# Patient Record
Sex: Female | Born: 1950
Health system: Southern US, Community
[De-identification: ages and names within clinical notes are randomized; demographics above are authoritative.]

## PROBLEM LIST (undated history)

## (undated) DIAGNOSIS — M810 Age-related osteoporosis without current pathological fracture: Secondary | ICD-10-CM

## (undated) DIAGNOSIS — M7989 Other specified soft tissue disorders: Secondary | ICD-10-CM

## (undated) DIAGNOSIS — R112 Nausea with vomiting, unspecified: Secondary | ICD-10-CM

## (undated) DIAGNOSIS — I739 Peripheral vascular disease, unspecified: Secondary | ICD-10-CM

## (undated) DIAGNOSIS — R519 Headache, unspecified: Secondary | ICD-10-CM

## (undated) DIAGNOSIS — M1990 Unspecified osteoarthritis, unspecified site: Secondary | ICD-10-CM

## (undated) DIAGNOSIS — Z8719 Personal history of other diseases of the digestive system: Secondary | ICD-10-CM

## (undated) DIAGNOSIS — M419 Scoliosis, unspecified: Secondary | ICD-10-CM

## (undated) DIAGNOSIS — Z87898 Personal history of other specified conditions: Secondary | ICD-10-CM

## (undated) DIAGNOSIS — Z9889 Other specified postprocedural states: Secondary | ICD-10-CM

## (undated) DIAGNOSIS — R51 Headache: Secondary | ICD-10-CM

## (undated) DIAGNOSIS — Z8489 Family history of other specified conditions: Secondary | ICD-10-CM

## (undated) DIAGNOSIS — R42 Dizziness and giddiness: Secondary | ICD-10-CM

## (undated) DIAGNOSIS — K219 Gastro-esophageal reflux disease without esophagitis: Secondary | ICD-10-CM

## (undated) HISTORY — PX: DEEP AXILLARY SENTINEL NODE BIOPSY / EXCISION: SUR130

## (undated) HISTORY — DX: Gastro-esophageal reflux disease without esophagitis: K21.9

## (undated) HISTORY — PX: JOINT REPLACEMENT: SHX530

## (undated) HISTORY — PX: BACK SURGERY: SHX140

## (undated) HISTORY — DX: Other specified soft tissue disorders: M79.89

## (undated) HISTORY — PX: OTHER SURGICAL HISTORY: SHX169

## (undated) HISTORY — PX: MENISCUS REPAIR: SHX5179

## (undated) HISTORY — DX: Unspecified osteoarthritis, unspecified site: M19.90

## (undated) HISTORY — PX: HEMORROIDECTOMY: SUR656

---

## 1990-10-06 HISTORY — PX: CHOLECYSTECTOMY: SHX55

## 1995-10-07 HISTORY — PX: BREAST REDUCTION SURGERY: SHX8

## 1996-10-06 HISTORY — PX: REDUCTION MAMMAPLASTY: SUR839

## 2006-10-06 HISTORY — PX: LASER ABLATION: SHX1947

## 2007-01-21 ENCOUNTER — Emergency Department (HOSPITAL_COMMUNITY): Admission: EM | Admit: 2007-01-21 | Discharge: 2007-01-21 | Payer: Self-pay | Admitting: Emergency Medicine

## 2007-01-21 ENCOUNTER — Encounter: Payer: Self-pay | Admitting: Vascular Surgery

## 2007-01-21 ENCOUNTER — Ambulatory Visit: Payer: Self-pay | Admitting: Vascular Surgery

## 2007-03-10 ENCOUNTER — Emergency Department (HOSPITAL_COMMUNITY): Admission: EM | Admit: 2007-03-10 | Discharge: 2007-03-10 | Payer: Self-pay | Admitting: Emergency Medicine

## 2007-03-10 ENCOUNTER — Encounter (INDEPENDENT_AMBULATORY_CARE_PROVIDER_SITE_OTHER): Payer: Self-pay | Admitting: Emergency Medicine

## 2007-03-10 ENCOUNTER — Ambulatory Visit: Payer: Self-pay | Admitting: Vascular Surgery

## 2007-04-05 ENCOUNTER — Ambulatory Visit: Payer: Self-pay | Admitting: Vascular Surgery

## 2007-06-03 ENCOUNTER — Other Ambulatory Visit: Admission: RE | Admit: 2007-06-03 | Discharge: 2007-06-03 | Payer: Self-pay | Admitting: Family Medicine

## 2007-06-16 ENCOUNTER — Encounter: Admission: RE | Admit: 2007-06-16 | Discharge: 2007-06-16 | Payer: Self-pay | Admitting: *Deleted

## 2007-07-23 ENCOUNTER — Ambulatory Visit: Payer: Self-pay | Admitting: Vascular Surgery

## 2008-02-18 ENCOUNTER — Ambulatory Visit: Payer: Self-pay | Admitting: Vascular Surgery

## 2008-03-28 ENCOUNTER — Encounter: Admission: RE | Admit: 2008-03-28 | Discharge: 2008-03-28 | Payer: Self-pay | Admitting: Family Medicine

## 2008-06-30 ENCOUNTER — Ambulatory Visit: Payer: Self-pay | Admitting: Vascular Surgery

## 2008-08-01 ENCOUNTER — Encounter: Admission: RE | Admit: 2008-08-01 | Discharge: 2008-08-01 | Payer: Self-pay | Admitting: Family Medicine

## 2009-08-02 ENCOUNTER — Encounter: Admission: RE | Admit: 2009-08-02 | Discharge: 2009-08-02 | Payer: Self-pay | Admitting: Family Medicine

## 2009-10-18 ENCOUNTER — Ambulatory Visit (HOSPITAL_COMMUNITY): Admission: RE | Admit: 2009-10-18 | Discharge: 2009-10-18 | Payer: Self-pay | Admitting: Gastroenterology

## 2010-07-17 ENCOUNTER — Encounter: Admission: RE | Admit: 2010-07-17 | Discharge: 2010-07-17 | Payer: Self-pay | Admitting: Family Medicine

## 2010-08-06 ENCOUNTER — Encounter: Admission: RE | Admit: 2010-08-06 | Discharge: 2010-08-06 | Payer: Self-pay | Admitting: Family Medicine

## 2010-08-09 ENCOUNTER — Encounter: Admission: RE | Admit: 2010-08-09 | Discharge: 2010-08-09 | Payer: Self-pay | Admitting: Rheumatology

## 2011-02-18 NOTE — Assessment & Plan Note (Signed)
OFFICE VISIT   Meghan Avila, Meghan Avila  DOB:  10-Jan-1951                                       02/18/2008  MVHQI#:[id number]   The patient presents today for continuing discussion regarding swelling  and discomfort in both lower extremities.  I have seen her in the past.  She has had prior treatment of her left great saphenous vein in [place]  on 12/25/2006.  I had seen her for a duplex followup of this and she did  have reasonable treatment.  She did have a small area of recannulization  at the level of her left knee.  She complains now of bilateral leg  swelling, she also reports cramping sensation in her left lateral thigh  and right popliteal area.  She does not have any evidence of venous  varicosities and does have moderate pitting edema bilaterally.  She has  not been wearing her compression garments and has not been taking  diuretic therapy.  Her history is otherwise unchanged.   PHYSICAL EXAM:  She does have 2+ dorsalis pedis pulses bilaterally.  She  underwent duplex imaging by me and this revealed no evidence of any  varicosities in the area of her concern in her lateral left thigh.  She  had normal appearing popliteal vessel on the right, normal appearing  popliteal vein on the right and normal small saphenous vein on the  right.  I explained the significance of this to Meghan Avila.  I feel that  she is going to require diuretic treatment for her swelling.  She is  somewhat resistant to this.  I also explained the importance of her  compression garments.  She is disappointed in not having better relief  following her treatment from her vein treatment in [place].  I explained  it is somewhat difficult to determine what the outcome will be with  these procedures at times but that she does appear to have a reasonable  outcome from the ablation.  She will continue her followup with her  medical doctor.  She is seeing Dr. Gweneth Dimitri since Dr. Vedia Coffer has  left the area.  She will see [place] again on an as-needed basis.   Larina Earthly, M.D.  Electronically Signed   TFE/MEDQ  D:  02/18/2008  T:  02/21/2008  Job:  [id number]   cc:   Carma Leaven, DO  Pam Drown, M.D.

## 2011-02-18 NOTE — Procedures (Signed)
LOWER EXTREMITY VENOUS REFLUX EXAM   INDICATION:  Status post laser ablation of left GSV and SSV, still with  pain and swelling.   EXAM:  Using color-flow imaging and pulse Doppler spectral analysis, the  left common femoral, superficial femoral, popliteal, posterior tibial,  greater and lesser saphenous veins are evaluated.  There is no evidence  suggesting deep venous insufficiency in the left lower extremity.   The left saphenofemoral junction is competent.  The left GSV is not  competent in a short segment near the knee.   The left proximal short saphenous vein demonstrates competency.   GSV Diameter (used if found to be incompetent only)                                            Right    Left  Proximal Greater Saphenous Vein           cm       cm  Proximal-to-mid-thigh                     cm       cm  Mid thigh                                 cm       cm  Mid-distal thigh                          cm       cm  Distal thigh                              cm       cm  Knee                                      cm       cm   IMPRESSION:  1. The left greater saphenous vein reflux is identified near the knee,      however, saphenofemoral junction appears within normal limits.  2. Normal flow was noted at the saphenofemoral junction with the      proximal greater saphenous vein showing evidence of ablation and      only small branches visualized until near the knee where a short      segment with reflux was noted before it became more tortuous and      branching again.  3. The left greater saphenous vein is not aneurysmal.  4. The left greater saphenous vein is not tortuous.  5. The deep venous system is competent.  6. The left lesser saphenous vein is competent and appears to connect      into a vein of Giac[place]i.  7. No DVT noted in the left lower extremity.  8. Minimal chronic thrombus noted in a left greater saphenous vein and      varicose veins status post  ablation.   ___________________________________________  Larina Earthly, M.D.   AS/MEDQ  D:  07/23/2007  T:  07/25/2007  Job:  [id number]

## 2011-02-18 NOTE — Assessment & Plan Note (Signed)
OFFICE VISIT   Slager, Alyssia L  DOB:  Oct 12, 1950                                       07/23/2007  [id number]#:[id number]   The patient presents today for continued evaluation of her left leg  swelling.  I had seen her initially in 03/2007.  She had undergone a  left great and small saphenous vein ablation by Dr. Zackery Barefoot in  [place].  She continues to have swelling in her left leg and reports  this is not as significant as it was preprocedurally.  She does not have  any specific severe pain associated with this, but is irritated by the  continued swelling.  She is very compliant with knee-high graduated  compression stockings.   PHYSICAL EXAMINATION:  This is unchanged.  She does have edema more so  on her left than right leg.  She has 2+ dorsalis pedis pulses  bilaterally.   I have recommended that we proceed with a form duplex to rule out any  deep venous pathology and to recheck her surface veins.  This did reveal  great saphenous was ablated in the thigh with the slight veins still  patent at the saphenofemoral junction.  Fortunately, her deep venous  system was patent with no evidence of occlusion or valvular  incompetence.  I have reassured the patient with this.  I suspect that  she will continue to have swelling.  I am unclear as to the etiology of  this.  She will continue her compression garments, and will see [place] on an  as needed basis.   Larina Earthly, M.D.  Electronically Signed   TFE/MEDQ  D:  07/23/2007  T:  07/27/2007  Job:  [id number]

## 2011-02-18 NOTE — Procedures (Signed)
DUPLEX DEEP VENOUS EXAM - LOWER EXTREMITY   INDICATION:  Bilateral lower extremity edema.   HISTORY:  Edema:  Chronic bilateral lower extremity edema.  Trauma/Surgery:  Laser ablation of left greater saphenous vein in  [place].  Pain:  Bilateral lower extremity aching.  PE:  No.  Previous DVT:  No.  Anticoagulants:  No.  Other:  The patient was prescribed diuretics which she does not take.  She does not wear compression stockings.   DUPLEX EXAM:                CFV   SFV   PopV  PTV    GSV                R  L  R  L  R  L  R   L  R  L  Thrombosis    o  o  o  o  o  o  o   o  o  o  Spontaneous   +  +  +  +  +  +  +   +  +  +  Phasic        +  +  +  +  +  +  +   +  +  +  Augmentation  +  +  +  +  +  +  +   +  +  +  Compressible  +  +  +  +  +  +  +   +  +  +  Competent     +  +  +  +  +  +  +   +  +  +   Legend:  + - yes  o - no  p - partial  D - decreased    IMPRESSION:  1. No evidence of deep vein thrombus, superficial vein thrombus or      Baker's cyst bilaterally.  2. No evidence of significant venous reflux bilaterally.  3. Remaining portion of the left saphenofemoral junction is patent      with minimal reflux.      _____________________________  Larina Earthly, M.D.   MC/MEDQ  D:  06/30/2008  T:  07/01/2008  Job:  [id number]

## 2011-02-18 NOTE — Consult Note (Signed)
NEW PATIENT CONSULTATION   Offer, Meghan Avila  DOB:  01/28/1951                                       04/05/2007  [id number]#:[id number]   [name] presents today for evaluation of venous pathology.  She is  a 60 year old white female who is status post left great and small  saphenous vein laser ablation in March 2008, by Dr. Zackery Barefoot in  [place].  She has since moved to [place].  She has had continued  episodes of swelling in both legs and has had two venous duplexes to  evaluate this, to determine if she had deep venous thrombosis.  I have  copies of both of these, and both were negative.  She does have  continued difficulty with bilateral lower extremity swelling.  She  reports this as more pronounced, since her procedure on the left leg.  She denies any history of ulceration or hemorrhage.  Indication for her  laser ablation was for leg swelling.   PHYSICAL EXAMINATION:  GENERAL:  She does have pitting edema  bilaterally, slightly more so on the left leg than the right leg.  She  underwent hand-held duplex by me, showing successful sclerosing and  ablation of her left great and small saphenous vein on the left.  On the  right, she does have a large great saphenous vein with reflux present.  I had a long discussion with Ms. Kullman.  I had not seen any evidence of  any complications following her ablation.  I explained that, since she  is continuing to have bilateral leg swelling that the swelling may have  not been related to greater or lesser saphenous vein hypertension.  I  have recommended continued elevation, diuretic treatment, as she is on  Lasix 20 mg daily and compression when she can.  I do not feel that she  is in the risk for significant medical complications associated with  this.  Also of note, she does have numbness around the left calf, and I  explained that this is  frequently the case, following ablation of the small saphenous vein.   I  will see her again in 3 months for a continued discussion.   Larina Earthly, M.D.  Electronically Signed   TFE/MEDQ  D:  04/05/2007  T:  04/06/2007  Job:  [id number]   cc:   Carma Leaven, DO

## 2011-04-23 ENCOUNTER — Other Ambulatory Visit: Payer: Self-pay | Admitting: Family Medicine

## 2011-04-23 DIAGNOSIS — N644 Mastodynia: Secondary | ICD-10-CM

## 2011-05-01 ENCOUNTER — Ambulatory Visit
Admission: RE | Admit: 2011-05-01 | Discharge: 2011-05-01 | Disposition: A | Discharge status: Home / Self Care | Payer: 59 | Source: Ambulatory Visit | Attending: Family Medicine | Admitting: Family Medicine

## 2011-05-01 DIAGNOSIS — N644 Mastodynia: Secondary | ICD-10-CM

## 2011-06-16 ENCOUNTER — Ambulatory Visit (HOSPITAL_COMMUNITY)
Admission: RE | Admit: 2011-06-16 | Discharge: 2011-06-16 | Disposition: A | Discharge status: Home / Self Care | Payer: 59 | Source: Ambulatory Visit | Attending: Orthopedic Surgery | Admitting: Orthopedic Surgery

## 2011-06-16 DIAGNOSIS — M79609 Pain in unspecified limb: Secondary | ICD-10-CM

## 2011-06-16 DIAGNOSIS — M7989 Other specified soft tissue disorders: Secondary | ICD-10-CM

## 2011-07-21 ENCOUNTER — Ambulatory Visit (INDEPENDENT_AMBULATORY_CARE_PROVIDER_SITE_OTHER): Payer: Self-pay | Admitting: General Surgery

## 2011-07-31 ENCOUNTER — Ambulatory Visit (INDEPENDENT_AMBULATORY_CARE_PROVIDER_SITE_OTHER): Payer: Self-pay | Admitting: General Surgery

## 2011-08-08 ENCOUNTER — Ambulatory Visit (INDEPENDENT_AMBULATORY_CARE_PROVIDER_SITE_OTHER): Payer: Self-pay | Admitting: General Surgery

## 2011-08-11 ENCOUNTER — Ambulatory Visit (INDEPENDENT_AMBULATORY_CARE_PROVIDER_SITE_OTHER): Payer: Self-pay | Admitting: General Surgery

## 2011-08-11 ENCOUNTER — Ambulatory Visit (INDEPENDENT_AMBULATORY_CARE_PROVIDER_SITE_OTHER): Payer: 59 | Admitting: General Surgery

## 2011-08-11 ENCOUNTER — Encounter (INDEPENDENT_AMBULATORY_CARE_PROVIDER_SITE_OTHER): Payer: Self-pay | Admitting: General Surgery

## 2011-08-11 VITALS — BP 128/86 | HR 64 | Temp 97.8°F | Resp 16 | Ht 61.0 in | Wt 152.1 lb

## 2011-08-11 DIAGNOSIS — I890 Lymphedema, not elsewhere classified: Secondary | ICD-10-CM | POA: Insufficient documentation

## 2011-08-11 NOTE — Patient Instructions (Signed)
Breast Problems and Self-Exam Completing monthly breast exams may pick up problems early and save lives. There can be numerous causes of swelling, tenderness or lumps in the breasts. Some of these causes are:   Fibrocystic breast syndrome (noncancerous lumps). This is the most common cause of lumps in the breast.   Fibroadenoma breast tumors of unknown cause. These are noncancerous (benign) lumps.   Benign fatty tumors (lipomas).   Cancer of the breast.  By doing monthly breast exams, you get to know how your breasts feel and how they can change from month to month. This allows you to notice changes early. It can also offer you some reassurance that your breast health is good.  BREAST SELF-EXAM There are a few points to follow when doing a thorough breast exam. The best time to examine your breasts is 5 to 7 days after your menstrual period is over. During menstruation, the breasts are lumpier, and it may be more difficult to pick up changes. If you do not menstruate, have reached menopause, or had a hysterectomy (uterus removal), examine your breasts the first day of every month. After 3 to 4 months, you will become more familiar with the variations of your breasts and more comfortable with the exam.  Perform your breast exam monthly. Keep a written record with breast changes or normal findings for each breast. This makes it easier to be sure of changes, so you do not need to depend only on memory for size, tenderness, or location. Try to do the exam at the same time each month, and write down where you are in your menstrual cycle, if you are still menstruating.   Look at your breasts. Stand in front of a mirror with your hands clasped behind your head. Tighten your chest muscles and look for asymmetry. This means a difference in shape or contour from one breast to the other, such as puckers, dips or bumps. Also, look for skin changes.   Lean forward with your hands on your hips. Again, look for  symmetry and skin changes.   While showering, soap the breasts. Then, carefully feel the breasts with your fingertips, while holding the other arm (on the side of the breast you are examining) over your head. Do this with each breast, carefully feeling for lumps or changes. Typically, a circular motion with moderate fingertip pressure should be used.   Repeat this exam while lying on your back. Put your arm behind your head and a pillow under your shoulders. Again, use your fingertips to examine both breasts, feeling for lumps and thickening. Begin at the top of your breast, and go clockwise around the whole breast.   At the end of your exam, gently squeeze each nipple to see if there is any drainage of fluids. Look for nipple changes, dimpling, or redness.   Lastly, examine the upper chest and collarbone (clavicle) areas, and in your armpits.  It is not necessary to be alarmed if you find a breast lump. Most of them are not cancerous. However, it is necessary to see your caregiver if a lump is found, in order to have it looked at. Document Released: 09/22/2005 Document Revised: 06/04/2011 Document Reviewed: 01/09/2009 [employer] Patient Information 2012 [employer]. 

## 2011-08-11 NOTE — Progress Notes (Signed)
Subjective:     Patient ID: LIANA CAMERER, female   DOB: 05/03/1951, 60 y.o.   MRN: [id number]  HPI This is a 60 year old female who presents with a history of what sounds like 2 lymph node biopsies of her right axilla 2005 2007 that were done in [place]. By report these were benign. She had a history of a breast reduction in 1997 also. Unsure of exactly what the indications for these were but she does not have a history of breast cancer. She is at her yearly mammograms which are up-to-date and apparently are also normal. She also started developing some swelling in her hand in June. This is associated with some swelling upper arm a little bit also. She also has some pain and tenderness in her right axilla around towards her right breast. She feels no masses and otherwise has no complaints. Her sensation is normal and his arm. She is also being evaluated right now for some right shoulder problems. She comes in today to discuss her right arm swelling. She does apparently have a history of an ultrasound that shows no deep venous thrombosis of her right upper extremity.  Review of Systems  Constitutional: Negative for fever, chills and unexpected weight change.  HENT: Negative for hearing loss, congestion, sore throat, trouble swallowing and voice change.   Eyes: Negative for visual disturbance.  Respiratory: Negative for cough and wheezing.   Cardiovascular: Positive for leg swelling. Negative for chest pain and palpitations.  Gastrointestinal: Negative for nausea, vomiting, abdominal pain, diarrhea, constipation, blood in stool, abdominal distention and anal bleeding.  Genitourinary: Negative for hematuria, vaginal bleeding and difficulty urinating.  Musculoskeletal: Positive for arthralgias.  Skin: Negative for rash and wound.  Neurological: Negative for seizures, syncope and headaches.  Hematological: Negative for adenopathy. Does not bruise/bleed easily.  Psychiatric/Behavioral: Negative for  confusion.       Objective:   Physical Exam  Constitutional: She appears well-developed and well-nourished.  Neck: Neck supple.  Pulmonary/Chest: Right breast exhibits no inverted nipple, no mass, no nipple discharge, no skin change and no tenderness. Left breast exhibits no inverted nipple, no mass, no nipple discharge, no skin change and no tenderness. Breasts are symmetrical.    Musculoskeletal:       Some mild right hand edema otherwise appears similar to LUE, no motor or sensory deficits, pulses palpable  Lymphadenopathy:    She has no cervical adenopathy.    She has no axillary adenopathy.       Assessment:     RUE lymphedema    Plan:        I think she does have lymphedema of her right arm. I'm going to refer her to see the lymphedema therapist. We discussed the diagnosis of lymphedema as well as at the surgical repair for this. Her treatment is going to be with a therapist. We discussed this was a lifelong problem. I do think it is okay for her to get her shoulder evaluated however Dr. Rennis Chris sees fit. I don't need to perform any other further imaging. I told her I will see her back as needed. I encourage her to do her on breast self exams as well as following up on her yearly mammograms.

## 2011-08-13 ENCOUNTER — Ambulatory Visit: Payer: 59 | Attending: General Surgery | Admitting: Physical Therapy

## 2011-08-13 DIAGNOSIS — IMO0001 Reserved for inherently not codable concepts without codable children: Secondary | ICD-10-CM | POA: Insufficient documentation

## 2011-08-13 DIAGNOSIS — I890 Lymphedema, not elsewhere classified: Secondary | ICD-10-CM | POA: Insufficient documentation

## 2011-09-03 ENCOUNTER — Ambulatory Visit: Payer: 59 | Admitting: Physical Therapy

## 2011-09-10 ENCOUNTER — Ambulatory Visit: Payer: 59 | Attending: General Surgery | Admitting: Physical Therapy

## 2011-09-10 DIAGNOSIS — I890 Lymphedema, not elsewhere classified: Secondary | ICD-10-CM | POA: Insufficient documentation

## 2011-09-10 DIAGNOSIS — IMO0001 Reserved for inherently not codable concepts without codable children: Secondary | ICD-10-CM | POA: Insufficient documentation

## 2012-03-26 ENCOUNTER — Other Ambulatory Visit: Payer: Self-pay | Admitting: Family Medicine

## 2012-03-26 DIAGNOSIS — Z1231 Encounter for screening mammogram for malignant neoplasm of breast: Secondary | ICD-10-CM

## 2012-04-06 ENCOUNTER — Ambulatory Visit
Admission: RE | Admit: 2012-04-06 | Discharge: 2012-04-06 | Disposition: A | Discharge status: Home / Self Care | Payer: 59 | Source: Ambulatory Visit | Attending: Family Medicine | Admitting: Family Medicine

## 2012-04-06 DIAGNOSIS — Z1231 Encounter for screening mammogram for malignant neoplasm of breast: Secondary | ICD-10-CM

## 2013-05-06 ENCOUNTER — Other Ambulatory Visit: Payer: Self-pay | Admitting: Family Medicine

## 2013-05-06 DIAGNOSIS — N644 Mastodynia: Secondary | ICD-10-CM

## 2013-05-27 ENCOUNTER — Ambulatory Visit
Admission: RE | Admit: 2013-05-27 | Discharge: 2013-05-27 | Disposition: A | Discharge status: Home / Self Care | Payer: 59 | Source: Ambulatory Visit | Attending: Family Medicine | Admitting: Family Medicine

## 2013-05-27 DIAGNOSIS — N644 Mastodynia: Secondary | ICD-10-CM

## 2014-04-27 ENCOUNTER — Other Ambulatory Visit: Payer: Self-pay

## 2014-04-27 DIAGNOSIS — Z1231 Encounter for screening mammogram for malignant neoplasm of breast: Secondary | ICD-10-CM

## 2014-06-01 ENCOUNTER — Ambulatory Visit
Admission: RE | Admit: 2014-06-01 | Discharge: 2014-06-01 | Disposition: A | Discharge status: Home / Self Care | Payer: 59 | Source: Ambulatory Visit

## 2014-06-01 DIAGNOSIS — Z1231 Encounter for screening mammogram for malignant neoplasm of breast: Secondary | ICD-10-CM

## 2014-06-29 ENCOUNTER — Other Ambulatory Visit: Payer: Self-pay | Admitting: *Deleted

## 2014-06-29 DIAGNOSIS — I83893 Varicose veins of bilateral lower extremities with other complications: Secondary | ICD-10-CM

## 2014-06-29 DIAGNOSIS — R609 Edema, unspecified: Secondary | ICD-10-CM

## 2014-07-20 ENCOUNTER — Encounter: Payer: Self-pay | Admitting: Vascular Surgery

## 2014-07-21 ENCOUNTER — Ambulatory Visit (INDEPENDENT_AMBULATORY_CARE_PROVIDER_SITE_OTHER): Payer: 59 | Admitting: Vascular Surgery

## 2014-07-21 ENCOUNTER — Ambulatory Visit (HOSPITAL_COMMUNITY)
Admission: RE | Admit: 2014-07-21 | Discharge: 2014-07-21 | Disposition: A | Discharge status: Home / Self Care | Payer: 59 | Source: Ambulatory Visit | Attending: Vascular Surgery | Admitting: Vascular Surgery

## 2014-07-21 ENCOUNTER — Encounter: Payer: Self-pay | Admitting: Vascular Surgery

## 2014-07-21 VITALS — BP 121/80 | HR 84 | Ht 61.0 in | Wt 154.6 lb

## 2014-07-21 DIAGNOSIS — I872 Venous insufficiency (chronic) (peripheral): Secondary | ICD-10-CM | POA: Insufficient documentation

## 2014-07-21 DIAGNOSIS — R609 Edema, unspecified: Secondary | ICD-10-CM | POA: Diagnosis not present

## 2014-07-21 DIAGNOSIS — I8393 Asymptomatic varicose veins of bilateral lower extremities: Secondary | ICD-10-CM | POA: Diagnosis not present

## 2014-07-21 NOTE — Progress Notes (Signed)
Referred by:  Kathyrn Lass, MD [place], [place] [address]  Reason for referral: Swollen B leg  History of Present Illness  Meghan Avila is a 63 y.o. (08-11-1951) female who presents with chief complaint: swollen leg.  Patient notes, onset of swelling years ago, associated with no obvious trigger.  She previously had ablation of her L GSV and SSV at a practice in [place].  She has seen Dr. Donnetta Hutching for B leg swelling.  Recently her swelling has worsened and she now has pitting despite use of knee high compression stockings.  The patient has had no history of DVT, no history of pregnancy, known history of varicose vein, no history of venous stasis ulcers, known history of  Arm lymphedema and no history of skin changes in lower legs.  There is no family history of venous disorders.  The patient has used knee high compression stockings in the past.  Past Medical History  Diagnosis Date  . Arthritis   . GERD (gastroesophageal reflux disease)   . Leg swelling   . Hand swelling     related to lymph nodes     Past Surgical History  Procedure Laterality Date  . Hemorroidectomy  2002, 2005  . Scoliosis  1970, 1972  . Cholecystectomy  1992  . Breast reduction surgery  1997  . Laser ablation  2008    left leg   . Deep axillary sentinel node biopsy / excision      x2    History   Social History  . Marital Status: Married    Spouse Name: N/A    Number of Children: N/A  . Years of Education: N/A   Occupational History  . Not on file.   Social History Main Topics  . Smoking status: Former Research scientist (life sciences)  . Smokeless tobacco: Never Used  . Alcohol Use: No  . Drug Use: No  . Sexual Activity: Not on file   Other Topics Concern  . Not on file   Social History Narrative  . No narrative on file    Family History  Problem Relation Age of Onset  . Varicose Veins Mother   . Cancer Sister   . Heart disease Sister     before age 6  . Hypertension Sister   . Varicose  Veins Sister   . Heart attack Sister   . Hyperlipidemia Brother    Current Outpatient Prescriptions  Medication Sig Dispense Refill  . cholecalciferol (VITAMIN D) 1000 UNITS tablet Take 1,000 Units by mouth daily.        . Ibuprofen (ADVIL PO) Take by mouth 2 (two) times daily.         No current facility-administered medications for this visit.    Allergies  Allergen Reactions  . Prednisone     Nausea and severe vomiting     REVIEW OF SYSTEMS:  (Positives checked otherwise negative)  CARDIOVASCULAR:  []  chest pain, []  chest pressure, []  palpitations, []  shortness of breath when laying flat, []  shortness of breath with exertion,  []  pain in feet when walking, [x]  pain in feet when laying flat, []  history of blood clot in veins (DVT), []  history of phlebitis, [x]  swelling in legs, [x]  varicose veins  PULMONARY:  []  productive cough, []  asthma, []  wheezing  NEUROLOGIC:  []  weakness in arms or legs, []  numbness in arms or legs, []  difficulty speaking or slurred speech, []  temporary loss of vision in one eye, []  dizziness  HEMATOLOGIC:  []   bleeding problems, []  problems with blood clotting too easily  MUSCULOSKEL:  []  joint pain, []  joint swelling  GASTROINTEST:  []  vomiting blood, []  blood in stool     GENITOURINARY:  []  burning with urination, []  blood in urine  PSYCHIATRIC:  []  history of major depression  INTEGUMENTARY:  []  rashes, []  ulcers  CONSTITUTIONAL:  []  fever, []  chills   Physical Examination Filed Vitals:   07/21/14 1245  BP: 121/80  Pulse: 84  Height: 5\' 1"  (1.549 m)  Weight: 154 lb 9.6 oz (70.126 kg)  SpO2: 97%   Body mass index is 29.23 kg/(m^2).  General: A&O x 3, WDWN  Head: [place]/AT  Ear/Nose/Throat: Hearing grossly intact, nares w/o erythema or drainage, oropharynx w/o Erythema/Exudate  Eyes: PERRLA, EOMI  Neck: Supple, no nuchal rigidity, no palpable LAD  Pulmonary: Sym exp, good air movt, CTAB, no rales, rhonchi, & wheezing  Cardiac:  RRR, Nl S1, S2, no Murmurs, rubs or gallops  Vascular: Vessel Right Left  Radial Palpable Palpable  Brachial Palpable Palpable  Carotid Palpable, without bruit Palpable, without bruit  Aorta Not palpable N/A  Femoral Palpable Palpable  Popliteal Not palpable Not palpable  PT Palpable Palpable  DP Palpable Palpable   Gastrointestinal: soft, NTND, -G/R, - HSM, - masses, - CVAT B  Musculoskeletal: M/S 5/5 throughout , Extremities without ischemic changes , BLE 2+ edema, +varicose veins and spider veins B, no LDS  Neurologic: CN 2-12 intact grossly intact, Pain and light touch intact in extremities , Motor exam as listed above  Psychiatric: Judgment intact, Mood & affect appropriate for pt's clinical situation  Dermatologic: See M/S exam for extremity exam, no rashes otherwise noted  Lymph : No Cervical, Axillary, or Inguinal lymphadenopathy   Non-Invasive Vascular Imaging  BLE Venous Insufficiency Duplex (Date: 07/21/2014):   RLE: no DVT and SVT, no GSV reflux, + deep venous reflux: CFV  LLE: no DVT and SVT, no GSV: stripped, no SSV reflux, + deep venous reflux: CFV  Outside Studies/Documentation 6 pages of outside documents were reviewed including: outpatient PCP chart.  Medical Decision Making  Meghan Avila is a 63 y.o. female who presents with: BLE chronic venous insufficiency (C2), history of L GSV intervention (likely stripping).   I suspect the patient is somewhat confused on her prior interventions as he L GSV appears stripped and there appears to be a competent L SSV.  Her venous reflux exam is somewhat underwhelming, suggesting there is a likely second etiology for her bilateral leg swelling.  In reviewing Dr. Luther Parody previous notes, she also had leg swelling previously without any evidence of deep venous reflux, which suggests again a medical etiology for her leg swelling.  Based on the patient's history and examination, I recommend: compressive therapy.  I  discussed with the patient the use of her 20-30 mm thigh high compression stockings and need for such.  Thank you for allowing [place] to participate in this patient's care.  Adele Barthel, MD [facility] Office: [phone] Pager: [phone]  07/21/2014, 1:28 PM

## 2014-08-01 ENCOUNTER — Ambulatory Visit (INDEPENDENT_AMBULATORY_CARE_PROVIDER_SITE_OTHER): Payer: 59 | Admitting: Cardiovascular Disease

## 2014-08-01 ENCOUNTER — Encounter: Payer: Self-pay | Admitting: Cardiovascular Disease

## 2014-08-01 VITALS — BP 130/64 | HR 74 | Ht 62.0 in | Wt 152.0 lb

## 2014-08-01 DIAGNOSIS — I872 Venous insufficiency (chronic) (peripheral): Secondary | ICD-10-CM

## 2014-08-01 DIAGNOSIS — R0602 Shortness of breath: Secondary | ICD-10-CM

## 2014-08-01 DIAGNOSIS — R609 Edema, unspecified: Secondary | ICD-10-CM

## 2014-08-01 NOTE — Progress Notes (Signed)
Primary care physician: Dr. Kathyrn Lass  HPI  This is a pleasant 63 year old female who was referred for evaluation of leg edema and mild shortness of breath. She has no previous cardiac history. She reports history of CVA, osteoarthritis and scoliosis. She has family history of prolonged QT syndrome. Her sister suffered from cardiac arrest due to that and had an ICD placement. There is also family history of SVT and pulmonary hypertension. She is not a smoker. Her main issue seems to be bilateral leg edema with previous procedure for venous insufficiency in [place]. She reports worsening symptoms since then. The edema is worse on the left side than the right side. She was seen by Dr. Bridgett Larsson . Venous evaluation showed no significant reflux or insufficiency to explain the degree of edema. Thus, it was recommended to obtain cardiac workup for further evaluation. She reports chronic exertional dyspnea but no chest pain, orthopnea or PND. She had routine labs performed recently which showed normal BNP. The rest of her labs were also normal including CBC and basic metabolic profile. Albumin level was normal. She has been wearing thigh-high support stockings with improvement.  Allergies  Allergen Reactions  . Prednisone     Nausea and severe vomiting      Current Outpatient Prescriptions on File Prior to Visit  Medication Sig Dispense Refill  . Ibuprofen (ADVIL PO) Take by mouth 2 (two) times daily.         No current facility-administered medications on file prior to visit.     Past Medical History  Diagnosis Date  . Arthritis   . GERD (gastroesophageal reflux disease)   . Leg swelling   . Hand swelling     related to lymph nodes      Past Surgical History  Procedure Laterality Date  . Hemorroidectomy  2002, 2005  . Scoliosis  1970, 1972  . Cholecystectomy  1992  . Breast reduction surgery  1997  . Laser ablation  2008    left leg   . Deep axillary sentinel node biopsy / excision       x2     Family History  Problem Relation Age of Onset  . Varicose Veins Mother   . Cancer Sister   . Heart disease Sister     before age 68  . Hypertension Sister   . Varicose Veins Sister   . Heart attack Sister   . Hyperlipidemia Brother      History   Social History  . Marital Status: Married    Spouse Name: N/A    Number of Children: N/A  . Years of Education: N/A   Occupational History  . Not on file.   Social History Main Topics  . Smoking status: Former Research scientist (life sciences)  . Smokeless tobacco: Never Used  . Alcohol Use: No  . Drug Use: No  . Sexual Activity: Not on file   Other Topics Concern  . Not on file   Social History Narrative  . No narrative on file     ROS A 10 point review of system was performed. It is negative other than that mentioned in the history of present illness.   PHYSICAL EXAM   BP 130/64  Pulse 74  Ht 5\' 2"  (1.575 m)  Wt 152 lb (68.947 kg)  BMI 27.79 kg/m2 Constitutional: She is oriented to person, place, and time. She appears well-developed and well-nourished. No distress.  HENT: No nasal discharge.  Head: Normocephalic and atraumatic.  Eyes: Pupils are equal and  round. No discharge.  Neck: Normal range of motion. Neck supple. No JVD present. No thyromegaly present.  Cardiovascular: Normal rate, regular rhythm, normal heart sounds. Exam reveals no gallop and no friction rub. No murmur heard.  Pulmonary/Chest: Effort normal and breath sounds normal. No stridor. No respiratory distress. She has no wheezes. She has no rales. She exhibits no tenderness.  Abdominal: Soft. Bowel sounds are normal. She exhibits no distension. There is no tenderness. There is no rebound and no guarding.  Musculoskeletal: Normal range of motion. She exhibits no edema and no tenderness.  Neurological: She is alert and oriented to person, place, and time. Coordination normal.  Skin: Skin is warm and dry. No rash noted. She is not diaphoretic. No erythema. No  pallor.  Psychiatric: She has a normal mood and affect. Her behavior is normal. Judgment and thought content normal.     EKG: Normal sinus rhythm with low voltage.   ASSESSMENT AND PLAN

## 2014-08-01 NOTE — Patient Instructions (Signed)
Your physician has requested that you have an echocardiogram. Echocardiography is a painless test that uses sound waves to create images of your heart. It provides your doctor with information about the size and shape of your heart and how well your heart's chambers and valves are working. This procedure takes approximately one hour. There are no restrictions for this procedure.  Your physician recommends that you schedule a follow-up appointment as needed with Dr Fletcher Anon.

## 2014-08-03 NOTE — Assessment & Plan Note (Signed)
An echocardiogram was requested. She does have family history of prolonged QT syndrome. However, current EKG shows normal QT interval. She has no symptoms suggestive of arrhythmia.

## 2014-08-03 NOTE — Assessment & Plan Note (Signed)
I suspect that her leg edema is not due to cardiac condition. Physical exam does not show evidence of fluid overload. Also BNP was normal. However, given associated dyspnea, I requested an echocardiogram to evaluate LV systolic and diastolic function as well as pulmonary pressure given that she has family history of pulmonary hypertension. Continue support stockings.

## 2014-08-11 ENCOUNTER — Ambulatory Visit (HOSPITAL_COMMUNITY): Payer: 59 | Attending: Cardiovascular Disease | Admitting: Cardiology

## 2014-08-11 DIAGNOSIS — R609 Edema, unspecified: Secondary | ICD-10-CM

## 2014-08-11 DIAGNOSIS — Z87891 Personal history of nicotine dependence: Secondary | ICD-10-CM | POA: Diagnosis not present

## 2014-08-11 DIAGNOSIS — R0602 Shortness of breath: Secondary | ICD-10-CM | POA: Diagnosis present

## 2014-08-11 NOTE — Progress Notes (Signed)
Echo performed. 

## 2014-08-14 ENCOUNTER — Ambulatory Visit: Payer: 59 | Admitting: Cardiovascular Disease

## 2014-08-16 ENCOUNTER — Telehealth: Payer: Self-pay | Admitting: Cardiovascular Disease

## 2014-08-16 NOTE — Telephone Encounter (Signed)
Pt is aware of echo results. Copy of results mailed  to pt per her request, also results were routed  to Dr. Kathyrn Lass PCP.

## 2014-08-16 NOTE — Telephone Encounter (Signed)
New message ° ° ° ° ° °Want echo results °

## 2014-12-06 ENCOUNTER — Ambulatory Visit (HOSPITAL_COMMUNITY)
Admission: RE | Admit: 2014-12-06 | Discharge: 2014-12-06 | Disposition: A | Discharge status: Home / Self Care | Payer: 59 | Source: Ambulatory Visit | Attending: Cardiology | Admitting: Cardiology

## 2014-12-06 ENCOUNTER — Other Ambulatory Visit (HOSPITAL_COMMUNITY): Payer: Self-pay | Admitting: Specialist

## 2014-12-06 DIAGNOSIS — Z4789 Encounter for other orthopedic aftercare: Secondary | ICD-10-CM | POA: Insufficient documentation

## 2014-12-06 DIAGNOSIS — M7989 Other specified soft tissue disorders: Secondary | ICD-10-CM

## 2014-12-06 NOTE — Progress Notes (Signed)
Left Lower Ext. Venous Duplex Completed. Negative for DVT or SVT. Kamali Nephew, BS, RDMS, RVT  

## 2015-01-10 ENCOUNTER — Other Ambulatory Visit: Payer: Self-pay | Admitting: Gastroenterology

## 2015-02-05 ENCOUNTER — Emergency Department (HOSPITAL_COMMUNITY)
Admission: EM | Admit: 2015-02-05 | Discharge: 2015-02-06 | Disposition: A | Discharge status: Home / Self Care | Payer: 59 | Attending: Emergency Medicine | Admitting: Emergency Medicine

## 2015-02-05 ENCOUNTER — Encounter (HOSPITAL_COMMUNITY): Payer: Self-pay | Admitting: *Deleted

## 2015-02-05 DIAGNOSIS — R509 Fever, unspecified: Secondary | ICD-10-CM | POA: Diagnosis not present

## 2015-02-05 DIAGNOSIS — K219 Gastro-esophageal reflux disease without esophagitis: Secondary | ICD-10-CM | POA: Diagnosis not present

## 2015-02-05 DIAGNOSIS — R5381 Other malaise: Secondary | ICD-10-CM | POA: Diagnosis not present

## 2015-02-05 DIAGNOSIS — L03112 Cellulitis of left axilla: Secondary | ICD-10-CM | POA: Diagnosis not present

## 2015-02-05 DIAGNOSIS — L02412 Cutaneous abscess of left axilla: Secondary | ICD-10-CM | POA: Diagnosis not present

## 2015-02-05 DIAGNOSIS — Z79899 Other long term (current) drug therapy: Secondary | ICD-10-CM | POA: Insufficient documentation

## 2015-02-05 DIAGNOSIS — M1990 Unspecified osteoarthritis, unspecified site: Secondary | ICD-10-CM | POA: Diagnosis not present

## 2015-02-05 DIAGNOSIS — Z87891 Personal history of nicotine dependence: Secondary | ICD-10-CM | POA: Insufficient documentation

## 2015-02-05 LAB — CBC WITH DIFFERENTIAL/PLATELET
Basophils Absolute: 0 10*3/uL (ref 0.0–0.1)
Basophils Relative: 0 % (ref 0–1)
Eosinophils Absolute: 0 10*3/uL (ref 0.0–0.7)
Eosinophils Relative: 0 % (ref 0–5)
HCT: 34 % — ABNORMAL LOW (ref 36.0–46.0)
Hemoglobin: 11 g/dL — ABNORMAL LOW (ref 12.0–15.0)
Lymphocytes Relative: 12 % (ref 12–46)
Lymphs Abs: 1.5 10*3/uL (ref 0.7–4.0)
MCH: 26.2 pg (ref 26.0–34.0)
MCHC: 32.4 g/dL (ref 30.0–36.0)
MCV: 81 fL (ref 78.0–100.0)
Monocytes Absolute: 1 10*3/uL (ref 0.1–1.0)
Monocytes Relative: 9 % (ref 3–12)
Neutro Abs: 9.6 10*3/uL — ABNORMAL HIGH (ref 1.7–7.7)
Neutrophils Relative %: 79 % — ABNORMAL HIGH (ref 43–77)
Platelets: 242 10*3/uL (ref 150–400)
RBC: 4.2 MIL/uL (ref 3.87–5.11)
RDW: 13.2 % (ref 11.5–15.5)
WBC: 12.2 10*3/uL — ABNORMAL HIGH (ref 4.0–10.5)

## 2015-02-05 LAB — COMPREHENSIVE METABOLIC PANEL
ALT: 23 U/L (ref 14–54)
AST: 23 U/L (ref 15–41)
Albumin: 3.6 g/dL (ref 3.5–5.0)
Alkaline Phosphatase: 95 U/L (ref 38–126)
Anion gap: 9 (ref 5–15)
BUN: 9 mg/dL (ref 6–20)
CO2: 25 mmol/L (ref 22–32)
Calcium: 8.8 mg/dL — ABNORMAL LOW (ref 8.9–10.3)
Chloride: 104 mmol/L (ref 101–111)
Creatinine, Ser: 0.62 mg/dL (ref 0.44–1.00)
GFR calc Af Amer: >60 mL/min (ref >60)
GFR calc non Af Amer: >60 mL/min (ref >60)
Glucose, Bld: 115 mg/dL — ABNORMAL HIGH (ref 70–99)
Potassium: 3.7 mmol/L (ref 3.5–5.1)
Sodium: 138 mmol/L (ref 135–145)
Total Bilirubin: 0.6 mg/dL (ref 0.3–1.2)
Total Protein: 6.3 g/dL — ABNORMAL LOW (ref 6.5–8.1)

## 2015-02-05 LAB — I-STAT CG4 LACTIC ACID, ED
Lactic Acid, Venous: 0.69 mmol/L (ref 0.5–2.0)
Lactic Acid, Venous: 0.47 mmol/L — ABNORMAL LOW (ref 0.5–2.0)

## 2015-02-05 MED ORDER — CLINDAMYCIN PHOSPHATE 600 MG/50ML IV SOLN
600.0000 mg | Freq: Once | INTRAVENOUS | Status: AC
  Administered 2015-02-05: 600 mg via INTRAVENOUS
  Filled 2015-02-05: qty 50

## 2015-02-05 MED ORDER — LIDOCAINE-EPINEPHRINE (PF) 2 %-1:200000 IJ SOLN
10.0000 mL | Freq: Once | INTRAMUSCULAR | Status: AC
  Administered 2015-02-05: 10 mL via INTRADERMAL
  Filled 2015-02-05: qty 20

## 2015-02-05 NOTE — ED Provider Notes (Signed)
CSN: [id number]     Arrival date & time 02/05/15  1648 History   First MD Initiated Contact with Patient 02/05/15 2219     Chief Complaint  Patient presents with  . Abscess     (Consider location/radiation/quality/duration/timing/severity/associated sxs/prior Treatment) HPI Meghan Avila is a 64 y.o. female with no major medical problems, presents to emergency department complaining of abscess in the left axilla. Patient states she noticed a small pimple in that area several days ago. Since then has been larger and more painful. She went to urgent care today where she was seen and was sent here for possible surgical incision and drainage. Patient admits to low-grade fever. Generalized malaise. There is some redness around the area. She states she tried to squeeze the abscess several days ago and just got "brown stuff out." She has no other complaints. No history of the same.  Past Medical History  Diagnosis Date  . Arthritis   . GERD (gastroesophageal reflux disease)   . Leg swelling   . Hand swelling     related to lymph nodes    Past Surgical History  Procedure Laterality Date  . Hemorroidectomy  2002, 2005  . Scoliosis  1970, 1972  . Cholecystectomy  1992  . Breast reduction surgery  1997  . Laser ablation  2008    left leg   . Deep axillary sentinel node biopsy / excision      x2   Family History  Problem Relation Age of Onset  . Varicose Veins Mother   . Cancer Sister   . Heart disease Sister     before age 54  . Hypertension Sister   . Varicose Veins Sister   . Heart attack Sister   . Hyperlipidemia Brother    History  Substance Use Topics  . Smoking status: Former Research scientist (life sciences)  . Smokeless tobacco: Never Used  . Alcohol Use: No   OB History    No data available     Review of Systems  Constitutional: Positive for fever and chills.  Respiratory: Negative for cough, chest tightness and shortness of breath.   Cardiovascular: Negative for chest pain, palpitations  and leg swelling.  Musculoskeletal: Negative for myalgias, arthralgias, neck pain and neck stiffness.  Skin: Positive for wound. Negative for rash.  Neurological: Negative for dizziness, weakness and headaches.  All other systems reviewed and are negative.     Allergies  Zantac; Hydrocodone; Morphine and related; Prednisone; and Tramadol  Home Medications   Prior to Admission medications   Medication Sig Start Date End Date Taking? Authorizing Provider  acetaminophen (TYLENOL) 500 MG tablet Take 1,000 mg by mouth every 6 (six) hours as needed for mild pain, moderate pain or fever.   Yes Historical Provider, MD  calcium carbonate (TUMS EX) 750 MG chewable tablet Chew 2 tablets by mouth daily as needed for heartburn.   Yes Historical Provider, MD  furosemide (LASIX) 20 MG tablet Take 20 mg by mouth daily. 01/25/15  Yes Historical Provider, MD  ibuprofen (ADVIL,MOTRIN) 200 MG tablet Take 200 mg by mouth every 6 (six) hours as needed for mild pain or moderate pain.   Yes Historical Provider, MD  Misc Natural Products (GLUCOSAMINE CHONDROITIN ADV) TABS Take 1 tablet by mouth daily.   Yes Historical Provider, MD  Vitamin D, Ergocalciferol, (DRISDOL) 50000 UNITS CAPS capsule Take 50,000 Units by mouth every 7 (seven) days.  07/27/14  Yes Historical Provider, MD   BP 122/64 mmHg  Pulse 104  Temp(Src)  99.1 F (37.3 C) (Oral)  Resp 20  SpO2 96% Physical Exam  Constitutional: She appears well-developed and well-nourished. No distress.  HENT:  Head: Normocephalic.  Eyes: Conjunctivae are normal.  Neck: Neck supple.  Cardiovascular: Normal rate, regular rhythm and normal heart sounds.   Pulmonary/Chest: Effort normal and breath sounds normal. No respiratory distress. She has no wheezes. She has no rales.  Musculoskeletal: She exhibits no edema.  Neurological: She is alert.  Skin: Skin is warm and dry.  Small pustule to the left axilla, skin induration surrounding the pustule measuring  approximately 4 cm in diameter. Tender to palpation. Fluctuant. Mild surrounding cellulitis over the proximal breast.  Psychiatric: She has a normal mood and affect. Her behavior is normal.  Nursing note and vitals reviewed.   ED Course  Procedures (including critical care time) Labs Review Labs Reviewed  COMPREHENSIVE METABOLIC PANEL - Abnormal; Notable for the following:    Glucose, Bld 115 (*)    Calcium 8.8 (*)    Total Protein 6.3 (*)    All other components within normal limits  CBC WITH DIFFERENTIAL/PLATELET - Abnormal; Notable for the following:    WBC 12.2 (*)    Hemoglobin 11.0 (*)    HCT 34.0 (*)    Neutrophils Relative % 79 (*)    Neutro Abs 9.6 (*)    All other components within normal limits  I-STAT CG4 LACTIC ACID, ED - Abnormal; Notable for the following:    Lactic Acid, Venous 0.47 (*)    All other components within normal limits  I-STAT CG4 LACTIC ACID, ED    Imaging Review No results found.   EKG Interpretation None     INCISION AND DRAINAGE Performed by: Jeannett Senior A Consent: Verbal consent obtained. Risks and benefits: risks, benefits and alternatives were discussed Type: abscess  Body area: left axilla  Anesthesia: local infiltration  Incision was made with a scalpel.  Local anesthetic: lidocaine 2% w epinephrine  Anesthetic total: 3 ml  Complexity: complex Blunt dissection to break up loculations  Drainage: purulent  Drainage amount: moderate  Packing material: 1/4 in iodoform gauze  Patient tolerance: Patient tolerated the procedure well with no immediate complications.    MDM   Final diagnoses:  Abscess of left axilla  Cellulitis of left axilla    Patient with an abscess to the left axilla, surrounding cellulitis, low-grade fever. Lab work obtained, mildly elevated white count at 12.2. Lactic acid is normal. Wound incised and drained in emergency department with moderate drainage. Packed. Patient is instructed to  return in 2 days for recheck. Clindamycin 600 mg given in emergency department.  Filed Vitals:   02/05/15 1753 02/05/15 1930 02/05/15 2243  BP: 123/79 120/73 122/64  Pulse: 103 100 104  Temp: 100 F (37.8 C) 100.5 F (38.1 C) 99.1 F (37.3 C)  TempSrc: Oral Oral Oral  Resp: 20 20 20   SpO2: 95% 99% 96%       Jeannett Senior, PA-C 02/06/15 0018  Alfonzo Beers, MD 02/06/15 [phone]

## 2015-02-05 NOTE — ED Notes (Signed)
Pt is here with fever and left axilla abscess and physician sent here feeling it may need surgery and that the abscess is deep.

## 2015-02-05 NOTE — ED Notes (Signed)
MD at bedside. 

## 2015-02-05 NOTE — ED Notes (Signed)
Suture cart and lido at bedside. PT ambulated to restroom without distress.

## 2015-02-06 ENCOUNTER — Encounter (HOSPITAL_BASED_OUTPATIENT_CLINIC_OR_DEPARTMENT_OTHER): Payer: Self-pay | Admitting: Emergency Medicine

## 2015-02-06 ENCOUNTER — Telehealth (HOSPITAL_BASED_OUTPATIENT_CLINIC_OR_DEPARTMENT_OTHER): Payer: Self-pay | Admitting: Emergency Medicine

## 2015-02-06 MED ORDER — CLINDAMYCIN HCL 150 MG PO CAPS: 300.0000 mg | ORAL_CAPSULE | Freq: Three times a day (TID) | ORAL | Status: DC

## 2015-02-06 NOTE — Discharge Instructions (Signed)
Take clindamycin as prescribed until all gone. Keep a close eye on redness surrounding the abscess. If appears to be improving,  remove the packing in 2 days. Continue warm compresses. If the redness spreads outside the marked area rapidly return to emergency department. If it is not improving in 2 days, return to emergency department for reevaluation and packing removal.   Abscess Care After An abscess (also called a boil or furuncle) is an infected area that contains a collection of pus. Signs and symptoms of an abscess include pain, tenderness, redness, or hardness, or you may feel a moveable soft area under your skin. An abscess can occur anywhere in the body. The infection may spread to surrounding tissues causing cellulitis. A cut (incision) by the surgeon was made over your abscess and the pus was drained out. Gauze may have been packed into the space to provide a drain that will allow the cavity to heal from the inside outwards. The boil may be painful for 5 to 7 days. Most people with a boil do not have high fevers. Your abscess, if seen early, may not have localized, and may not have been lanced. If not, another appointment may be required for this if it does not get better on its own or with medications. HOME CARE INSTRUCTIONS   Only take over-the-counter or prescription medicines for pain, discomfort, or fever as directed by your caregiver.  When you bathe, soak and then remove gauze or iodoform packs at least daily or as directed by your caregiver. You may then wash the wound gently with mild soapy water. Repack with gauze or do as your caregiver directs. SEEK IMMEDIATE MEDICAL CARE IF:   You develop increased pain, swelling, redness, drainage, or bleeding in the wound site.  You develop signs of generalized infection including muscle aches, chills, fever, or a general ill feeling.  An oral temperature above 102 F (38.9 C) develops, not controlled by medication. See your caregiver  for a recheck if you develop any of the symptoms described above. If medications (antibiotics) were prescribed, take them as directed. Document Released: 04/10/2005 Document Revised: 12/15/2011 Document Reviewed: 12/06/2007 [facility] Patient Information 2015 [place], [place]. This information is not intended to replace advice given to you by your health care provider. Make sure you discuss any questions you have with your health care provider.

## 2015-03-13 ENCOUNTER — Encounter: Payer: Self-pay | Admitting: Internal Medicine

## 2015-03-13 ENCOUNTER — Ambulatory Visit (INDEPENDENT_AMBULATORY_CARE_PROVIDER_SITE_OTHER)
Admission: RE | Admit: 2015-03-13 | Discharge: 2015-03-13 | Disposition: A | Discharge status: Home / Self Care | Payer: 59 | Source: Ambulatory Visit | Attending: Internal Medicine | Admitting: Internal Medicine

## 2015-03-13 ENCOUNTER — Other Ambulatory Visit (INDEPENDENT_AMBULATORY_CARE_PROVIDER_SITE_OTHER): Payer: 59

## 2015-03-13 ENCOUNTER — Ambulatory Visit (INDEPENDENT_AMBULATORY_CARE_PROVIDER_SITE_OTHER): Payer: 59 | Admitting: Internal Medicine

## 2015-03-13 VITALS — BP 120/60 | HR 63 | Ht 66.5 in | Wt 152.0 lb

## 2015-03-13 DIAGNOSIS — R0600 Dyspnea, unspecified: Secondary | ICD-10-CM

## 2015-03-13 DIAGNOSIS — R05 Cough: Secondary | ICD-10-CM

## 2015-03-13 DIAGNOSIS — R058 Other specified cough: Secondary | ICD-10-CM

## 2015-03-13 LAB — CBC WITH DIFFERENTIAL/PLATELET
Basophils Absolute: 0 10*3/uL (ref 0.0–0.1)
Basophils Relative: 0.6 % (ref 0.0–3.0)
Eosinophils Absolute: 0.2 10*3/uL (ref 0.0–0.7)
Eosinophils Relative: 3 % (ref 0.0–5.0)
HCT: 34.7 % — ABNORMAL LOW (ref 36.0–46.0)
Hemoglobin: 11.3 g/dL — ABNORMAL LOW (ref 12.0–15.0)
Lymphocytes Relative: 28.3 % (ref 12.0–46.0)
Lymphs Abs: 2 10*3/uL (ref 0.7–4.0)
MCHC: 32.6 g/dL (ref 30.0–36.0)
MCV: 78 fl (ref 78.0–100.0)
Monocytes Absolute: 0.6 10*3/uL (ref 0.1–1.0)
Monocytes Relative: 8.3 % (ref 3.0–12.0)
Neutro Abs: 4.3 10*3/uL (ref 1.4–7.7)
Neutrophils Relative %: 59.8 % (ref 43.0–77.0)
Platelets: 266 10*3/uL (ref 150.0–400.0)
RBC: 4.45 Mil/uL (ref 3.87–5.11)
RDW: 14.1 % (ref 11.5–15.5)
WBC: 7.2 10*3/uL (ref 4.0–10.5)

## 2015-03-13 LAB — BRAIN NATRIURETIC PEPTIDE: Pro B Natriuretic peptide (BNP): 17 pg/mL (ref 0.0–100.0)

## 2015-03-13 LAB — HEPATIC FUNCTION PANEL
ALT: 22 U/L (ref 0–35)
AST: 18 U/L (ref 0–37)
Albumin: 4.1 g/dL (ref 3.5–5.2)
Alkaline Phosphatase: 114 U/L (ref 39–117)
Bilirubin, Direct: 0 mg/dL (ref 0.0–0.3)
Total Bilirubin: 0.2 mg/dL (ref 0.2–1.2)
Total Protein: 6.6 g/dL (ref 6.0–8.3)

## 2015-03-13 LAB — BASIC METABOLIC PANEL
BUN: 18 mg/dL (ref 6–23)
CO2: 30 mEq/L (ref 19–32)
Calcium: 9 mg/dL (ref 8.4–10.5)
Chloride: 103 mEq/L (ref 96–112)
Creatinine, Ser: 0.68 mg/dL (ref 0.40–1.20)
GFR: 92.46 mL/min (ref >60.00)
Glucose, Bld: 115 mg/dL — ABNORMAL HIGH (ref 70–99)
Potassium: 3.7 mEq/L (ref 3.5–5.1)
Sodium: 138 mEq/L (ref 135–145)

## 2015-03-13 LAB — TSH: TSH: 1.17 u[IU]/mL (ref 0.35–4.50)

## 2015-03-13 NOTE — Patient Instructions (Signed)
Try prilosec 20mg   Take 30-60 min before first meal of the day and Pepcid (famotidine) 20 mg one bedtime until cough is completely gone for at least a week without the need for cough suppression     GERD (REFLUX)  is an extremely common cause of respiratory symptoms just like yours , many times with no obvious heartburn at all.    It can be treated with medication, but also with lifestyle changes including avoidance of late meals, elevation of the head of your bed (ideally with 6 inch  bed blocks) excessive alcohol, smoking cessation, and avoid fatty foods, chocolate, peppermint, colas, red wine, and acidic juices such as orange juice.  NO MINT OR MENTHOL PRODUCTS SO NO COUGH DROPS  USE SUGARLESS CANDY INSTEAD (Jolley ranchers or Stover's or Life Savers) or even ice chips will also do - the key is to swallow to prevent all throat clearing. NO OIL BASED VITAMINS - use powdered substitutes.    Please remember to go to the lab and x-ray department downstairs for your tests - we will call you with the results when they are available.    Please schedule a follow up office visit in 2 weeks, sooner if needed

## 2015-03-13 NOTE — Progress Notes (Signed)
Subjective:    Patient ID: Meghan Avila, female    DOB: 09/22/1951,     MRN: [id number]  HPI  28 ywof quit smoking 1980 at first pregnancy with new onset sob mid April of 2016 then started coughing and referred for pulmonary eval 03/13/2015 by Dr Kathyrn Lass   03/13/2015 1st [facility] office visit/ Nicoli Nardozzi   Chief Complaint  Patient presents with  . Pulmonary Consult    Referred by Dr. Kathyrn Lass. Pt c/o SOB for the past 6 wks "kind of all of the time".  She also states doing alot of coughing- non prod.   pt sp L knee arthroscopy feb 2 2016[place] [facility] by Dr Theda Sers and then opened it up March 15 office the mid April onset of sob when bending over mainly and some doe and cough but really can be sob at rest as well s pattern x not noct unless coughing.  Cough is 24/7 and does wake her up / has midline cp immediately p eating better with tums Chronic nasal sniffles x years / does have mold exp at home/ neg venous dopplers 12/06/14   No obvious day to day or daytime variabilty or assoc  cp or chest tightness, subjective wheeze overt sinus   symptoms. No unusual exp hx or h/o childhood pna/ asthma or knowledge of premature birth.  Sleeping ok without nocturnal  or early am exacerbation  of respiratory  c/o's or need for noct saba. Also denies any obvious fluctuation of symptoms with weather or environmental changes or other aggravating or alleviating factors except as outlined above   Current Medications, Allergies, Complete Past Medical History, Past Surgical History, Family History, and Social History were reviewed in [employer] record.           Review of Systems  Constitutional: Negative for fever, chills and unexpected weight change.  HENT: Negative for congestion, dental problem, ear pain, nosebleeds, postnasal drip, rhinorrhea, sinus pressure, sneezing, sore throat, trouble swallowing and voice change.   Eyes: Negative for visual disturbance.  Respiratory:  Positive for cough and shortness of breath. Negative for choking.   Cardiovascular: Positive for leg swelling. Negative for chest pain.  Gastrointestinal: Positive for abdominal pain. Negative for vomiting and diarrhea.  Genitourinary: Negative for difficulty urinating.       Acid heartburn  Musculoskeletal: Negative for arthralgias.  Skin: Negative for rash.  Neurological: Positive for headaches. Negative for tremors and syncope.  Hematological: Does not bruise/bleed easily.       Objective:   Physical Exam  Wt Readings from Last 3 Encounters:  03/13/15 157 lb (71.215 kg)  08/01/14 152 lb (68.947 kg)  07/21/14 154 lb 9.6 oz (70.126 kg)    Vital signs reviewed   amb wf nad with mild pseudowheeze  HEENT: nl dentition, turbinates, and orophanx. Nl external ear canals without cough reflex   NECK :  without JVD/Nodes/TM/ nl carotid upstrokes bilaterally   LUNGS: no acc muscle use, clear to A and P bilaterally without cough on insp or exp maneuvers   CV:  RRR  no s3 or murmur or increase in P2, no edema   ABD:  soft and nontender with nl excursion in the supine position. No bruits or organomegaly, bowel sounds nl  MS:  warm without deformities, calf tenderness, cyanosis or clubbing  SKIN: warm and dry without lesions    NEURO:  alert, approp, no deficits    CXR PA and Lateral:   03/13/2015 :  I personally reviewed images and agree with radiology impression as follows:     There is no focal parenchymal opacity. There is no pleural effusion or pneumothorax. The heart and mediastinal contours are unremarkable.  There is a large hiatal hernia.(this is extremely large)  There surgical clips projecting over the right lower chest wall likely from prior axillary dissection.  The osseous structures are unremarkable but there is sign kyphosis    Labs ordered/ reviewed:   Lab 03/13/15 1612  NA 138  K 3.7  CL 103  CO2 30  BUN 18  CREATININE 0.68  GLUCOSE 115*     Alb 4.1   Lab 03/13/15 1612  HGB 11.3*  HCT 34.7*  WBC 7.2  PLT 266.0     Lab Results  Component Value Date   TSH 1.17 03/13/2015     Lab Results  Component Value Date   PROBNP 17.0 03/13/2015    D dimer pending from 03/13/15     Assessment & Plan:

## 2015-03-14 ENCOUNTER — Encounter: Payer: Self-pay | Admitting: Internal Medicine

## 2015-03-14 ENCOUNTER — Telehealth: Payer: Self-pay | Admitting: Internal Medicine

## 2015-03-14 DIAGNOSIS — R05 Cough: Secondary | ICD-10-CM | POA: Insufficient documentation

## 2015-03-14 DIAGNOSIS — R058 Other specified cough: Secondary | ICD-10-CM | POA: Insufficient documentation

## 2015-03-14 LAB — D-DIMER, QUANTITATIVE: D-Dimer, Quant: 0.64 ug/mL-FEU — ABNORMAL HIGH (ref 0.00–0.48)

## 2015-03-14 NOTE — Assessment & Plan Note (Signed)
The most common causes of chronic cough in immunocompetent adults include the following: upper airway cough syndrome (UACS), previously referred to as postnasal drip syndrome (PNDS), which is caused by variety of rhinosinus conditions; (2) asthma; (3) GERD; (4) chronic bronchitis from cigarette smoking or other inhaled environmental irritants; (5) nonasthmatic eosinophilic bronchitis; and (6) bronchiectasis.   These conditions, singly or in combination, have accounted for up to 94% of the causes of chronic cough in prospective studies.   Other conditions have constituted no >6% of the causes in prospective studies These have included bronchogenic carcinoma, chronic interstitial pneumonia, sarcoidosis, left ventricular failure, ACEI-induced cough, and aspiration from a condition associated with pharyngeal dysfunction.    Chronic cough is often simultaneously caused by more than one condition. A single cause has been found from 38 to 82% of the time, multiple causes from 18 to 62%. Multiply caused cough has been the result of three diseases up to 42% of the time.       Based on hx and exam, this is most likely:  Classic Upper airway cough syndrome, so named because it's frequently impossible to sort out how much is  CR/sinusitis with freq throat clearing (which can be related to primary GERD)   vs  causing  secondary (" extra esophageal")  GERD from wide swings in gastric pressure that occur with throat clearing, often  promoting self use of mint and menthol lozenges that reduce the lower esophageal sphincter tone and exacerbate the problem further in a cyclical fashion.   These are the same pts (now being labeled as having "irritable larynx syndrome" by some cough centers) who not infrequently have a history of having failed to tolerate ace inhibitors,  dry powder inhalers or biphosphonates or report having atypical reflux symptoms that don't respond to standard doses of PPI , and are easily confused as  having aecopd or asthma flares by even experienced allergists/ pulmonologists.   The first step is to maximize acid suppression and  then regroup in two weeks perhaps with allergy profile   See instructions for specific recommendations which were reviewed directly with the patient who was given a copy with highlighter outlining the key components.

## 2015-03-14 NOTE — Assessment & Plan Note (Addendum)
-   onset mid April 2016  - 03/13/2015  Walked RA x 3 laps @ 185 ft each stopped due to  End of study, nl pace, no desats or tachycardia  Symptoms are markedly disproportionate to objective findings and not clear this is a lung problem but pt does appear to have difficult airway management issues. DDX of  difficult airways management all start with A and  include Adherence, Ace Inhibitors, Acid Reflux, Active Sinus Disease, Alpha 1 Antitripsin deficiency, Anxiety masquerading as Airways dz,  ABPA,  allergy(esp in young), Aspiration (esp in elderly), Adverse effects of DPI,  Active smokers, A bunch of PE's (one or two won't cause sob syndrome like this) plus two Bs  = Bronchiectasis and Beta blocker use..and one C= CHF  ? Acid (or non-acid) GERD > always difficult to exclude as up to 75% of pts in some series report no assoc GI/ Heartburn symptoms and she has a huge HH > rec max (24h)  acid suppression and diet restrictions/ reviewed and instructions given in writing.   ? Allergy/ note mold exposure chronically but doubt sign here.   No evidence chf/ pe unlikely since had ven dopplers neg though not clear this was  since onset of sob and can be excluded with d dimer in this setting.  She has a huge hernia taking up space in chest (which would explain worse symptoms bending over)  and T kyphosis but these existed for a long time before her sob and would not explain the new sob.  See uacs listed separately

## 2015-03-14 NOTE — Telephone Encounter (Signed)
Notes Recorded by Rosana Berger, CMA on 03/14/2015 at 8:57 AM LMTCB Notes Recorded by Rosana Berger, [place] on 03/14/2015 at 8:56 AM LMTCB Notes Recorded by Tanda Rockers, MD on 03/14/2015 at 7:46 AM Call patient : Study is unremarkable, no change in recs (upper limits nl higher at age 64 than standards) Notes Recorded by Tanda Rockers, MD on 03/13/2015 at 5:15 PM Call patient : Studies are unremarkable, no change in recs --------------------------- Notes Recorded by Rosana Berger, [place] on 03/14/2015 at 8:57 AM LMTCB ------  Notes Recorded by Tanda Rockers, MD on 03/13/2015 at 5:15 PM Call pt: Reviewed cxr and no acute change so no change in recommendations made at ov ------------------------- Pt is aware of labs and CXR results.

## 2015-03-14 NOTE — Progress Notes (Signed)
Quick Note:  LMTCB ______ 

## 2015-03-23 ENCOUNTER — Other Ambulatory Visit: Payer: Self-pay | Admitting: Gastroenterology

## 2015-03-29 ENCOUNTER — Encounter: Payer: Self-pay | Admitting: Internal Medicine

## 2015-03-29 ENCOUNTER — Ambulatory Visit (INDEPENDENT_AMBULATORY_CARE_PROVIDER_SITE_OTHER): Payer: 59 | Admitting: Internal Medicine

## 2015-03-29 VITALS — BP 120/70 | HR 75 | Ht 59.5 in | Wt 145.0 lb

## 2015-03-29 DIAGNOSIS — R05 Cough: Secondary | ICD-10-CM

## 2015-03-29 DIAGNOSIS — R0600 Dyspnea, unspecified: Secondary | ICD-10-CM

## 2015-03-29 DIAGNOSIS — R058 Other specified cough: Secondary | ICD-10-CM

## 2015-03-29 MED ORDER — PANTOPRAZOLE SODIUM 40 MG PO TBEC: 40.0000 mg | DELAYED_RELEASE_TABLET | Freq: Every day | ORAL | Status: DC

## 2015-03-29 NOTE — Patient Instructions (Addendum)
Pantoprazole (protonix) 40 mg   Take  30-60 min before first meal of the day and Pepcid (famotidine)  20 mg one @  Bedtime   GERD (REFLUX)  is an extremely common cause of respiratory symptoms just like yours , many times with no obvious heartburn at all.    It can be treated with medication, but also with lifestyle changes including elevation of the head of your bed (ideally with 6 inch  bed blocks),  Smoking cessation, avoidance of late meals, excessive alcohol, and avoid fatty foods, chocolate, peppermint, colas, red wine, and acidic juices such as orange juice.  NO MINT OR MENTHOL PRODUCTS SO NO COUGH DROPS  USE SUGARLESS CANDY INSTEAD (Jolley ranchers or Stover's or Life Savers) or even ice chips will also do - the key is to swallow to prevent all throat clearing. NO OIL BASED VITAMINS - use powdered substitutes.  You do not appear to have a heart or lung problem but a very large Hiatal hernia and I will let Dr Dorthula Rue know and see if can see your sooner

## 2015-03-29 NOTE — Progress Notes (Addendum)
Subjective:    Patient ID: Meghan Avila, female    DOB: [birth year]      MRN: [id number]    Brief patient profile:  53 ywof quit smoking 1980 at first pregnancy with new onset sob mid April of 2016 then started coughing and referred for pulmonary eval 03/13/2015 by Dr Kathyrn Lass   History of Present Illness  03/13/2015 1st [facility] office visit/ Meghan Avila   Chief Complaint  Patient presents with  . Pulmonary Consult    Referred by Dr. Kathyrn Lass. Pt c/o SOB for the past 6 wks "kind of all of the time".  She also states doing alot of coughing- non prod.   pt sp L knee arthroscopy feb 2 2016[place] [facility] by Dr Theda Sers and then opened it up March 15 office the mid April onset of sob when bending over mainly and some doe and cough but really can be sob at rest as well s pattern x not noct unless coughing sleeps at 30 degrees  Cough is 24/7 and does wake her up / has midline cp immediately p eating better with tums Chronic nasal sniffles x years / does have mold exp at home/ neg venous dopplers 12/06/14  rec Try prilosec 20mg   Take 30-60 min before first meal of the day and Pepcid (famotidine) 20 mg one bedtime until cough is completely gone for at least a week without the need for cough suppression GERD  Diet   03/29/2015 f/u ov/Meghan Avila re: atypical cp/ sob not reproduced with exertion  Chief Complaint  Patient presents with  . Follow-up    Cough and SOB are both unchanged. No new co's today. Still having midline CP after eating and has to vomit after every meal.   symtoms are now entirely related to meals    - had appt with DR Mellody Memos but "too sick to keep it" and rescheduled for Jul 09, 2015   No obvious other patterns in day to day or daytime variabilty or assoc excess/ purulent sputum or  chest tightness, subjective wheeze overt sinus or hb symptoms. No unusual exp hx or h/o childhood pna/ asthma or knowledge of premature birth.  Sleeping ok without nocturnal  or early am exacerbation  of  respiratory  c/o's or need for noct saba. Also denies any obvious fluctuation of symptoms with weather or environmental changes or other aggravating or alleviating factors except as outlined above   Current Medications, Allergies, Complete Past Medical History, Past Surgical History, Family History, and Social History were reviewed in [employer] record.  ROS  The following are not active complaints unless bolded sore throat, dysphagia, dental problems, itching, sneezing,  nasal congestion or excess/ purulent secretions, ear ache,   fever, chills, sweats, unintended wt loss, pleuritic or exertional cp, hemoptysis,  orthopnea pnd or leg swelling, presyncope, palpitations, abdominal pain, anorexia, nausea, vomiting, diarrhea  or change in bowel or urinary habits, change in stools or urine, dysuria,hematuria,  rash, arthralgias, visual complaints, headache, numbness weakness or ataxia or problems with walking or coordination,  change in mood/affect or memory.                  .       Objective:   Physical Exam   03/29/2015        155  Wt Readings from Last 3 Encounters:  03/13/15 157 lb (71.215 kg)  08/01/14 152 lb (68.947 kg)  07/21/14 154 lb 9.6 oz (70.126 kg)  Vital signs reviewed   amb wf nad with mild pseudowheeze  HEENT: nl dentition, turbinates, and orophanx. Nl external ear canals without cough reflex   NECK :  without JVD/Nodes/TM/ nl carotid upstrokes bilaterally   LUNGS: no acc muscle use, clear to A and P bilaterally without cough on insp or exp maneuvers   CV:  RRR  no s3 or murmur or increase in P2, no edema   ABD:  soft and nontender with nl excursion in the supine position. No bruits or organomegaly, bowel sounds nl  MS:  warm without deformities, calf tenderness, cyanosis or clubbing  SKIN: warm and dry without lesions    NEURO:  alert, approp, no deficits    CXR PA and Lateral:   03/13/2015 :     I personally reviewed images and  agree with radiology impression as follows:     There is no focal parenchymal opacity. There is no pleural effusion or pneumothorax. The heart and mediastinal contours are unremarkable.  There is a large hiatal hernia.(this is extremely large)  There surgical clips projecting over the right lower chest wall likely from prior axillary dissection.  The osseous structures are unremarkable but there is sign kyphosis    Labs ordered/ reviewed:   Lab 03/13/15 1612  NA 138  K 3.7  CL 103  CO2 30  BUN 18  CREATININE 0.68  GLUCOSE 115*   Alb 4.1   Lab 03/13/15 1612  HGB 11.3*  HCT 34.7*  WBC 7.2  PLT 266.0     Lab Results  Component Value Date   TSH 1.17 03/13/2015     Lab Results  Component Value Date   PROBNP 17.0 03/13/2015    D dimer   03/13/15  0.64     Assessment & Plan:   Outpatient Encounter Prescriptions as of 03/29/2015  Medication Sig  . acetaminophen (TYLENOL) 500 MG tablet Take 1,000 mg by mouth every 6 (six) hours as needed for mild pain, moderate pain or fever.  Marland Kitchen aspirin-acetaminophen-caffeine (EXCEDRIN MIGRAINE) 250-250-65 MG per tablet Take 1 tablet by mouth every 6 (six) hours as needed for headache.  . calcium carbonate (TUMS EX) 750 MG chewable tablet Chew 2 tablets by mouth daily as needed for heartburn.  . famotidine (PEPCID) 20 MG tablet Take 20 mg by mouth at bedtime.  . furosemide (LASIX) 20 MG tablet Take 20 mg by mouth daily.  Marland Kitchen ibuprofen (ADVIL,MOTRIN) 200 MG tablet Take 200 mg by mouth every 6 (six) hours as needed for mild pain or moderate pain.  . Misc Natural Products (GLUCOSAMINE CHONDROITIN ADV) TABS Take 1 tablet by mouth daily.  . Multiple Vitamin (MULTIVITAMIN WITH MINERALS) TABS tablet Take 1 tablet by mouth daily.  Marland Kitchen omeprazole (PRILOSEC) 20 MG capsule Take 20 mg by mouth daily before breakfast.  . Vitamin D, Ergocalciferol, (DRISDOL) 50000 UNITS CAPS capsule Take 50,000 Units by mouth every 7 (seven) days. Saturday  .  pantoprazole (PROTONIX) 40 MG tablet Take 1 tablet (40 mg total) by mouth daily.   No facility-administered encounter medications on file as of 03/29/2015.

## 2015-03-30 ENCOUNTER — Telehealth: Payer: Self-pay | Admitting: Internal Medicine

## 2015-03-30 NOTE — Telephone Encounter (Signed)
Patient says that she received a call regarding xray results.   She just wanted verification that the report said that she has a Large Hiatal Hernia. She wanted to be able to take that information to her GI doctor. Patient requested copies of labs and xray. Confirmed mailing address with patient, put copy in mail for patient. Nothing further needed.

## 2015-04-01 ENCOUNTER — Encounter: Payer: Self-pay | Admitting: Internal Medicine

## 2015-04-01 NOTE — Assessment & Plan Note (Addendum)
-   onset mid April 2016  - 03/13/2015  Walked RA x 3 laps @ 185 ft each stopped due to  End of study, nl pace, no desats or tachycardia - [id number]  Walked RA x 3 laps @ 185 ft each stopped due to  End of study , very brisk pace, no desat or sob   Her d dimer is borderline elevated; however,  While a borderline elevated or even nl d dimer  may miss small peripheral pe, the clot burden with sob is moderately high and the d dimer even in the upper range of nl (as is here corrected for age) has a very high neg pred value in this setting    I had an extended final summary discussion with the patient reviewing all relevant studies completed to date and  lasting 15 to 20 minutes of a 25 minute visit on the following issues:    1) this is clearly not a lung problem but related to huge HH which occupies a very large space in her chest with clear reflux issues > referred to GI for early eval   2) in meantime max rx for gerd > Each maintenance medication was reviewed in detail including most importantly the difference between maintenance and as needed and under what circumstances the prns are to be used.  Please see instructions for details which were reviewed in writing and the patient given a copy.

## 2015-04-01 NOTE — Assessment & Plan Note (Signed)
Until/ unless huge HH and gerd are addressed there is nothing else to offer in the pulmonary clinic but would be happy to see again prn

## 2015-04-02 ENCOUNTER — Encounter (HOSPITAL_COMMUNITY): Admission: RE | Payer: Self-pay | Source: Ambulatory Visit

## 2015-04-02 ENCOUNTER — Ambulatory Visit (HOSPITAL_COMMUNITY): Admission: RE | Admit: 2015-04-02 | Payer: 59 | Source: Ambulatory Visit | Admitting: Gastroenterology

## 2015-04-02 ENCOUNTER — Telehealth: Payer: Self-pay | Admitting: *Deleted

## 2015-04-02 ENCOUNTER — Telehealth: Payer: Self-pay | Admitting: Internal Medicine

## 2015-04-02 DIAGNOSIS — R058 Other specified cough: Secondary | ICD-10-CM

## 2015-04-02 DIAGNOSIS — R05 Cough: Secondary | ICD-10-CM

## 2015-04-02 SURGERY — COLONOSCOPY WITH PROPOFOL
Anesthesia: Monitor Anesthesia Care

## 2015-04-02 NOTE — Telephone Encounter (Signed)
-----   Message from Tanda Rockers, MD sent at 04/01/2015  8:36 AM EDT ----- She needs expedited w/u for a huge symptomatic HH > call Dr Durenda Age office to arrange asap and if they put you off get me on the phone with Earle Gell

## 2015-04-02 NOTE — Telephone Encounter (Signed)
Spoke with Anderson Malta  Appt to see Dr Wynetta Emery on 04/10/15 at 2:30 pm This is the soonest they could see her  Spoke with the pt and notified and she states unable to keep that appt  She has to work  Per MW- we can offer to refer her [facility]  Pt okay with this, and STAT referral was made

## 2015-04-02 NOTE — Telephone Encounter (Signed)
Called and left detailed message on Jennifer's voicemail advising her to cancel everything per Magda Paganini. Nothing further needed.

## 2015-04-02 NOTE — Telephone Encounter (Signed)
Anderson Malta from Dr. Durenda Age office called to speak to [facility].  She spoke with Magda Paganini earlier to cancel appointment with Dr. Wynetta Emery, she wants to know if they need to reschedule the appointment with the patient?  She said that they have procedures scheduled with this patient as well and wants to know if she needs to cancel everything.    She can be reached at [place] please advise.  Thanks.

## 2015-04-02 NOTE — Telephone Encounter (Signed)
Called Dr Durenda Age office again and had to leave msg for [place].

## 2015-04-02 NOTE — Telephone Encounter (Signed)
Called Dr Durenda Age office [phone] Had to leave a msg on "quick care nurse" WCB this pm if we have not heard back

## 2015-04-02 NOTE — Telephone Encounter (Signed)
We are referring to [facility] so she can get sooner appt  Thanks

## 2015-04-02 NOTE — Telephone Encounter (Signed)
Meghan Avila with Dr. Durenda Age office called.  Wants to know if needs to reschedule.  She may be reached at [phone].

## 2015-04-03 ENCOUNTER — Telehealth: Payer: Self-pay | Admitting: Internal Medicine

## 2015-04-03 NOTE — Telephone Encounter (Signed)
Patient called [facility] this morning and they told her that they could not schedule appointment for her until she can obtain her former GI records.  Patient would like to speak with [facility] to get information as to what she needs to do to get this appointment.  The referral was put in as STAT and patient needs to get in with them as soon as possible.  Rodena Piety - can you call this patient and discuss this with her?  Thanks.

## 2015-04-04 ENCOUNTER — Other Ambulatory Visit: Payer: Self-pay | Admitting: Gastroenterology

## 2015-04-04 NOTE — Telephone Encounter (Signed)
Denyse Amass from GI called and spoke with me this morning about scheduling this patient. They received Dr. Durenda Age GI records. They are scheduling out in Sept. They recommended that since patient has already been seen by Dr. Wynetta Emery to get an appt with him.  I called Ms. Goto and explained what GI had told me about her getting an appt with Dr. Wynetta Emery. She stated she had an appt 04/02/2015 with him and had to cancel due to work. Then the surgery/Endo was schedule for 07/09/2015 and it was cancelled. She stated she would call Dr. Durenda Age office to try and get another appt.

## 2015-04-06 ENCOUNTER — Other Ambulatory Visit: Payer: Self-pay | Admitting: Gastroenterology

## 2015-04-12 ENCOUNTER — Encounter (HOSPITAL_COMMUNITY): Payer: Self-pay | Admitting: *Deleted

## 2015-04-19 ENCOUNTER — Encounter (HOSPITAL_COMMUNITY)
Admission: RE | Disposition: A | Discharge status: Home / Self Care | Payer: Self-pay | Source: Ambulatory Visit | Attending: Gastroenterology

## 2015-04-19 ENCOUNTER — Ambulatory Visit (HOSPITAL_COMMUNITY)
Admission: RE | Admit: 2015-04-19 | Discharge: 2015-04-19 | Disposition: A | Discharge status: Home / Self Care | Payer: 59 | Source: Ambulatory Visit | Attending: Gastroenterology | Admitting: Gastroenterology

## 2015-04-19 ENCOUNTER — Ambulatory Visit (HOSPITAL_COMMUNITY): Payer: 59 | Admitting: Anesthesiology

## 2015-04-19 ENCOUNTER — Encounter (HOSPITAL_COMMUNITY): Payer: Self-pay | Admitting: *Deleted

## 2015-04-19 DIAGNOSIS — R1013 Epigastric pain: Secondary | ICD-10-CM | POA: Diagnosis present

## 2015-04-19 DIAGNOSIS — Z9049 Acquired absence of other specified parts of digestive tract: Secondary | ICD-10-CM | POA: Diagnosis not present

## 2015-04-19 DIAGNOSIS — K449 Diaphragmatic hernia without obstruction or gangrene: Secondary | ICD-10-CM | POA: Insufficient documentation

## 2015-04-19 DIAGNOSIS — Z87891 Personal history of nicotine dependence: Secondary | ICD-10-CM | POA: Diagnosis not present

## 2015-04-19 DIAGNOSIS — K219 Gastro-esophageal reflux disease without esophagitis: Secondary | ICD-10-CM | POA: Insufficient documentation

## 2015-04-19 DIAGNOSIS — R112 Nausea with vomiting, unspecified: Secondary | ICD-10-CM | POA: Diagnosis present

## 2015-04-19 DIAGNOSIS — R1319 Other dysphagia: Secondary | ICD-10-CM | POA: Diagnosis present

## 2015-04-19 DIAGNOSIS — R1310 Dysphagia, unspecified: Secondary | ICD-10-CM

## 2015-04-19 DIAGNOSIS — Z86010 Personal history of colonic polyps: Secondary | ICD-10-CM | POA: Diagnosis not present

## 2015-04-19 DIAGNOSIS — K5730 Diverticulosis of large intestine without perforation or abscess without bleeding: Secondary | ICD-10-CM | POA: Diagnosis not present

## 2015-04-19 DIAGNOSIS — M1990 Unspecified osteoarthritis, unspecified site: Secondary | ICD-10-CM | POA: Insufficient documentation

## 2015-04-19 HISTORY — DX: Nausea with vomiting, unspecified: R11.2

## 2015-04-19 HISTORY — PX: COLONOSCOPY WITH PROPOFOL: SHX5780

## 2015-04-19 HISTORY — DX: Personal history of other specified conditions: Z87.898

## 2015-04-19 HISTORY — DX: Personal history of other diseases of the digestive system: Z87.19

## 2015-04-19 HISTORY — PX: ESOPHAGOGASTRODUODENOSCOPY (EGD) WITH PROPOFOL: SHX5813

## 2015-04-19 HISTORY — DX: Headache: R51

## 2015-04-19 HISTORY — DX: Headache, unspecified: R51.9

## 2015-04-19 HISTORY — DX: Other specified postprocedural states: Z98.890

## 2015-04-19 SURGERY — ESOPHAGOGASTRODUODENOSCOPY (EGD) WITH PROPOFOL
Anesthesia: Monitor Anesthesia Care

## 2015-04-19 MED ORDER — PROPOFOL 10 MG/ML IV BOLUS
INTRAVENOUS | Status: AC
  Filled 2015-04-19: qty 20

## 2015-04-19 MED ORDER — LIDOCAINE HCL (CARDIAC) 20 MG/ML IV SOLN
INTRAVENOUS | Status: AC
  Filled 2015-04-19: qty 5

## 2015-04-19 MED ORDER — SODIUM CHLORIDE 0.9 % IV SOLN
INTRAVENOUS | Status: DC
Start: 2015-04-19 — End: 2015-04-19

## 2015-04-19 MED ORDER — PROPOFOL INFUSION 10 MG/ML OPTIME
INTRAVENOUS | Status: DC | PRN
  Administered 2015-04-19: 100 ug/kg/min via INTRAVENOUS

## 2015-04-19 MED ORDER — PROPOFOL 500 MG/50ML IV EMUL
INTRAVENOUS | Status: DC | PRN
  Administered 2015-04-19: 20 mg via INTRAVENOUS
  Administered 2015-04-19: 50 mg via INTRAVENOUS

## 2015-04-19 MED ORDER — BUTAMBEN-TETRACAINE-BENZOCAINE 2-2-14 % EX AERO
INHALATION_SPRAY | CUTANEOUS | Status: DC | PRN
  Administered 2015-04-19: 1 via TOPICAL

## 2015-04-19 MED ORDER — ONDANSETRON HCL 4 MG/2ML IJ SOLN
INTRAMUSCULAR | Status: DC | PRN
  Administered 2015-04-19: 4 mg via INTRAVENOUS

## 2015-04-19 MED ORDER — LACTATED RINGERS IV SOLN
INTRAVENOUS | Status: DC
  Administered 2015-04-19: 07:00:00 via INTRAVENOUS
  Administered 2015-04-19: 1000 mL via INTRAVENOUS

## 2015-04-19 SURGICAL SUPPLY — 24 items

## 2015-04-19 NOTE — Anesthesia Postprocedure Evaluation (Signed)
  Anesthesia Post-op Note  Patient: Meghan Avila  Procedure(s) Performed: Procedure(s) (LRB): ESOPHAGOGASTRODUODENOSCOPY (EGD) WITH PROPOFOL (N/A) COLONOSCOPY WITH PROPOFOL (N/A)  Patient Location: PACU  Anesthesia Type: MAC  Level of Consciousness: awake and alert   Airway and Oxygen Therapy: Patient Spontanous Breathing  Post-op Pain: mild  Post-op Assessment: Post-op Vital signs reviewed, Patient's Cardiovascular Status Stable, Respiratory Function Stable, Patent Airway and No signs of Nausea or vomiting  Last Vitals:  Filed Vitals:   04/19/15 0900  BP: 105/63  Pulse: 74  Temp:   Resp: 19    Post-op Vital Signs: stable   Complications: No apparent anesthesia complications

## 2015-04-19 NOTE — Discharge Instructions (Signed)
Colonoscopy, Care After °These instructions give you information on caring for yourself after your procedure. Your doctor may also give you more specific instructions. Call your doctor if you have any problems or questions after your procedure. °HOME CARE °· Do not drive for 24 hours. °· Do not sign important papers or use machinery for 24 hours. °· You may shower. °· You may go back to your usual activities, but go slower for the first 24 hours. °· Take rest breaks often during the first 24 hours. °· Walk around or use warm packs on your belly (abdomen) if you have belly cramping or gas. °· Drink enough fluids to keep your pee (urine) clear or pale yellow. °· Resume your normal diet. Avoid heavy or fried foods. °· Avoid drinking alcohol for 24 hours or as told by your doctor. °· Only take medicines as told by your doctor. °If a tissue sample (biopsy) was taken during the procedure:  °· Do not take aspirin or blood thinners for 7 days, or as told by your doctor. °· Do not drink alcohol for 7 days, or as told by your doctor. °· Eat soft foods for the first 24 hours. °GET HELP IF: °You still have a small amount of blood in your poop (stool) 2-3 days after the procedure. °GET HELP RIGHT AWAY IF: °· You have more than a small amount of blood in your poop. °· You see clumps of tissue (blood clots) in your poop. °· Your belly is puffy (swollen). °· You feel sick to your stomach (nauseous) or throw up (vomit). °· You have a fever. °· You have belly pain that gets worse and medicine does not help. °MAKE SURE YOU: °· Understand these instructions. °· Will watch your condition. °· Will get help right away if you are not doing well or get worse. °Document Released: 10/25/2010 Document Revised: 09/27/2013 Document Reviewed: 05/30/2013 °ExitCare® Patient Information ©2015 [employer]. This information is not intended to replace advice given to you by your health care provider. Make sure you discuss any questions you have with  your health care provider. °Esophagogastroduodenoscopy °Care After °Refer to this sheet in the next few weeks. These instructions provide you with information on caring for yourself after your procedure. Your caregiver may also give you more specific instructions. Your treatment has been planned according to current medical practices, but problems sometimes occur. Call your caregiver if you have any problems or questions after your procedure.  °HOME CARE INSTRUCTIONS °· Do not eat or drink anything until the numbing medicine (local anesthetic) has worn off and your gag reflex has returned. You will know that the local anesthetic has worn off when you can swallow comfortably. °· Do not drive for 12 hours after the procedure or as directed by your caregiver. °· Only take medicines as directed by your caregiver. °SEEK MEDICAL CARE IF:  °· You cannot stop coughing. °· You are not urinating at all or less than usual. °SEEK IMMEDIATE MEDICAL CARE IF: °· You have difficulty swallowing. °· You cannot eat or drink. °· You have worsening throat or chest pain. °· You have dizziness, lightheadedness, or you faint. °· You have nausea or vomiting. °· You have chills. °· You have a fever. °· You have severe abdominal pain. °· You have black, tarry, or bloody stools. °Document Released: 09/08/2012 Document Reviewed: 09/08/2012 °ExitCare® Patient Information ©2015 [employer]. This information is not intended to replace advice given to you by your health care provider. Make sure you   discuss any questions you have with your health care provider. ° °

## 2015-04-19 NOTE — H&P (Signed)
  Problem: Intermittent epigastric pain leading to nausea and vomiting. Screening colonoscopy. Normal screening colonoscopy performed on 10/18/2009. History of colon polyps removed in [place].  History: The patient is a 64 year old female born 1951-01-20. She is scheduled to undergo a repeat surveillance colonoscopy today.  The patient has been experiencing intermittent epigastric pain leading to nausea and vomiting. She has a large hiatal hernia. A diagnostic esophagogastroduodenoscopy is scheduled.  Past medical history: Left knee surgery. Rotator cuff repair surgery. Hemorrhoidectomy. Vaginal hysterectomy. Cholecystectomy. Breast reduction surgery. History of colon polyps. Osteoarthritis. Cystocele. Rectocele.  Medication allergies: Zantac  Exam: The patient is alert and lying comfortably on the endoscopy stretcher. Abdomen is soft and nontender to palpation. Lungs are clear to auscultation. Cardiac exam reveals a regular rhythm.  Plan: Proceed with diagnostic esophagogastroduodenoscopy to evaluate epigastric pain leading to nausea and vomiting followed by surveillance colonoscopy due to a history of colon polyps removed in the past.

## 2015-04-19 NOTE — Op Note (Signed)
Problem: Intermittent postprandial epigastric pain leading to nausea and vomiting. Intermittent esophageal dysphagia. Large hiatal hernia by chest x-ray. History of colon polyps removed in 2004. Normal surveillance colonoscopy performed on 10/18/2009  Endoscopist: Earle Gell  Premedication: Propofol administered by anesthesia  Procedure: Diagnostic esophagogastroduodenoscopy The patient was placed in the left lateral decubitus position. The Pentax gastroscope was passed through the posterior hypopharynx into the proximal esophagus without difficulty. The hypopharynx, larynx, and vocal cords appeared normal.  Esophagoscopy: The proximal, mid, and lower segments of the esophageal mucosa appeared normal. The squamocolumnar junction was regular in appearance and noted at 30 cm from the incisor teeth. There was no endoscopic evidence for the presence of Barrett's esophagus or esophageal stricture formation.  Gastroscopy: There was a moderate sized hiatal hernia. Retroflexed view of the gastric cardia and fundus was normal. The gastric body, antrum, and pylorus appeared normal.  Duodenoscopy: The duodenal bulb and descending duodenum appeared normal.  Assessment: Moderate sized hiatal hernia. Otherwise normal esophagogastroduodenoscopy.  Recommendation: Schedule barium upper GI x-ray series followed by diagnostic high resolution esophageal manometry  Procedure: Surveillance colonoscopy Anal inspection and digital rectal exam were normal. The Pentax pediatric colonoscope was introduced into the rectum and advanced to the cecum. A normal-appearing appendiceal orifice and ileocecal valve were identified. Colonic preparation for the exam today was good. Withdrawal time was 8 minutes  Rectum. Normal. Retroflex view of the distal rectum was normal  Sigmoid colon and descending colon. Left colonic diverticulosis  Splenic flexure. Normal  Transverse colon. Normal  Hepatic flexure.  Normal  Ascending colon. Normal  Cecum and ileocecal valve. Normal  Assessment: Normal surveillance colonoscopy  Recommendation: Schedule repeat colonoscopy in 5 years

## 2015-04-19 NOTE — Transfer of Care (Signed)
Immediate Anesthesia Transfer of Care Note  Patient: Meghan Avila  Procedure(s) Performed: Procedure(s): ESOPHAGOGASTRODUODENOSCOPY (EGD) WITH PROPOFOL (N/A) COLONOSCOPY WITH PROPOFOL (N/A)  Patient Location: PACU  Anesthesia Type:MAC  Level of Consciousness: awake, alert  and oriented  Airway & Oxygen Therapy: Patient Spontanous Breathing and Patient connected to nasal cannula oxygen  Post-op Assessment: Report given to RN and Post -op Vital signs reviewed and stable  Post vital signs: Reviewed and stable  Last Vitals:  Filed Vitals:   04/19/15 0653  BP: 115/73  Temp: 36.6 C  Resp: 21    Complications: No apparent anesthesia complications

## 2015-04-19 NOTE — Anesthesia Preprocedure Evaluation (Addendum)
Anesthesia Evaluation  Patient identified by MRN, date of birth, ID band Patient awake    Reviewed: Allergy & Precautions, NPO status , Patient's Chart, lab work & pertinent test results  Airway Mallampati: II  TM Distance: >3 FB Neck ROM: Full    Dental no notable dental hx.    Pulmonary neg pulmonary ROS, former smoker,  breath sounds clear to auscultation  Pulmonary exam normal       Cardiovascular negative cardio ROS Normal cardiovascular examRhythm:Regular Rate:Normal     Neuro/Psych negative neurological ROS  negative psych ROS   GI/Hepatic Neg liver ROS, GERD-  ,  Endo/Other  negative endocrine ROS  Renal/GU negative Renal ROS  negative genitourinary   Musculoskeletal negative musculoskeletal ROS (+)   Abdominal   Peds negative pediatric ROS (+)  Hematology negative hematology ROS (+)   Anesthesia Other Findings   Reproductive/Obstetrics negative OB ROS                            Anesthesia Physical Anesthesia Plan  ASA: II  Anesthesia Plan: MAC   Post-op Pain Management:    Induction: Intravenous  Airway Management Planned: Simple Face Mask  Additional Equipment:   Intra-op Plan:   Post-operative Plan:   Informed Consent: I have reviewed the patients History and Physical, chart, labs and discussed the procedure including the risks, benefits and alternatives for the proposed anesthesia with the patient or authorized representative who has indicated his/her understanding and acceptance.   Dental advisory given  Plan Discussed with: CRNA and Surgeon  Anesthesia Plan Comments:         Anesthesia Quick Evaluation

## 2015-04-20 ENCOUNTER — Encounter (HOSPITAL_COMMUNITY): Payer: Self-pay | Admitting: Gastroenterology

## 2015-04-23 ENCOUNTER — Ambulatory Visit (HOSPITAL_COMMUNITY): Payer: 59

## 2015-04-25 ENCOUNTER — Ambulatory Visit (HOSPITAL_COMMUNITY): Payer: 59

## 2015-04-25 ENCOUNTER — Ambulatory Visit (HOSPITAL_COMMUNITY): Admission: RE | Admit: 2015-04-25 | Payer: 59 | Source: Ambulatory Visit

## 2015-04-25 ENCOUNTER — Other Ambulatory Visit (HOSPITAL_COMMUNITY): Payer: Self-pay | Admitting: Gastroenterology

## 2015-04-25 ENCOUNTER — Ambulatory Visit (HOSPITAL_COMMUNITY)
Admission: RE | Admit: 2015-04-25 | Discharge: 2015-04-25 | Disposition: A | Discharge status: Home / Self Care | Payer: 59 | Source: Ambulatory Visit | Attending: Gastroenterology | Admitting: Gastroenterology

## 2015-04-25 DIAGNOSIS — R1013 Epigastric pain: Secondary | ICD-10-CM | POA: Diagnosis present

## 2015-04-25 DIAGNOSIS — R52 Pain, unspecified: Secondary | ICD-10-CM

## 2015-04-25 DIAGNOSIS — K449 Diaphragmatic hernia without obstruction or gangrene: Secondary | ICD-10-CM | POA: Diagnosis not present

## 2015-05-04 ENCOUNTER — Encounter (HOSPITAL_COMMUNITY)
Admission: RE | Disposition: A | Discharge status: Home / Self Care | Payer: Self-pay | Source: Ambulatory Visit | Attending: Gastroenterology

## 2015-05-04 ENCOUNTER — Ambulatory Visit (HOSPITAL_COMMUNITY)
Admission: RE | Admit: 2015-05-04 | Discharge: 2015-05-04 | Disposition: A | Discharge status: Home / Self Care | Payer: 59 | Source: Ambulatory Visit | Attending: Gastroenterology | Admitting: Gastroenterology

## 2015-05-04 DIAGNOSIS — M1990 Unspecified osteoarthritis, unspecified site: Secondary | ICD-10-CM | POA: Diagnosis not present

## 2015-05-04 DIAGNOSIS — R1310 Dysphagia, unspecified: Secondary | ICD-10-CM | POA: Diagnosis not present

## 2015-05-04 DIAGNOSIS — K449 Diaphragmatic hernia without obstruction or gangrene: Secondary | ICD-10-CM | POA: Diagnosis not present

## 2015-05-04 DIAGNOSIS — R112 Nausea with vomiting, unspecified: Secondary | ICD-10-CM | POA: Diagnosis not present

## 2015-05-04 DIAGNOSIS — K219 Gastro-esophageal reflux disease without esophagitis: Secondary | ICD-10-CM | POA: Diagnosis not present

## 2015-05-04 DIAGNOSIS — R1013 Epigastric pain: Secondary | ICD-10-CM | POA: Diagnosis not present

## 2015-05-04 HISTORY — PX: ESOPHAGEAL MANOMETRY: SHX5429

## 2015-05-04 SURGERY — MANOMETRY, ESOPHAGUS

## 2015-05-04 MED ORDER — LIDOCAINE VISCOUS 2 % MT SOLN
OROMUCOSAL | Status: AC
  Filled 2015-05-04: qty 15

## 2015-05-04 SURGICAL SUPPLY — 2 items
FACESHIELD LNG OPTICON STERILE (SAFETY) IMPLANT
GLOVE BIO SURGEON STRL SZ8 (GLOVE) ×4 IMPLANT

## 2015-05-07 ENCOUNTER — Encounter (HOSPITAL_COMMUNITY): Payer: Self-pay | Admitting: Gastroenterology

## 2015-05-09 ENCOUNTER — Ambulatory Visit (HOSPITAL_COMMUNITY): Payer: 59

## 2015-05-14 ENCOUNTER — Ambulatory Visit: Payer: Self-pay | Admitting: General Surgery

## 2015-05-14 NOTE — H&P (Signed)
History of Present Illness Ralene Ok MD; 05/14/2015 9:33 AM) Patient words: hernia.  The patient is a 64 year old female who presents with a hiatal hernia. Patient is a 64 year old female who is referred by Dr. Wynetta Emery for evaluation of a moderate size hiatal hernia. Patient states she started having symptoms approximately 4 months ago. She states that she has epigastric pain followed by nausea and vomiting as well as dysphagia. She states that she has more dysphagia with solid food. She states liquids are easier to pass.  Patient is been worked up with an upper GI which reveals a moderate size hiatal hernia. Patient also has had endoscopy which reveals same. Patient had a manometry done however results are not available at this time. Patient has a previous history of cholecystectomy as well as spinal surgery secondary to scoliosis.   Other Problems Marjean Donna, CMA; 05/14/2015 8:40 AM) Arthritis Back Pain Bladder Problems Cholelithiasis Gastric Ulcer Gastroesophageal Reflux Disease General anesthesia - complications Hemorrhoids Lump In Breast Migraine Headache Other disease, cancer, significant illness Vascular Disease  Past Surgical History Marjean Donna, CMA; 05/14/2015 8:40 AM) Breast Biopsy Left. Gallbladder Surgery - Laparoscopic Hysterectomy (not due to cancer) - Partial Knee Surgery Left. Mammoplasty; Reduction Bilateral. Shoulder Surgery Right. Spinal Surgery Midback  Diagnostic Studies History Marjean Donna, CMA; 05/14/2015 8:40 AM) Colonoscopy within last year Mammogram within last year Pap Smear 1-5 years ago  Allergies Marjean Donna, CMA; 05/14/2015 8:42 AM) Zantac *ULCER DRUGS* Hydrocodone-Acetaminophen *ANALGESICS - OPIOID* Morphine Sulfate (Concentrate) *ANALGESICS - OPIOID* PredniSONE (Pak) *CORTICOSTEROIDS* TraMADol HCl *ANALGESICS - OPIOID*  Medication History (Sonya Bynum, CMA; 05/14/2015 8:43 AM) Pantoprazole Sodium (40MG   Tablet DR, Oral) Active. Vitamin D (Ergocalciferol) (50000UNIT Capsule, Oral) Active. Furosemide (20MG  Tablet, Oral) Active. Excedrin Migraine (250-250-65MG  Tablet, Oral) Active. Multivitamins (Oral) Active. Medications Reconciled  Social History ([place]; 05/14/2015 8:40 AM) Alcohol use Occasional alcohol use. Caffeine use Coffee, Tea. No drug use Tobacco use Former smoker.  Family History Marjean Donna, CMA; 05/14/2015 8:40 AM) Alcohol Abuse Brother. Arthritis Father, Sister. Heart Disease Sister. Heart disease in female family member before age 19 Migraine Headache Sister. Thyroid problems Sister.  Pregnancy / Birth History Marjean Donna, [place]; 05/14/2015 8:40 AM) Age at menarche 3 years. Age of menopause 28-55 Gravida 1 Maternal age 31-30 Para 1  Review of Systems ([name]; 05/14/2015 8:40 AM) General Not Present- Appetite Loss, Chills, Fatigue, Fever, Night Sweats, Weight Gain and Weight Loss. Skin Not Present- Change in Wart/Mole, Dryness, Hives, Jaundice, New Lesions, Non-Healing Wounds, Rash and Ulcer. HEENT Present- Seasonal Allergies and Wears glasses/contact lenses. Not Present- Earache, Hearing Loss, Hoarseness, Nose Bleed, Oral Ulcers, Ringing in the Ears, Sinus Pain, Sore Throat, Visual Disturbances and Yellow Eyes. Respiratory Present- Wheezing. Not Present- Bloody sputum, Chronic Cough, Difficulty Breathing and Snoring. Breast Not Present- Breast Mass, Breast Pain, Nipple Discharge and Skin Changes. Cardiovascular Present- Leg Cramps and Swelling of Extremities. Not Present- Chest Pain, Difficulty Breathing Lying Down, Palpitations, Rapid Heart Rate and Shortness of Breath. Gastrointestinal Present- Abdominal Pain. Not Present- Bloating, Bloody Stool, Change in Bowel Habits, Chronic diarrhea, Constipation, Difficulty Swallowing, Excessive gas, Gets full quickly at meals, Hemorrhoids, Indigestion, Nausea, Rectal Pain and Vomiting. Female  Genitourinary Present- Urgency. Not Present- Frequency, Nocturia, Painful Urination and Pelvic Pain. Musculoskeletal Present- Swelling of Extremities. Not Present- Back Pain, Joint Pain, Joint Stiffness, Muscle Pain and Muscle Weakness. Neurological Present- Headaches. Not Present- Decreased Memory, Fainting, Numbness, Seizures, Tingling, Tremor, Trouble walking and Weakness. Hematology Present- Easy Bruising. Not  Present- Excessive bleeding, Gland problems, HIV and Persistent Infections.   Vitals (Sonya Bynum CMA; 05/14/2015 8:41 AM) 05/14/2015 8:41 AM Weight: 150 lb Height: 57in Body Surface Area: 1.65 m Body Mass Index: 32.46 kg/m Temp.: 97.34F(Temporal)  Pulse: 77 (Regular)  BP: 128/80 (Sitting, Left Arm, Standard)    Physical Exam Ralene Ok MD; 05/14/2015 9:34 AM) General Mental Status-Alert. General Appearance-Consistent with stated age. Hydration-Well hydrated. Voice-Normal.  Head and Neck Head-normocephalic, atraumatic with no lesions or palpable masses. Trachea-midline. Thyroid Gland Characteristics - normal size and consistency.  Chest and Lung Exam Chest and lung exam reveals -quiet, even and easy respiratory effort with no use of accessory muscles and on auscultation, normal breath sounds, no adventitious sounds and normal vocal resonance. Inspection Chest Wall - Normal. Back - normal.  Cardiovascular Cardiovascular examination reveals -normal heart sounds, regular rate and rhythm with no murmurs and normal pedal pulses bilaterally.  Abdomen Inspection Inspection of the abdomen reveals - No Hernias. Skin - Scar - Note: Laparoscopic cholecystectomy scars. Palpation/Percussion Palpation and Percussion of the abdomen reveal - Soft, Non Tender, No Rebound tenderness, No Rigidity (guarding) and No hepatosplenomegaly. Auscultation Auscultation of the abdomen reveals - Bowel sounds normal.    Assessment & Plan Ralene Ok MD;  05/14/2015 9:36 AM) HIATAL HERNIA (553.3  K44.9) Impression: The patient is a 64 year old female with a symptomatic, moderate-sized hiatal hernia.  1. We will proceed to the OR for robotic had a hernia repair and Nissen fundoplication.  2. Discuss with the patient the risks and benefits of the procedure to include but not limited to: Infection, bleeding, damage to structures, possible esophageal leak, possible pneumothorax, possible recurrence of hiatal hernia and slipped Nissen. The patient was understanding and wished to proceed.

## 2015-05-15 NOTE — Op Note (Signed)
  Problem: Postprandial epigastric abdominal pain leading to nausea and vomiting. Intermittent esophageal dysphagia. Large paraesophageal hiatal hernia. 04/19/2015 diagnostic esophagogastroduodenoscopy was normal except for a moderate sized hiatal hernia. 04/19/2015 normal screening colonoscopy. Barium upper GI x-ray showed a large paraesophageal hiatal hernia  History: The patient is a 64 year old female born 1951/01/26. She is scheduled to undergo a diagnostic high resolution esophageal manometry. She has a symptomatic, large paraesophageal hiatal hernia demonstrated by diagnostic esophagogastroduodenoscopy and barium upper GI x-ray series.  Past medical history: Gastroesophageal reflux associated with a large paraesophageal hiatal hernia without Barrett's esophagus. Osteoarthritis. Cystocele. Rectocele. Scoliosis. Breast reduction surgery. Cholecystectomy. Vaginal hysterectomy. Hemorrhoidectomy. Left knee surgery. Rotator cuff repair surgery.  High resolution esophageal manometry findings.  #1. Normal upper esophageal sphincter function  #2. Normal lower esophageal sphincter function including a basal pressure 28.7 mmHg with normal relaxation.  #3. Normal esophageal body peristaltic contractions based on 10 wet swallows.  Assessment: Normal high resolution esophageal manometry  Recommendation: Schedule consultation with surgery to treat her large, symptomatic paraesophageal hernia

## 2015-05-18 ENCOUNTER — Other Ambulatory Visit: Payer: Self-pay

## 2015-05-18 DIAGNOSIS — Z1231 Encounter for screening mammogram for malignant neoplasm of breast: Secondary | ICD-10-CM

## 2015-05-22 ENCOUNTER — Other Ambulatory Visit: Payer: Self-pay | Admitting: Family Medicine

## 2015-05-22 DIAGNOSIS — M8580 Other specified disorders of bone density and structure, unspecified site: Secondary | ICD-10-CM

## 2015-06-20 NOTE — Patient Instructions (Addendum)
[place]IA  06/20/2015   Your procedure is scheduled on: Friday 06/29/2015  Report to [facility] Main  Entrance take [place]  elevators to 3rd floor to  [place] at  [place] AM.  Call this number if you have problems the morning of surgery [phone]   Remember: ONLY 1 PERSON MAY GO WITH YOU TO SHORT STAY TO GET  READY MORNING OF [place].  Do not eat food or drink liquids :After Midnight.     Take these medicines the morning of surgery with A SIP OF WATER: Protonix                               You may not have any metal on your body including hair pins and              piercings  Do not wear jewelry, make-up, lotions, powders or perfumes, deodorant             Do not wear nail polish.  Do not shave  48 hours prior to surgery.            .   Do not bring valuables to the hospital. [place].  Contacts, dentures or bridgework may not be worn into surgery.  Leave suitcase in the car. After surgery it may be brought to your room.       Special Instructions: coughing and deep breathing exercises, leg exercises              Please read over the following fact sheets you were given: _____________________________________________________________________             [facility] - Preparing for Surgery Before surgery, you can play an important role.  Because skin is not sterile, your skin needs to be as free of germs as possible.  You can reduce the number of germs on your skin by washing with CHG (chlorahexidine gluconate) soap before surgery.  CHG is an antiseptic cleaner which kills germs and bonds with the skin to continue killing germs even after washing. Please DO NOT use if you have an allergy to CHG or antibacterial soaps.  If your skin becomes reddened/irritated stop using the CHG and inform your nurse when you arrive at Short Stay. Do not shave (including legs and underarms) for at least 48  hours prior to the first CHG shower.  You may shave your face/neck. Please follow these instructions carefully:  1.  Shower with CHG Soap the night before surgery and the  morning of Surgery.  2.  If you choose to wash your hair, wash your hair first as usual with your  normal  shampoo.  3.  After you shampoo, rinse your hair and body thoroughly to remove the  shampoo.                           4.  Use CHG as you would any other liquid soap.  You can apply chg directly  to the skin and wash                       Gently with a scrungie or clean washcloth.  5.  Apply  the CHG Soap to your body ONLY FROM THE NECK DOWN.   Do not use on face/ open                           Wound or open sores. Avoid contact with eyes, ears mouth and genitals (private parts).                       Wash face,  Genitals (private parts) with your normal soap.             6.  Wash thoroughly, paying special attention to the area where your surgery  will be performed.  7.  Thoroughly rinse your body with warm water from the neck down.  8.  DO NOT shower/wash with your normal soap after using and rinsing off  the CHG Soap.                9.  Pat yourself dry with a clean towel.            10.  Wear clean pajamas.            11.  Place clean sheets on your bed the night of your first shower and do not  sleep with pets. Day of Surgery : Do not apply any lotions/deodorants the morning of surgery.  Please wear clean clothes to the hospital/surgery center.  FAILURE TO FOLLOW THESE INSTRUCTIONS MAY RESULT IN THE CANCELLATION OF YOUR SURGERY PATIENT SIGNATURE_________________________________  NURSE SIGNATURE__________________________________  ________________________________________________________________________

## 2015-06-21 ENCOUNTER — Other Ambulatory Visit: Payer: Self-pay | Admitting: Internal Medicine

## 2015-06-21 ENCOUNTER — Other Ambulatory Visit: Payer: 59

## 2015-06-21 ENCOUNTER — Encounter (HOSPITAL_COMMUNITY): Payer: Self-pay

## 2015-06-21 ENCOUNTER — Encounter (HOSPITAL_COMMUNITY)
Admission: RE | Admit: 2015-06-21 | Discharge: 2015-06-21 | Disposition: A | Discharge status: Home / Self Care | Payer: 59 | Source: Ambulatory Visit | Attending: General Surgery | Admitting: General Surgery

## 2015-06-21 ENCOUNTER — Ambulatory Visit
Admission: RE | Admit: 2015-06-21 | Discharge: 2015-06-21 | Disposition: A | Discharge status: Home / Self Care | Payer: 59 | Source: Ambulatory Visit

## 2015-06-21 DIAGNOSIS — Z01818 Encounter for other preprocedural examination: Secondary | ICD-10-CM | POA: Insufficient documentation

## 2015-06-21 DIAGNOSIS — Z1231 Encounter for screening mammogram for malignant neoplasm of breast: Secondary | ICD-10-CM

## 2015-06-21 HISTORY — DX: Family history of other specified conditions: Z84.89

## 2015-06-21 LAB — CBC
HCT: 34.6 % — ABNORMAL LOW (ref 36.0–46.0)
Hemoglobin: 10.4 g/dL — ABNORMAL LOW (ref 12.0–15.0)
MCH: 22.4 pg — ABNORMAL LOW (ref 26.0–34.0)
MCHC: 30.1 g/dL (ref 30.0–36.0)
MCV: 74.6 fL — ABNORMAL LOW (ref 78.0–100.0)
Platelets: 285 10*3/uL (ref 150–400)
RBC: 4.64 MIL/uL (ref 3.87–5.11)
RDW: 15.9 % — ABNORMAL HIGH (ref 11.5–15.5)
WBC: 6 10*3/uL (ref 4.0–10.5)

## 2015-06-21 LAB — BASIC METABOLIC PANEL
Anion gap: 6 (ref 5–15)
BUN: 13 mg/dL (ref 6–20)
CO2: 28 mmol/L (ref 22–32)
Calcium: 9 mg/dL (ref 8.9–10.3)
Chloride: 106 mmol/L (ref 101–111)
Creatinine, Ser: 0.65 mg/dL (ref 0.44–1.00)
GFR calc Af Amer: >60 mL/min (ref >60)
GFR calc non Af Amer: >60 mL/min (ref >60)
Glucose, Bld: 96 mg/dL (ref 65–99)
Potassium: 3.9 mmol/L (ref 3.5–5.1)
Sodium: 140 mmol/L (ref 135–145)

## 2015-06-21 LAB — ABO/RH: ABO/RH(D): O NEG

## 2015-06-21 NOTE — Progress Notes (Addendum)
04-25-15 - UGI w/KUB - EPIC 03-29-15 - LOV - Dr. Melvyn Novas (pulmon.) - EPIC 03-13-15- CXR - EPIC 12-06-14 - Lower Extremity Venous Duplex - EPIC  08-11-14 - 2D echo - EPIC  08-01-14 - LOV - Dr. Fletcher Anon (cardio) - EPIC 08-01-14 - EKG - EPIC 07-21-14 - LOV - Dr. Bridgett Larsson (vasc.surg.) - EPIC

## 2015-06-22 ENCOUNTER — Other Ambulatory Visit: Payer: Self-pay | Admitting: Family Medicine

## 2015-06-22 DIAGNOSIS — R928 Other abnormal and inconclusive findings on diagnostic imaging of breast: Secondary | ICD-10-CM

## 2015-06-28 ENCOUNTER — Ambulatory Visit
Admission: RE | Admit: 2015-06-28 | Discharge: 2015-06-28 | Disposition: A | Discharge status: Home / Self Care | Payer: 59 | Source: Ambulatory Visit | Attending: Family Medicine | Admitting: Family Medicine

## 2015-06-28 DIAGNOSIS — R928 Other abnormal and inconclusive findings on diagnostic imaging of breast: Secondary | ICD-10-CM

## 2015-06-28 NOTE — Anesthesia Preprocedure Evaluation (Addendum)
Anesthesia Evaluation  Patient identified by MRN, date of birth, ID band Patient awake    Reviewed: Allergy & Precautions, NPO status , Patient's Chart, lab work & pertinent test results  History of Anesthesia Complications (+) PONV  Airway Mallampati: II  TM Distance: >3 FB Neck ROM: Full    Dental  (+) Teeth Intact   Pulmonary former smoker,    breath sounds clear to auscultation       Cardiovascular + Peripheral Vascular Disease   Rhythm:Regular Rate:Normal     Neuro/Psych  Headaches, negative psych ROS   GI/Hepatic GERD  Medicated,  Endo/Other  negative endocrine ROS  Renal/GU negative Renal ROS  negative genitourinary   Musculoskeletal  (+) Arthritis , Osteoarthritis,    Abdominal   Peds negative pediatric ROS (+)  Hematology negative hematology ROS (+)   Anesthesia Other Findings   Reproductive/Obstetrics                            Lab Results  Component Value Date   WBC 6.0 06/21/2015   HGB 10.4* 06/21/2015   HCT 34.6* 06/21/2015   MCV 74.6* 06/21/2015   PLT 285 06/21/2015   Lab Results  Component Value Date   CREATININE 0.65 06/21/2015   BUN 13 06/21/2015   NA 140 06/21/2015   K 3.9 06/21/2015   CL 106 06/21/2015   CO2 28 06/21/2015   No results found for: INR, PROTIME  2015 EKG: normal sinus rhythm.   Anesthesia Physical Anesthesia Plan  ASA: II  Anesthesia Plan: General   Post-op Pain Management:    Induction: Intravenous, Rapid sequence and Cricoid pressure planned  Airway Management Planned: Oral ETT  Additional Equipment:   Intra-op Plan:   Post-operative Plan: Extubation in OR  Informed Consent: I have reviewed the patients History and Physical, chart, labs and discussed the procedure including the risks, benefits and alternatives for the proposed anesthesia with the patient or authorized representative who has indicated his/her understanding  and acceptance.   Dental advisory given  Plan Discussed with:   Anesthesia Plan Comments:         Anesthesia Quick Evaluation

## 2015-06-29 ENCOUNTER — Encounter (HOSPITAL_COMMUNITY)
Admission: AD | Disposition: A | Discharge status: Home / Self Care | Payer: Self-pay | Source: Ambulatory Visit | Attending: General Surgery

## 2015-06-29 ENCOUNTER — Inpatient Hospital Stay (HOSPITAL_COMMUNITY): Payer: 59 | Admitting: Anesthesiology

## 2015-06-29 ENCOUNTER — Encounter (HOSPITAL_COMMUNITY): Payer: Self-pay

## 2015-06-29 ENCOUNTER — Inpatient Hospital Stay (HOSPITAL_COMMUNITY): Payer: 59

## 2015-06-29 ENCOUNTER — Inpatient Hospital Stay (HOSPITAL_COMMUNITY)
Admission: AD | Admit: 2015-06-29 | Discharge: 2015-07-02 | DRG: 328 | Disposition: A | Discharge status: Home / Self Care | Payer: 59 | Source: Ambulatory Visit | Attending: General Surgery | Admitting: General Surgery

## 2015-06-29 DIAGNOSIS — Z885 Allergy status to narcotic agent status: Secondary | ICD-10-CM

## 2015-06-29 DIAGNOSIS — Z79899 Other long term (current) drug therapy: Secondary | ICD-10-CM

## 2015-06-29 DIAGNOSIS — R0602 Shortness of breath: Secondary | ICD-10-CM

## 2015-06-29 DIAGNOSIS — G43909 Migraine, unspecified, not intractable, without status migrainosus: Secondary | ICD-10-CM | POA: Diagnosis present

## 2015-06-29 DIAGNOSIS — R1310 Dysphagia, unspecified: Secondary | ICD-10-CM | POA: Diagnosis present

## 2015-06-29 DIAGNOSIS — E669 Obesity, unspecified: Secondary | ICD-10-CM | POA: Diagnosis present

## 2015-06-29 DIAGNOSIS — Z888 Allergy status to other drugs, medicaments and biological substances status: Secondary | ICD-10-CM

## 2015-06-29 DIAGNOSIS — K259 Gastric ulcer, unspecified as acute or chronic, without hemorrhage or perforation: Secondary | ICD-10-CM | POA: Diagnosis present

## 2015-06-29 DIAGNOSIS — Z01812 Encounter for preprocedural laboratory examination: Secondary | ICD-10-CM

## 2015-06-29 DIAGNOSIS — Z9049 Acquired absence of other specified parts of digestive tract: Secondary | ICD-10-CM | POA: Diagnosis present

## 2015-06-29 DIAGNOSIS — K219 Gastro-esophageal reflux disease without esophagitis: Secondary | ICD-10-CM | POA: Diagnosis present

## 2015-06-29 DIAGNOSIS — Z6832 Body mass index (BMI) 32.0-32.9, adult: Secondary | ICD-10-CM

## 2015-06-29 DIAGNOSIS — Z8249 Family history of ischemic heart disease and other diseases of the circulatory system: Secondary | ICD-10-CM | POA: Diagnosis not present

## 2015-06-29 DIAGNOSIS — M1990 Unspecified osteoarthritis, unspecified site: Secondary | ICD-10-CM | POA: Diagnosis present

## 2015-06-29 DIAGNOSIS — Z9889 Other specified postprocedural states: Secondary | ICD-10-CM

## 2015-06-29 DIAGNOSIS — K449 Diaphragmatic hernia without obstruction or gangrene: Principal | ICD-10-CM | POA: Diagnosis present

## 2015-06-29 HISTORY — DX: Scoliosis, unspecified: M41.9

## 2015-06-29 LAB — TYPE AND SCREEN
ABO/RH(D): O NEG
Antibody Screen: NEGATIVE

## 2015-06-29 SURGERY — FUNDOPLICATION, NISSEN, ROBOT-ASSISTED, LAPAROSCOPIC
Anesthesia: General

## 2015-06-29 MED ORDER — METOCLOPRAMIDE HCL 5 MG/ML IJ SOLN: 10.0000 mg | Freq: Four times a day (QID) | INTRAMUSCULAR | Status: DC | PRN

## 2015-06-29 MED ORDER — PHENYLEPHRINE HCL 10 MG/ML IJ SOLN
INTRAMUSCULAR | Status: AC
  Filled 2015-06-29: qty 1

## 2015-06-29 MED ORDER — DIPHENHYDRAMINE HCL 12.5 MG/5ML PO ELIX: 12.5000 mg | ORAL_SOLUTION | Freq: Four times a day (QID) | ORAL | Status: DC | PRN

## 2015-06-29 MED ORDER — FENTANYL CITRATE (PF) 100 MCG/2ML IJ SOLN
25.0000 ug | INTRAMUSCULAR | Status: DC | PRN
Start: 2015-06-29 — End: 2015-06-29
  Administered 2015-06-29: 25 ug via INTRAVENOUS

## 2015-06-29 MED ORDER — SCOPOLAMINE 1 MG/3DAYS TD PT72
MEDICATED_PATCH | TRANSDERMAL | Status: AC
  Filled 2015-06-29: qty 1

## 2015-06-29 MED ORDER — ACETAMINOPHEN 10 MG/ML IV SOLN
INTRAVENOUS | Status: DC | PRN
  Administered 2015-06-29: 1000 mg via INTRAVENOUS

## 2015-06-29 MED ORDER — HYDROMORPHONE HCL 1 MG/ML IJ SOLN
1.0000 mg | INTRAMUSCULAR | Status: DC | PRN
  Administered 2015-06-29 – 2015-07-01 (×7): 1 mg via INTRAVENOUS
  Filled 2015-06-29 (×7): qty 1

## 2015-06-29 MED ORDER — ATROPINE SULFATE 0.4 MG/ML IJ SOLN
INTRAMUSCULAR | Status: AC
  Filled 2015-06-29: qty 1

## 2015-06-29 MED ORDER — PROMETHAZINE HCL 25 MG/ML IJ SOLN
6.2500 mg | INTRAMUSCULAR | Status: AC | PRN
  Administered 2015-06-29: 12.5 mg via INTRAVENOUS
  Administered 2015-06-29: 6.25 mg via INTRAVENOUS

## 2015-06-29 MED ORDER — SUGAMMADEX SODIUM 200 MG/2ML IV SOLN
INTRAVENOUS | Status: AC
  Filled 2015-06-29: qty 2

## 2015-06-29 MED ORDER — CEFAZOLIN SODIUM-DEXTROSE 2-3 GM-% IV SOLR
INTRAVENOUS | Status: AC
  Filled 2015-06-29: qty 50

## 2015-06-29 MED ORDER — MIDAZOLAM HCL 5 MG/5ML IJ SOLN
INTRAMUSCULAR | Status: DC | PRN
  Administered 2015-06-29 (×2): 1 mg via INTRAVENOUS

## 2015-06-29 MED ORDER — MIDAZOLAM HCL 2 MG/2ML IJ SOLN
INTRAMUSCULAR | Status: AC
  Filled 2015-06-29: qty 4

## 2015-06-29 MED ORDER — FUROSEMIDE 10 MG/ML IJ SOLN
20.0000 mg | Freq: Every day | INTRAMUSCULAR | Status: DC
  Administered 2015-06-29 – 2015-06-30 (×2): 20 mg via INTRAVENOUS
  Filled 2015-06-29 (×3): qty 2

## 2015-06-29 MED ORDER — PROPOFOL 500 MG/50ML IV EMUL
INTRAVENOUS | Status: DC | PRN
  Administered 2015-06-29: 20 ug/kg/min via INTRAVENOUS

## 2015-06-29 MED ORDER — DEXTROSE-NACL 5-0.9 % IV SOLN
INTRAVENOUS | Status: DC
  Administered 2015-06-29 – 2015-07-01 (×5): via INTRAVENOUS

## 2015-06-29 MED ORDER — LACTATED RINGERS IV SOLN: INTRAVENOUS | Status: DC

## 2015-06-29 MED ORDER — SODIUM CHLORIDE 0.9 % IJ SOLN
INTRAMUSCULAR | Status: AC
Start: 2015-06-29 — End: 2015-06-29
  Filled 2015-06-29: qty 10

## 2015-06-29 MED ORDER — GLYCOPYRROLATE 0.2 MG/ML IJ SOLN
INTRAMUSCULAR | Status: DC | PRN
  Administered 2015-06-29: 0.2 mg via INTRAVENOUS

## 2015-06-29 MED ORDER — CHLORHEXIDINE GLUCONATE 4 % EX LIQD: 1.0000 "application " | Freq: Once | CUTANEOUS | Status: DC

## 2015-06-29 MED ORDER — ROCURONIUM BROMIDE 100 MG/10ML IV SOLN
INTRAVENOUS | Status: DC | PRN
  Administered 2015-06-29 (×2): 10 mg via INTRAVENOUS
  Administered 2015-06-29: 40 mg via INTRAVENOUS
  Administered 2015-06-29 (×2): 10 mg via INTRAVENOUS

## 2015-06-29 MED ORDER — PROPOFOL 10 MG/ML IV BOLUS
INTRAVENOUS | Status: AC
  Filled 2015-06-29: qty 20

## 2015-06-29 MED ORDER — FENTANYL CITRATE (PF) 100 MCG/2ML IJ SOLN
INTRAMUSCULAR | Status: DC | PRN
  Administered 2015-06-29 (×10): 50 ug via INTRAVENOUS

## 2015-06-29 MED ORDER — FENTANYL CITRATE (PF) 250 MCG/5ML IJ SOLN
INTRAMUSCULAR | Status: AC
  Filled 2015-06-29: qty 25

## 2015-06-29 MED ORDER — GLYCOPYRROLATE 0.2 MG/ML IJ SOLN
INTRAMUSCULAR | Status: AC
  Filled 2015-06-29: qty 3

## 2015-06-29 MED ORDER — MEPERIDINE HCL 50 MG/ML IJ SOLN: 6.2500 mg | INTRAMUSCULAR | Status: DC | PRN

## 2015-06-29 MED ORDER — ONDANSETRON HCL 4 MG/2ML IJ SOLN
INTRAMUSCULAR | Status: AC
  Filled 2015-06-29: qty 2

## 2015-06-29 MED ORDER — BUPIVACAINE-EPINEPHRINE (PF) 0.25% -1:200000 IJ SOLN
INTRAMUSCULAR | Status: AC
  Filled 2015-06-29: qty 30

## 2015-06-29 MED ORDER — CEFAZOLIN SODIUM-DEXTROSE 2-3 GM-% IV SOLR
2.0000 g | INTRAVENOUS | Status: AC
  Administered 2015-06-29: 2 g via INTRAVENOUS

## 2015-06-29 MED ORDER — PROMETHAZINE HCL 25 MG/ML IJ SOLN
INTRAMUSCULAR | Status: AC
  Filled 2015-06-29: qty 1

## 2015-06-29 MED ORDER — ARTIFICIAL TEARS OP OINT
TOPICAL_OINTMENT | OPHTHALMIC | Status: AC
  Filled 2015-06-29: qty 3.5

## 2015-06-29 MED ORDER — TISSEEL VH 10 ML EX KIT
PACK | CUTANEOUS | Status: AC
  Filled 2015-06-29: qty 1

## 2015-06-29 MED ORDER — LACTATED RINGERS IR SOLN
Status: DC | PRN
  Administered 2015-06-29: 1000 mL

## 2015-06-29 MED ORDER — LIDOCAINE HCL (CARDIAC) 20 MG/ML IV SOLN
INTRAVENOUS | Status: DC | PRN
  Administered 2015-06-29: 80 mg via INTRAVENOUS

## 2015-06-29 MED ORDER — BUPIVACAINE-EPINEPHRINE 0.25% -1:200000 IJ SOLN
INTRAMUSCULAR | Status: DC | PRN
  Administered 2015-06-29: 14 mL

## 2015-06-29 MED ORDER — ONDANSETRON HCL 4 MG/2ML IJ SOLN
INTRAMUSCULAR | Status: DC | PRN
  Administered 2015-06-29: 4 mg via INTRAVENOUS

## 2015-06-29 MED ORDER — SUCCINYLCHOLINE CHLORIDE 20 MG/ML IJ SOLN
INTRAMUSCULAR | Status: DC | PRN
  Administered 2015-06-29: 100 mg via INTRAVENOUS

## 2015-06-29 MED ORDER — DIPHENHYDRAMINE HCL 50 MG/ML IJ SOLN: 12.5000 mg | Freq: Four times a day (QID) | INTRAMUSCULAR | Status: DC | PRN

## 2015-06-29 MED ORDER — LACTATED RINGERS IV SOLN
INTRAVENOUS | Status: DC | PRN
  Administered 2015-06-29 (×3): via INTRAVENOUS

## 2015-06-29 MED ORDER — PROPOFOL 10 MG/ML IV BOLUS
INTRAVENOUS | Status: DC | PRN
  Administered 2015-06-29: 130 mg via INTRAVENOUS

## 2015-06-29 MED ORDER — FENTANYL CITRATE (PF) 100 MCG/2ML IJ SOLN
INTRAMUSCULAR | Status: AC
  Filled 2015-06-29: qty 2

## 2015-06-29 MED ORDER — SCOPOLAMINE 1 MG/3DAYS TD PT72
MEDICATED_PATCH | TRANSDERMAL | Status: DC | PRN
  Administered 2015-06-29: 1 via TRANSDERMAL

## 2015-06-29 MED ORDER — NEOSTIGMINE METHYLSULFATE 10 MG/10ML IV SOLN
INTRAVENOUS | Status: AC
  Filled 2015-06-29: qty 1

## 2015-06-29 MED ORDER — LIDOCAINE HCL (CARDIAC) 20 MG/ML IV SOLN
INTRAVENOUS | Status: AC
  Filled 2015-06-29: qty 5

## 2015-06-29 MED ORDER — PHENYLEPHRINE HCL 10 MG/ML IJ SOLN
INTRAMUSCULAR | Status: DC | PRN
  Administered 2015-06-29: 80 ug via INTRAVENOUS

## 2015-06-29 MED ORDER — EPHEDRINE SULFATE 50 MG/ML IJ SOLN
INTRAMUSCULAR | Status: AC
  Filled 2015-06-29: qty 1

## 2015-06-29 MED ORDER — ROCURONIUM BROMIDE 100 MG/10ML IV SOLN
INTRAVENOUS | Status: AC
  Filled 2015-06-29: qty 1

## 2015-06-29 MED ORDER — SUGAMMADEX SODIUM 200 MG/2ML IV SOLN
INTRAVENOUS | Status: DC | PRN
  Administered 2015-06-29: 200 mg via INTRAVENOUS

## 2015-06-29 MED ORDER — ENOXAPARIN SODIUM 40 MG/0.4ML ~~LOC~~ SOLN
40.0000 mg | SUBCUTANEOUS | Status: DC
  Administered 2015-06-30 – 2015-07-02 (×3): 40 mg via SUBCUTANEOUS
  Filled 2015-06-29 (×4): qty 0.4

## 2015-06-29 MED ORDER — PHENYLEPHRINE HCL 10 MG/ML IJ SOLN
10.0000 mg | INTRAVENOUS | Status: DC | PRN
  Administered 2015-06-29: 40 ug/min via INTRAVENOUS

## 2015-06-29 MED ORDER — STERILE WATER FOR IRRIGATION IR SOLN
Status: DC | PRN
  Administered 2015-06-29: 1000 mL

## 2015-06-29 MED ORDER — ACETAMINOPHEN 10 MG/ML IV SOLN
INTRAVENOUS | Status: AC
  Filled 2015-06-29: qty 100

## 2015-06-29 SURGICAL SUPPLY — 65 items
APPLIER CLIP 5 13 M/L LIGAMAX5 (MISCELLANEOUS)
APPLIER CLIP ROT 10 11.4 M/L (STAPLE)
BENZOIN TINCTURE PRP APPL 2/3 (GAUZE/BANDAGES/DRESSINGS) IMPLANT
BLADE SURG SZ11 CARB STEEL (BLADE) ×2 IMPLANT
CHLORAPREP W/TINT 26ML (MISCELLANEOUS) ×2 IMPLANT
CLIP APPLIE 5 13 M/L LIGAMAX5 (MISCELLANEOUS) IMPLANT
CLIP APPLIE ROT 10 11.4 M/L (STAPLE) IMPLANT
CLIP LIGATING HEM O LOK PURPLE (MISCELLANEOUS) IMPLANT
CLIP LIGATING HEMO O LOK GREEN (MISCELLANEOUS) IMPLANT
COVER SURGICAL LIGHT HANDLE (MISCELLANEOUS) IMPLANT
COVER TIP SHEARS 8 DVNC (MISCELLANEOUS) ×1 IMPLANT
COVER TIP SHEARS 8MM DA VINCI (MISCELLANEOUS) ×1
DECANTER SPIKE VIAL GLASS SM (MISCELLANEOUS) ×2 IMPLANT
DEVICE TROCAR PUNCTURE CLOSURE (ENDOMECHANICALS) IMPLANT
DISSECTOR BLUNT TIP ENDO 5MM (MISCELLANEOUS) IMPLANT
DRAIN PENROSE 18X1/2 LTX STRL (DRAIN) ×2 IMPLANT
DRAPE ARM DVNC X/XI (DISPOSABLE) ×4 IMPLANT
DRAPE COLUMN DVNC XI (DISPOSABLE) ×1 IMPLANT
DRAPE DA VINCI XI ARM (DISPOSABLE) ×4
DRAPE DA VINCI XI COLUMN (DISPOSABLE) ×1
ELECT REM PT RETURN 9FT ADLT (ELECTROSURGICAL) ×2
ELECTRODE REM PT RTRN 9FT ADLT (ELECTROSURGICAL) ×1 IMPLANT
ENDOLOOP SUT PDS II  0 18 (SUTURE) ×2
ENDOLOOP SUT PDS II 0 18 (SUTURE) ×2 IMPLANT
GAUZE SPONGE 2X2 8PLY STRL LF (GAUZE/BANDAGES/DRESSINGS) IMPLANT
GAUZE SPONGE 4X4 16PLY XRAY LF (GAUZE/BANDAGES/DRESSINGS) ×2 IMPLANT
GLOVE BIO SURGEON STRL SZ7.5 (GLOVE) ×4 IMPLANT
GOWN STRL REUS W/TWL XL LVL3 (GOWN DISPOSABLE) ×8 IMPLANT
KIT BASIN OR (CUSTOM PROCEDURE TRAY) ×2 IMPLANT
LIQUID BAND (GAUZE/BANDAGES/DRESSINGS) ×2 IMPLANT
MESH HERNIA 7X10 (Mesh General) ×2 IMPLANT
NEEDLE HYPO 22GX1.5 SAFETY (NEEDLE) ×2 IMPLANT
NEEDLE INSUFFLATION 14GA 120MM (NEEDLE) ×2 IMPLANT
PACK CARDIOVASCULAR III (CUSTOM PROCEDURE TRAY) ×2 IMPLANT
PAD POSITIONING PINK XL (MISCELLANEOUS) ×2 IMPLANT
PEN SKIN MARKING BROAD (MISCELLANEOUS) ×2 IMPLANT
RELOAD STAPLE HERNIA 4.0 BLUE (INSTRUMENTS) IMPLANT
RELOAD STAPLE HERNIA 4.8 BLK (STAPLE) IMPLANT
SCISSORS LAP 5X35 DISP (ENDOMECHANICALS) ×2 IMPLANT
SEAL CANN UNIV 5-8 DVNC XI (MISCELLANEOUS) ×4 IMPLANT
SEAL XI 5MM-8MM UNIVERSAL (MISCELLANEOUS) ×4
SEALER VESSEL DA VINCI XI (MISCELLANEOUS) ×1
SEALER VESSEL EXT DVNC XI (MISCELLANEOUS) ×1 IMPLANT
SET IRRIG TUBING LAPAROSCOPIC (IRRIGATION / IRRIGATOR) ×2 IMPLANT
SHEARS HARMONIC ACE PLUS 36CM (ENDOMECHANICALS) IMPLANT
SLEEVE XCEL OPT CAN 5 100 (ENDOMECHANICALS) IMPLANT
SOLUTION ANTI FOG 6CC (MISCELLANEOUS) ×2 IMPLANT
SOLUTION ELECTROLUBE (MISCELLANEOUS) ×2 IMPLANT
SPONGE GAUZE 2X2 STER 10/PKG (GAUZE/BANDAGES/DRESSINGS)
STAPLER HERNIA 12 8.5 360D (INSTRUMENTS) IMPLANT
STAPLER VISISTAT 35W (STAPLE) ×2 IMPLANT
STRIP CLOSURE SKIN 1/2X4 (GAUZE/BANDAGES/DRESSINGS) IMPLANT
SUT ETHIBOND 2 0 SH (SUTURE)
SUT ETHIBOND 2 0 SH 36X2 (SUTURE) IMPLANT
SUT MNCRL AB 4-0 PS2 18 (SUTURE) ×2 IMPLANT
SUT SILK 2 0 SH (SUTURE) ×16 IMPLANT
SUT SILK 3 0 SH 30 (SUTURE) IMPLANT
SYR 20CC LL (SYRINGE) ×2 IMPLANT
SYRINGE 10CC LL (SYRINGE) ×2 IMPLANT
TOWEL OR 17X26 10 PK STRL BLUE (TOWEL DISPOSABLE) ×2 IMPLANT
TOWEL OR NON WOVEN STRL DISP B (DISPOSABLE) ×2 IMPLANT
TRAY FOLEY W/METER SILVER 14FR (SET/KITS/TRAYS/PACK) ×2 IMPLANT
TRAY FOLEY W/METER SILVER 16FR (SET/KITS/TRAYS/PACK) IMPLANT
TROCAR BLADELESS OPT 5 100 (ENDOMECHANICALS) ×2 IMPLANT
TUBING FILTER THERMOFLATOR (ELECTROSURGICAL) ×2 IMPLANT

## 2015-06-29 NOTE — Anesthesia Postprocedure Evaluation (Signed)
  Anesthesia Post-op Note  Patient: Meghan Avila  Procedure(s) Performed: Procedure(s): XI ROBOTIC ASSISTED LAPAROSCOPIC HIATAL HERNIA REPAIR WITH MESH AND NISSEN FUNDOPLICATION (N/A)  Patient Location: PACU  Anesthesia Type:General  Level of Consciousness: awake, alert  and oriented  Airway and Oxygen Therapy: Patient Spontanous Breathing  Post-op Pain: mild  Post-op Assessment: Post-op Vital signs reviewed and Patient's Cardiovascular Status Stable              Post-op Vital Signs: Reviewed and stable  Last Vitals:  Filed Vitals:   06/29/15 1302  BP: 118/71  Pulse: 81  Temp: 36.6 C  Resp: 18    Complications: No apparent anesthesia complications

## 2015-06-29 NOTE — Anesthesia Procedure Notes (Signed)
Procedure Name: Intubation Date/Time: 06/29/2015 7:41 AM Performed by: Montel Clock Pre-anesthesia Checklist: Patient identified, Emergency Drugs available, Suction available, Patient being monitored and Timeout performed Patient Re-evaluated:Patient Re-evaluated prior to inductionOxygen Delivery Method: Circle system utilized Preoxygenation: Pre-oxygenation with 100% oxygen Intubation Type: IV induction, Rapid sequence and Cricoid Pressure applied Laryngoscope Size: Mac and 3 Grade View: Grade I Tube type: Oral Tube size: 7.0 mm Number of attempts: 1 Airway Equipment and Method: Stylet Placement Confirmation: ETT inserted through vocal cords under direct vision,  positive ETCO2 and breath sounds checked- equal and bilateral Secured at: 18 cm Tube secured with: Tape Dental Injury: Teeth and Oropharynx as per pre-operative assessment

## 2015-06-29 NOTE — Transfer of Care (Signed)
Immediate Anesthesia Transfer of Care Note  Patient: Meghan Avila  Procedure(s) Performed: Procedure(s): XI ROBOTIC ASSISTED LAPAROSCOPIC HIATAL HERNIA REPAIR WITH MESH AND NISSEN FUNDOPLICATION (N/A)  Patient Location: PACU  Anesthesia Type:General  Level of Consciousness:  sedated, patient cooperative and responds to stimulation  Airway & Oxygen Therapy:Patient Spontanous Breathing and Patient connected to face mask oxgen  Post-op Assessment:  Report given to PACU RN and Post -op Vital signs reviewed and stable  Post vital signs:  Reviewed and stable  Last Vitals:  Filed Vitals:   06/29/15 0543  BP: 121/62  Pulse: 79  Temp: 36.6 C  Resp: 16    Complications: No apparent anesthesia complications

## 2015-06-29 NOTE — H&P (Signed)
History of Present Illness Meghan Ok MD; 05/14/2015 9:33 AM) Patient words: hernia.  The patient is a 64 year old female who presents with a hiatal hernia. Patient is a 64 year old female who is referred by Dr. Wynetta Emery for evaluation of a moderate size hiatal hernia. Patient states she started having symptoms approximately 4 months ago. She states that she has epigastric pain followed by nausea and vomiting as well as dysphagia. She states that she has more dysphagia with solid food. She states liquids are easier to pass.  Patient is been worked up with an upper GI which reveals a moderate size hiatal hernia. Patient also has had endoscopy which reveals same. Patient had a manometry done however results are not available at this time. Patient has a previous history of cholecystectomy as well as spinal surgery secondary to scoliosis.   Other Problems Marjean Donna, CMA; 05/14/2015 8:40 AM) Arthritis Back Pain Bladder Problems Cholelithiasis Gastric Ulcer Gastroesophageal Reflux Disease General anesthesia - complications Hemorrhoids Lump In Breast Migraine Headache Other disease, cancer, significant illness Vascular Disease  Past Surgical History Marjean Donna, CMA; 05/14/2015 8:40 AM) Breast Biopsy Left. Gallbladder Surgery - Laparoscopic Hysterectomy (not due to cancer) - Partial Knee Surgery Left. Mammoplasty; Reduction Bilateral. Shoulder Surgery Right. Spinal Surgery Midback  Diagnostic Studies History Marjean Donna, CMA; 05/14/2015 8:40 AM) Colonoscopy within last year Mammogram within last year Pap Smear 1-5 years ago  Allergies Marjean Donna, CMA; 05/14/2015 8:42 AM) Zantac *ULCER DRUGS* Hydrocodone-Acetaminophen *ANALGESICS - OPIOID* Morphine Sulfate (Concentrate) *ANALGESICS - OPIOID* PredniSONE (Pak) *CORTICOSTEROIDS* TraMADol HCl *ANALGESICS - OPIOID*  Medication History (Sonya Bynum, CMA; 05/14/2015 8:43 AM) Pantoprazole Sodium (40MG   Tablet DR, Oral) Active. Vitamin D (Ergocalciferol) (50000UNIT Capsule, Oral) Active. Furosemide (20MG  Tablet, Oral) Active. Excedrin Migraine (250-250-65MG  Tablet, Oral) Active. Multivitamins (Oral) Active. Medications Reconciled  Social History ([place]; 05/14/2015 8:40 AM) Alcohol use Occasional alcohol use. Caffeine use Coffee, Tea. No drug use Tobacco use Former smoker.  Family History Marjean Donna, CMA; 05/14/2015 8:40 AM) Alcohol Abuse Brother. Arthritis Father, Sister. Heart Disease Sister. Heart disease in female family member before age 19 Migraine Headache Sister. Thyroid problems Sister.  Pregnancy / Birth History Marjean Donna, [place]; 05/14/2015 8:40 AM) Age at menarche 3 years. Age of menopause 28-55 Gravida 1 Maternal age 31-30 Para 1  Review of Systems ([name]; 05/14/2015 8:40 AM) General Not Present- Appetite Loss, Chills, Fatigue, Fever, Night Sweats, Weight Gain and Weight Loss. Skin Not Present- Change in Wart/Mole, Dryness, Hives, Jaundice, New Lesions, Non-Healing Wounds, Rash and Ulcer. HEENT Present- Seasonal Allergies and Wears glasses/contact lenses. Not Present- Earache, Hearing Loss, Hoarseness, Nose Bleed, Oral Ulcers, Ringing in the Ears, Sinus Pain, Sore Throat, Visual Disturbances and Yellow Eyes. Respiratory Present- Wheezing. Not Present- Bloody sputum, Chronic Cough, Difficulty Breathing and Snoring. Breast Not Present- Breast Mass, Breast Pain, Nipple Discharge and Skin Changes. Cardiovascular Present- Leg Cramps and Swelling of Extremities. Not Present- Chest Pain, Difficulty Breathing Lying Down, Palpitations, Rapid Heart Rate and Shortness of Breath. Gastrointestinal Present- Abdominal Pain. Not Present- Bloating, Bloody Stool, Change in Bowel Habits, Chronic diarrhea, Constipation, Difficulty Swallowing, Excessive gas, Gets full quickly at meals, Hemorrhoids, Indigestion, Nausea, Rectal Pain and Vomiting. Female  Genitourinary Present- Urgency. Not Present- Frequency, Nocturia, Painful Urination and Pelvic Pain. Musculoskeletal Present- Swelling of Extremities. Not Present- Back Pain, Joint Pain, Joint Stiffness, Muscle Pain and Muscle Weakness. Neurological Present- Headaches. Not Present- Decreased Memory, Fainting, Numbness, Seizures, Tingling, Tremor, Trouble walking and Weakness. Hematology Present- Easy Bruising. Not  Present- Excessive bleeding, Gland problems, HIV and Persistent Infections.   Vitals (Sonya Bynum CMA; 05/14/2015 8:41 AM) 05/14/2015 8:41 AM Weight: 150 lb Height: 57in Body Surface Area: 1.65 m Body Mass Index: 32.46 kg/m Temp.: 97.71F(Temporal)  Pulse: 77 (Regular)  BP: 128/80 (Sitting, Left Arm, Standard)    Physical Exam Meghan Ok MD; 05/14/2015 9:34 AM) General Mental Status-Alert. General Appearance-Consistent with stated age. Hydration-Well hydrated. Voice-Normal.  Head and Neck Head-normocephalic, atraumatic with no lesions or palpable masses. Trachea-midline. Thyroid Gland Characteristics - normal size and consistency.  Chest and Lung Exam Chest and lung exam reveals -quiet, even and easy respiratory effort with no use of accessory muscles and on auscultation, normal breath sounds, no adventitious sounds and normal vocal resonance. Inspection Chest Wall - Normal. Back - normal.  Cardiovascular Cardiovascular examination reveals -normal heart sounds, regular rate and rhythm with no murmurs and normal pedal pulses bilaterally.  Abdomen Inspection Inspection of the abdomen reveals - No Hernias. Skin - Scar - Note: Laparoscopic cholecystectomy scars. Palpation/Percussion Palpation and Percussion of the abdomen reveal - Soft, Non Tender, No Rebound tenderness, No Rigidity (guarding) and No hepatosplenomegaly. Auscultation Auscultation of the abdomen reveals - Bowel sounds normal.    Assessment & Plan Meghan Ok MD;  05/14/2015 9:36 AM) HIATAL HERNIA (553.3  K44.9) Impression: The patient is a 64 year old female with a symptomatic, moderate-sized hiatal hernia.  1. We will proceed to the OR for robotic hiatal hernia repair and Nissen fundoplication.  2. Discuss with the patient the risks and benefits of the procedure to include but not limited to: Infection, bleeding, damage to structures, possible esophageal leak, possible pneumothorax, possible recurrence of hiatal hernia and slipped Nissen. The patient was understanding and wished to proceed.

## 2015-06-29 NOTE — Op Note (Signed)
06/29/2015  11:12 AM  PATIENT:  Meghan Avila  64 y.o. female  PRE-OPERATIVE DIAGNOSIS:  Hiatal Hernia  POST-OPERATIVE DIAGNOSIS:  Hiatal hernia  PROCEDURE:  Procedure(s): XI ROBOTIC ASSISTED LAPAROSCOPIC HIATAL HERNIA REPAIR WITH MESH AND NISSEN FUNDOPLICATION (N/A)  SURGEON:  Surgeon(s) and Role:    * Ralene Ok, MD - Primary  ASSISTANTS: Michael Boston, MD   ANESTHESIA:   local and general  EBL:25cc Total I/O In: 2000 [I.V.:2000] Out: 300 [Urine:250; Blood:50]  BLOOD ADMINISTERED:none  DRAINS: none   LOCAL MEDICATIONS USED:  BUPIVICAINE   SPECIMEN:  No Specimen  DISPOSITION OF SPECIMEN:  N/A  COUNTS:  YES  TOURNIQUET:  * No tourniquets in log *  DICTATION: .Dragon Dictation She was taken back to the operating room and placed in the lithotomy position with bilateral SCDs in place. The patient was prepped and draped in the usual sterile fashion. After appropriate antibiotics were confirmed a timeout was called and all facts were verified.   A Veress needle technique was used to insufflate the abdomen to 15 mm of mercury the paramedian stab incision. Subsequent to this an 8 mm trocar was introduced as was a 8 millimeter camera. At this time the subsequent robotic trochars x3, were then placed adjacent to this trocar approximately 8-10 cm away. Each trocar was used under direct visualization, there were total of 4 trochars. Assistant trocar was then placed in the right lower quadrant under direct visualization. A 12 mm trocar. The Nathanson retractor was then visualized inserted into the abdomen and the incision just to the left of the falciform ligament. This was then placed to retract the liver appropriately. At this time the patient was positioned in reverse Trendelenburg.   At this time the robot patient cart was brought to the bedside and placed in good position and the arms were docked to the trochars appropriately. At this time I proceeded to incised the  gastrohepatic ligament with the Vessel sealer.  At this time I proceeded to mobilize the stomach inferiorly and visualize the right crus.  There was a large hiatal hernia sac.   The peritoneum over the right crus was incised to the right crus was identified.   Proceed dissect this inferiorly until the left crus was seen joining the right crus. Once the right crus was adequately dissected we turned our to the left crus which was dissected away. This required traction of the stomach to the right side. Once this was visualized we then proceeded to circumferentially dissect the esophagus away from the surrounding tissue. At this time a Penrose drain was placed around the esophagus to help with retraction. At this time the phrenoesophageal fat pad was dissected away from the esophagus. There was no hiatal hernia seen. I mobilized the esophagus cephalad approximately 3-4 cm, clearing away the surrounding tissue. We proceed to dissect the hernia sac away from the stomach with the Vessel Sealer to maintain hemostasis.  We easily removed the anterior portion of the hernia sac and it was removed from the abdominal cavity.    At this time we turned our attention to the greater curvature the stomach and the omentum was mobilized using the robotic vessel sealer. This was taken up to the greater curvature to the hiatus. This mobilized the entire greater curvature to allow mobilization and the wrap. I then proceeded to bring the greater curvature the stomach posterior to the esophagus, and a shoeshine technique was used to evaluate the mobilization of the greater curvature.  At this time I proceeded to close the hiatus using 3 interrupted 2-0 silk stitches in interrupted fashion. This brought together the hiatal closure without undue stricture to the esophagus.   At this time the greater curvature was brought around the esophagus and the shoeshine technique allowed the stomach to lay behind the esophagus with no handling.   We sutured the fundoplication using 2-0 silk sutures interrupted fashion approximately 1 cm apart x3. The middle suture was sutured to the esophagus. A left collar stitch was then used to gastropexy the stomach from the wrap to the diaphragm just lateral to the left crus as.  A second collar stitch was placed from the wrap to the right crus. The wrap lay at approximately 11:00 on its own with undue tension.  The wrap lay loose with no strangulation of the esophagus.  At this time the robot was undocked. The total liver trocar was removed and the fascia was reapproximated using an Endo Close device and an 0 Vicryl in interrupted fashion x1. At this time insufflation was evacuated. Skin was reapproximated for Monocryl subcuticular fashion. The skin was then dressed with Dermabond. The patient tolerated the procedure well and was taken to the recovery room in stable condition.   PLAN OF CARE: Admit to inpatient   PATIENT DISPOSITION:  PACU - hemodynamically stable.   Delay start of Pharmacological VTE agent (>24hrs) due to surgical blood loss or risk of bleeding: not applicable

## 2015-06-30 ENCOUNTER — Inpatient Hospital Stay (HOSPITAL_COMMUNITY): Payer: 59

## 2015-06-30 LAB — CBC
HCT: 31.1 % — ABNORMAL LOW (ref 36.0–46.0)
Hemoglobin: 9.5 g/dL — ABNORMAL LOW (ref 12.0–15.0)
MCH: 23 pg — ABNORMAL LOW (ref 26.0–34.0)
MCHC: 30.5 g/dL (ref 30.0–36.0)
MCV: 75.3 fL — ABNORMAL LOW (ref 78.0–100.0)
Platelets: 226 10*3/uL (ref 150–400)
RBC: 4.13 MIL/uL (ref 3.87–5.11)
RDW: 16.2 % — ABNORMAL HIGH (ref 11.5–15.5)
WBC: 10.4 10*3/uL (ref 4.0–10.5)

## 2015-06-30 LAB — BASIC METABOLIC PANEL
Anion gap: 5 (ref 5–15)
BUN: 11 mg/dL (ref 6–20)
CO2: 29 mmol/L (ref 22–32)
Calcium: 8.1 mg/dL — ABNORMAL LOW (ref 8.9–10.3)
Chloride: 106 mmol/L (ref 101–111)
Creatinine, Ser: 0.46 mg/dL (ref 0.44–1.00)
GFR calc Af Amer: >60 mL/min (ref >60)
GFR calc non Af Amer: >60 mL/min (ref >60)
Glucose, Bld: 148 mg/dL — ABNORMAL HIGH (ref 65–99)
Potassium: 4 mmol/L (ref 3.5–5.1)
Sodium: 140 mmol/L (ref 135–145)

## 2015-06-30 MED ORDER — IOHEXOL 300 MG/ML  SOLN
50.0000 mL | Freq: Once | INTRAMUSCULAR | Status: DC | PRN
  Administered 2015-06-30: 70 mL via ORAL
  Filled 2015-06-30: qty 50

## 2015-06-30 MED ORDER — PHENOL 1.4 % MT LIQD
1.0000 | OROMUCOSAL | Status: DC | PRN
Start: 2015-06-30 — End: 2015-07-02
  Filled 2015-06-30: qty 177

## 2015-06-30 MED ORDER — LIP MEDEX EX OINT
TOPICAL_OINTMENT | CUTANEOUS | Status: AC
  Filled 2015-06-30: qty 7

## 2015-06-30 MED ORDER — MENTHOL 3 MG MT LOZG
1.0000 | LOZENGE | OROMUCOSAL | Status: DC | PRN
  Filled 2015-06-30: qty 9

## 2015-06-30 MED ORDER — ACETAMINOPHEN 160 MG/5ML PO SOLN
650.0000 mg | ORAL | Status: DC | PRN
  Administered 2015-07-01 – 2015-07-02 (×2): 650 mg via ORAL
  Filled 2015-06-30 (×3): qty 20.3

## 2015-06-30 MED ORDER — HYDROCODONE-ACETAMINOPHEN 7.5-325 MG/15ML PO SOLN: 10.0000 mL | ORAL | Status: DC | PRN

## 2015-06-30 NOTE — Progress Notes (Signed)
Patient ID: Meghan Avila, female   DOB: 02/03/1951, 64 y.o.   MRN: [id number]  General Surgery - [facility]  POD#: 1  Subjective: Patient in bed, family at bedside.  Complains of pain.  Complains of trouble swallowing.  Has not been to radiology yet this AM.  Objective: Vital signs in last 24 hours: Temp:  [97.7 F (36.5 C)-98.7 F (37.1 C)] 97.7 F (36.5 C) (09/24 0600) Pulse Rate:  [69-100] 74 (09/24 0600) Resp:  [14-20] 16 (09/24 0600) BP: (105-122)/(48-91) 114/59 mmHg (09/24 0600) SpO2:  [95 %-100 %] 98 % (09/24 0600) Last BM Date: 06/28/15  Intake/Output from previous day: 09/23 0701 - 09/24 0700 In: 4258.3 [I.V.:4258.3] Out: 2550 [Urine:2500; Blood:50] Intake/Output this shift: Total I/O In: -  Out: 250 [Urine:250]  Physical Exam: HEENT - sclerae clear, mucous membranes moist Neck - soft Chest - clear bilaterally Cor - RRR Abdomen - soft, obese, wounds dry and intact; few BS present Ext - no edema, non-tender Neuro - alert & oriented, no focal deficits  Lab Results:   Recent Labs  06/30/15 0448  WBC 10.4  HGB 9.5*  HCT 31.1*  PLT 226   BMET  Recent Labs  06/30/15 0448  NA 140  K 4.0  CL 106  CO2 29  GLUCOSE 148*  BUN 11  CREATININE 0.46  CALCIUM 8.1*   PT/INR No results for input(s): LABPROT, INR in the last 72 hours. Comprehensive Metabolic Panel:    Component Value Date/Time   NA 140 06/30/2015 0448   NA 140 06/21/2015 1430   K 4.0 06/30/2015 0448   K 3.9 06/21/2015 1430   CL 106 06/30/2015 0448   CL 106 06/21/2015 1430   CO2 29 06/30/2015 0448   CO2 28 06/21/2015 1430   BUN 11 06/30/2015 0448   BUN 13 06/21/2015 1430   CREATININE 0.46 06/30/2015 0448   CREATININE 0.65 06/21/2015 1430   GLUCOSE 148* 06/30/2015 0448   GLUCOSE 96 06/21/2015 1430   CALCIUM 8.1* 06/30/2015 0448   CALCIUM 9.0 06/21/2015 1430   AST 18 03/13/2015 1612   AST 23 02/05/2015 1806   ALT 22 03/13/2015 1612   ALT 23 02/05/2015 1806    ALKPHOS 114 03/13/2015 1612   ALKPHOS 95 02/05/2015 1806   BILITOT 0.2 03/13/2015 1612   BILITOT 0.6 02/05/2015 1806   PROT 6.6 03/13/2015 1612   PROT 6.3* 02/05/2015 1806   ALBUMIN 4.1 03/13/2015 1612   ALBUMIN 3.6 02/05/2015 1806    Studies/Results: Dg Chest Port 1 View  06/29/2015   CLINICAL DATA:  Postop from a Nissen fundoplication.  EXAM: PORTABLE CHEST 1 VIEW  COMPARISON:  03/13/2015  FINDINGS: There is patchy perihilar airspace opacity extending to the medial lung bases, left greater than right. Left hemidiaphragm is obscured. There is central vascular congestion accentuated by low lung volumes.  Heart is normal in size.  There is a tiny left apical pneumothorax.  No right pneumothorax.  Bony thorax is intact.  IMPRESSION: 1. There is perihilar and lung base opacity, left greater than right, which may all reflect atelectasis. A component of central pulmonary edema is possible. There is a probable small left pleural effusion. 2. Tiny left apical pneumothorax.   Electronically Signed   By: David  Ormond M.D.   On: 06/29/2015 12:48   Us Breast Ltd Uni Left Inc Axilla  06/28/2015   CLINICAL DATA:  Possible asymmetry left axillary tail/lower left axilla identified on the MLO view of the recent   screening mammogram. When I met with the patient today, she tells me that she underwent surgery in May of 2016 for a focal skin infection or abscess. She shows me the healed surgical scar in the lower left axilla.  EXAM: DIGITAL DIAGNOSTIC LEFT MAMMOGRAM WITH 3D TOMOSYNTHESIS WITH CAD  ULTRASOUND LEFT BREAST  COMPARISON:  06/21/2015 and earlier priors  ACR Breast Density Category b: There are scattered areas of fibroglandular density.  FINDINGS: There are persistent wispy densities in the lower left axilla on focal spot compression imaging and 3D tomographic imaging of left breast in the 90 degree lateral and exaggerated CC lateral views. There is no discrete mass or distortion.  Mammographic images were  processed with CAD.  On physical exam, there is an approximately 6-7 mm healed cutaneous scar in the lower left axilla/axillary tail of the left breast the correlates with the patient's surgical site in May of 2016, 12 for. No mass or thickening is felt.  Targeted ultrasound is performed, showing an area of slightly increased echogenicity within the subcutaneous fat in the region of the cutaneous scar, consistent with postsurgical change. No fluid collection mass or abnormal shadowing is seen. Normal left axillary lymph nodes are incidentally imaged.  IMPRESSION: The new asymmetry in the axillary tail/lower axilla on the left seen on the recent screening mammogram corresponds with a surgical site, with visible healed cutaneous scar. The surgery occurred in Feb 11, 2015. No evidence of malignancy.  RECOMMENDATION: Screening mammogram in one year.(Code:[id number]-B-01Y)  I have discussed the findings and recommendations with the patient. Results were also provided in writing at the conclusion of the visit. If applicable, a reminder letter will be sent to the patient regarding the next appointment.  BI-RADS CATEGORY  1: Negative.   Electronically Signed   By: Susan  Turner M.D.   On: 06/28/2015 11:28    Anti-infectives: Anti-infectives    Start     Dose/Rate Route Frequency Ordered Stop   06/29/15 0601  ceFAZolin (ANCEF) IVPB 2 g/50 mL premix     2 g 100 mL/hr over 30 Minutes Intravenous On call to O.R. 06/29/15 0601 06/29/15 0745      Assessment & Plans: Status post Nissen fundoplication  Await gastrograffin swallow study this AM  If study OK, will start clear liquid diet  Encouraged OOB, ambulation, IS use  Todd M. Gerkin, MD, FACS [facility] Office: [phone]   GERKIN,TODD M 06/30/2015    

## 2015-07-01 MED ORDER — FUROSEMIDE 40 MG PO TABS
40.0000 mg | ORAL_TABLET | Freq: Every day | ORAL | Status: DC
  Filled 2015-07-01 (×2): qty 1

## 2015-07-01 MED ORDER — MENTHOL 3 MG MT LOZG: 1.0000 | LOZENGE | OROMUCOSAL | Status: DC | PRN

## 2015-07-01 MED ORDER — LACTATED RINGERS IV BOLUS (SEPSIS): 1000.0000 mL | Freq: Three times a day (TID) | INTRAVENOUS | Status: DC | PRN

## 2015-07-01 MED ORDER — ASPIRIN-ACETAMINOPHEN-CAFFEINE 250-250-65 MG PO TABS
1.0000 | ORAL_TABLET | Freq: Four times a day (QID) | ORAL | Status: DC | PRN
  Filled 2015-07-01: qty 1

## 2015-07-01 MED ORDER — SODIUM CHLORIDE 0.9 % IJ SOLN: 3.0000 mL | Freq: Two times a day (BID) | INTRAMUSCULAR | Status: DC

## 2015-07-01 MED ORDER — SODIUM CHLORIDE 0.9 % IJ SOLN: 3.0000 mL | INTRAMUSCULAR | Status: DC | PRN

## 2015-07-01 MED ORDER — ALUM & MAG HYDROXIDE-SIMETH 200-200-20 MG/5ML PO SUSP: 30.0000 mL | Freq: Four times a day (QID) | ORAL | Status: DC | PRN

## 2015-07-01 MED ORDER — SODIUM CHLORIDE 0.9 % IV SOLN: 250.0000 mL | INTRAVENOUS | Status: DC | PRN

## 2015-07-01 MED ORDER — SODIUM CHLORIDE 0.9 % IJ SOLN
3.0000 mL | Freq: Two times a day (BID) | INTRAMUSCULAR | Status: DC
  Administered 2015-07-01: 3 mL via INTRAVENOUS

## 2015-07-01 MED ORDER — VITAMIN D (ERGOCALCIFEROL) 1.25 MG (50000 UNIT) PO CAPS
50000.0000 [IU] | ORAL_CAPSULE | ORAL | Status: DC
  Administered 2015-07-01: 50000 [IU] via ORAL
  Filled 2015-07-01: qty 1

## 2015-07-01 MED ORDER — LIP MEDEX EX OINT
1.0000 "application " | TOPICAL_OINTMENT | Freq: Two times a day (BID) | CUTANEOUS | Status: DC
  Administered 2015-07-02: 1 via TOPICAL

## 2015-07-01 MED ORDER — PANTOPRAZOLE SODIUM 40 MG PO TBEC
40.0000 mg | DELAYED_RELEASE_TABLET | Freq: Two times a day (BID) | ORAL | Status: DC
  Administered 2015-07-01 – 2015-07-02 (×3): 40 mg via ORAL
  Filled 2015-07-01 (×4): qty 1

## 2015-07-01 MED ORDER — ADULT MULTIVITAMIN W/MINERALS CH
1.0000 | ORAL_TABLET | Freq: Every day | ORAL | Status: DC
  Administered 2015-07-01 – 2015-07-02 (×2): 1 via ORAL
  Filled 2015-07-01 (×2): qty 1

## 2015-07-01 MED ORDER — ENSURE ENLIVE PO LIQD
237.0000 mL | Freq: Two times a day (BID) | ORAL | Status: DC
  Administered 2015-07-02: 237 mL via ORAL

## 2015-07-01 MED ORDER — IBUPROFEN 200 MG PO TABS: 200.0000 mg | ORAL_TABLET | Freq: Four times a day (QID) | ORAL | Status: DC | PRN

## 2015-07-01 MED ORDER — MAGIC MOUTHWASH
15.0000 mL | Freq: Four times a day (QID) | ORAL | Status: DC | PRN
  Filled 2015-07-01: qty 15

## 2015-07-01 MED ORDER — PHENOL 1.4 % MT LIQD: 2.0000 | OROMUCOSAL | Status: DC | PRN

## 2015-07-01 NOTE — Plan of Care (Signed)
Problem: Food- and Nutrition-Related Knowledge Deficit (NB-1.1) Goal: Nutrition education Formal process to instruct or train a patient/client in a skill or to impart knowledge to help patients/clients voluntarily manage or modify food choices and eating behavior to maintain or improve health. Outcome: Completed/Met Date Met:  07/01/15 Nutrition Education Note  Received consult for diet education. Pt is s/p nissen fundoplication. Pt will follow a liquid diet x 2 weeks. Will order Ensure Enlive for patient BID.  Discussed 2 week post op diet with pt. Provided "Nissen Fundoplication diet" handout. Emphasized that liquids must be non carbonated, non caffeinated, and sugar free. Fluid goals discussed. Teach back method used, pt expressed understanding, expect good compliance.  Body mass index is 29.3 kg/(m^2). Pt meets criteria for overweight based on current BMI.  Current diet order is full liquids. Labs and medications reviewed. No further nutrition interventions warranted at this time. If additional nutrition issues arise, please re-consult RD.  Clayton Bibles, MS, RD, LDN Pager: [phone] After Hours Pager: [phone]

## 2015-07-01 NOTE — Discharge Instructions (Signed)
EATING AFTER YOUR ESOPHAGEAL SURGERY (Stomach Fundoplication, Hiatal Hernia repair, Achalasia surgery, etc)  After your esophageal surgery, expect some sticking with swallowing over the next 1-2 months.    If food sticks when you eat, it is called "dysphagia".  This is due to swelling around your esophagus at the wrap & hiatal diaphragm repair.  It will gradually ease off over the next few months.  To help you through this temporary phase, we start you out on a pureed (blenderized) diet.  Your first meal in the hospital was thin liquids.  You should have been given a pureed diet by the time you left the hospital.  We ask patients to stay on a pureed diet for the first 2-3 weeks to avoid anything getting "stuck" near your recent surgery.  Don't be alarmed if your ability to swallow doesn't progress according to this plan.  Everyone is different and some diets can advance more or less quickly.     Some BASIC RULES to follow are:  Maintain an upright position whenever eating or drinking.  Take small bites - just a teaspoon size bite at a time.  Eat slowly.  It may also help to eat only one food at a time.  Consider nibbling through smaller, more frequent meals & avoid the urge to eat BIG meals  Do not push through feelings of fullness, nausea, or bloatedness  Do not mix solid foods and liquids in the same mouthful  Try not to "wash foods down" with large gulps of liquids.  Avoid carbonated (bubbly/fizzy) drinks.    Avoid foods that make you feel gassy or bloated.  Start with bland foods first.  Wait on trying greasy, fried, or spicy meals until you are tolerating more bland solids well.  Understand that it will be hard to burp and belch at first.  This gradually improves with time.  Expect to be more gassy/flatulent/bloated initially.  Walking will help your body manage it better.  Consider using medications for bloating that contain simethicone such as  Maalox or Gas-X   Eat in a  relaxed atmosphere & minimize distractions.  Avoid talking while eating.    Do not use straws.  Following each meal, sit in an upright position (90 degree angle) for 60 to 90 minutes.  Going for a short walk can help as well  If food does stick, don't panic.  Try to relax and let the food pass on its own.  Sipping WARM LIQUID such as strong hot black tea can also help slide it down.   Be gradual in changes & use common sense:  -If you easily tolerating a certain "level" of foods, advance to the next level gradually -If you are having trouble swallowing a particular food, then avoid it.   -If food is sticking when you advance your diet, go back to thinner previous diet (the lower LEVEL) for 1-2 days.  LEVEL 1 = PUREED DIET  Do for the first 2 WEEKS AFTER SURGERY  -Foods in this group are pureed or blenderized to a smooth, mashed potato-like consistency.  -If necessary, the pureed foods can keep their shape with the addition of a thickening agent.   -Meat should be pureed to a smooth, pasty consistency.  Hot broth or gravy may be added to the pureed meat, approximately 1 oz. of liquid per 3 oz. serving of meat. -CAUTION:  If any foods do not puree into a smooth consistency, swallowing will be more difficult.  (For example, nuts  or seeds sometimes do not blend well.)  Hot Foods Cold Foods  Pureed scrambled eggs and cheese Pureed cottage cheese  Baby cereals Thickened juices and nectars  Thinned cooked cereals (no lumps) Thickened milk or eggnog  Pureed [place] toast or pancakes Ensure  Mashed potatoes Ice cream  Pureed parsley, au gratin, scalloped potatoes, candied sweet potatoes Fruit or [place] ice, sherbet  Pureed buttered or alfredo noodles Plain yogurt  Pureed vegetables (no corn or peas) Instant breakfast  Pureed soups and creamed soups Smooth pudding, mousse, custard  Pureed scalloped apples Whipped gelatin  Gravies Sugar, syrup, honey, jelly  Sauces, cheese, tomato,  barbecue, white, creamed Cream  Any baby food Creamer  Alcohol in moderation (not beer or champagne) Margarine  Coffee or tea Mayonnaise   Ketchup, mustard   Apple sauce   SAMPLE MENU:  PUREED DIET Breakfast Lunch Dinner   Orange juice, 1/2 cup  Cream of wheat, 1/2 cup  Pineapple juice, 1/2 cup  Pureed [place], barley soup, 3/4 cup  Pureed Hawaiian chicken, 3 oz   Scrambled eggs, mashed or blended with cheese, 1/2 cup  Tea or coffee, 1 cup   Whole milk, 1 cup   Non-dairy creamer, 2 Tbsp.  Mashed potatoes, 1/2 cup  Pureed cooled broccoli, 1/2 cup  Apple sauce, 1/2 cup  Coffee or tea  Mashed potatoes, 1/2 cup  Pureed spinach, 1/2 cup  Frozen yogurt, 1/2 cup  Tea or coffee      LEVEL 2 = SOFT DIET  After your first 2 weeks, you can advance to a soft diet.   Keep on this diet until everything goes down easily.  Hot Foods Cold Foods  White fish Cottage cheese  Stuffed fish Junior baby fruit  Baby food meals Semi thickened juices  Minced soft cooked, scrambled, poached eggs nectars  Souffle & omelets Ripe mashed bananas  Cooked cereals Canned fruit, pineapple sauce, milk  potatoes Milkshake  Buttered or Alfredo noodles Custard  Cooked cooled vegetable Puddings, including tapioca  Sherbet Yogurt  Vegetable soup or alphabet soup Fruit ice, [place] ice  Gravies Whipped gelatin  Sugar, syrup, honey, jelly Junior baby desserts  Sauces:  Cheese, creamed, barbecue, tomato, white Cream  Coffee or tea Margarine   SAMPLE MENU:  LEVEL 2 Breakfast Lunch Dinner   Orange juice, 1/2 cup  Oatmeal, 1/2 cup  Scrambled eggs with cheese, 1/2 cup  Decaffeinated tea, 1 cup  Whole milk, 1 cup  Non-dairy creamer, 2 Tbsp  Pineapple juice, 1/2 cup  Minced beef, 3 oz  Gravy, 2 Tbsp  Mashed potatoes, 1/2 cup  Minced fresh broccoli, 1/2 cup  Applesauce, 1/2 cup  Coffee, 1 cup  [place], barley soup, 3/4 cup  Minced Hawaiian chicken, 3 oz  Mashed potatoes, 1/2  cup  Cooked spinach, 1/2 cup  Frozen yogurt, 1/2 cup  Non-dairy creamer, 2 Tbsp      LEVEL 3 = CHOPPED DIET  -After all the foods in level 2 (soft diet) are passing through well you should advance up to more chopped foods.  -It is still important to cut these foods into small pieces and eat slowly.  Hot Foods Cold Foods  Poultry Cottage cheese  Chopped Swedish meatballs Yogurt  Meat salads (ground or flaked meat) Milk  Flaked fish (tuna) Milkshakes  Poached or scrambled eggs Soft, cold, dry cereal  Souffles and omelets Fruit juices or nectars  Cooked cereals Chopped canned fruit  Chopped [place] toast or pancakes Canned fruit cocktail  Noodles or  pasta (no rice) Pudding, mousse, custard  Cooked vegetables (no frozen peas, corn, or mixed vegetables) Green salad  Canned small sweet peas Ice cream  Creamed soup or vegetable soup Fruit ice, [place] ice  Pureed vegetable soup or alphabet soup Non-dairy creamer  Ground scalloped apples Margarine  Gravies Mayonnaise  Sauces:  Cheese, creamed, barbecue, tomato, white Ketchup  Coffee or tea Mustard   SAMPLE MENU:  LEVEL 3 Breakfast Lunch Dinner   Orange juice, 1/2 cup  Oatmeal, 1/2 cup  Scrambled eggs with cheese, 1/2 cup  Decaffeinated tea, 1 cup  Whole milk, 1 cup  Non-dairy creamer, 2 Tbsp  Ketchup, 1 Tbsp  Margarine, 1 tsp  Salt, 1/4 tsp  Sugar, 2 tsp  Pineapple juice, 1/2 cup  Ground beef, 3 oz  Gravy, 2 Tbsp  Mashed potatoes, 1/2 cup  Cooked spinach, 1/2 cup  Applesauce, 1/2 cup  Decaffeinated coffee  Whole milk  Non-dairy creamer, 2 Tbsp  Margarine, 1 tsp  Salt, 1/4 tsp  Pureed [place], barley soup, 3/4 cup  Barbecue chicken, 3 oz  Mashed potatoes, 1/2 cup  Ground fresh broccoli, 1/2 cup  Frozen yogurt, 1/2 cup  Decaffeinated tea, 1 cup  Non-dairy creamer, 2 Tbsp  Margarine, 1 tsp  Salt, 1/4 tsp  Sugar, 1 tsp    LEVEL 4:  REGULAR FOODS  -Foods in this group are soft,  moist, regularly textured foods.   -This level includes meat and breads, which tend to be the hardest things to swallow.   -Eat very slowly, chew well and continue to avoid carbonated drinks. -most people are at this level in 4-6 weeks  Hot Foods Cold Foods  Baked fish or skinned Soft cheeses - cottage cheese  Souffles and omelets Cream cheese  Eggs Yogurt  Stuffed shells Milk  Spaghetti with meat sauce Milkshakes  Cooked cereal Cold dry cereals (no nuts, dried fruit, coconut)  [place] toast or pancakes Crackers  Buttered toast Fruit juices or nectars  Noodles or pasta (no rice) Canned fruit  Potatoes (all types) Ripe bananas  Soft, cooked vegetables (no corn, lima, or baked beans) Peeled, ripe, fresh fruit  Creamed soups or vegetable soup Cakes (no nuts, dried fruit, coconut)  Canned chicken noodle soup Plain doughnuts  Gravies Ice cream  Bacon dressing Pudding, mousse, custard  Sauces:  Cheese, creamed, barbecue, tomato, white Fruit ice, [place] ice, sherbet  Decaffeinated tea or coffee Whipped gelatin  Pork chops Regular gelatin   Canned fruited gelatin molds   Sugar, syrup, honey, jam, jelly   Cream   Non-dairy   Margarine   Oil   Mayonnaise   Ketchup   Mustard   TROUBLESHOOTING IRREGULAR BOWELS  1) Avoid extremes of bowel movements (no bad constipation/diarrhea)  2) Miralax 17gm mixed in 8oz. water or juice-daily. May use BID as needed.  3) Gas-x,Phazyme, etc. as needed for gas & bloating.  4) Soft,bland diet. No spicy,greasy,fried foods.  5) Prilosec over-the-counter as needed  6) May hold gluten/wheat products from diet to see if symptoms improve.  7) May try probiotics (Align, Activa, etc) to help calm the bowels down  7) If symptoms become worse call back immediately.    If you have any questions please call our office at [place]: [phone].  LAPAROSCOPIC SURGERY: POST OP INSTRUCTIONS  1. DIET: Follow a light bland diet the first 24 hours  after arrival home, such as soup, liquids, crackers, etc.  Be sure to include lots of fluids daily.  Avoid fast  food or heavy meals as your are more likely to get nauseated.  Eat a low fat the next few days after surgery.   2. Take your usually prescribed home medications unless otherwise directed. 3. PAIN CONTROL: a. Pain is best controlled by a usual combination of three different methods TOGETHER: i. Ice/Heat ii. Over the counter pain medication iii. Prescription pain medication b. Most patients will experience some swelling and bruising around the incisions.  Ice packs or heating pads (30-60 minutes up to 6 times a day) will help. Use ice for the first few days to help decrease swelling and bruising, then switch to heat to help relax tight/sore spots and speed recovery.  Some people prefer to use ice alone, heat alone, alternating between ice & heat.  Experiment to what works for you.  Swelling and bruising can take several weeks to resolve.   c. It is helpful to take an over-the-counter pain medication regularly for the first few weeks.  Choose one of the following that works best for you: i. Naproxen (Aleve, etc)  Two 220mg  tabs twice a day ii. Ibuprofen (Advil, etc) Three 200mg  tabs four times a day (every meal & bedtime) iii. Acetaminophen (Tylenol, etc) 500-650mg  four times a day (every meal & bedtime) d. A  prescription for pain medication (such as oxycodone, hydrocodone, etc) should be given to you upon discharge.  Take your pain medication as prescribed.  i. If you are having problems/concerns with the prescription medicine (does not control pain, nausea, vomiting, rash, itching, etc), please call us [phone] to see if we need to switch you to a different pain medicine that will work better for you and/or control your side effect better. ii. If you need a refill on your pain medication, please contact your pharmacy.  They will contact our office to request authorization. Prescriptions  will not be filled after 5 pm or on week-ends. 4. Avoid getting constipated.  Between the surgery and the pain medications, it is common to experience some constipation.  Increasing fluid intake and taking a fiber supplement (such as Metamucil, Citrucel, FiberCon, MiraLax, etc) 1-2 times a day regularly will usually help prevent this problem from occurring.  A mild laxative (prune juice, Milk of Magnesia, MiraLax, etc) should be taken according to package directions if there are no bowel movements after 48 hours.   5. Watch out for diarrhea.  If you have many loose bowel movements, simplify your diet to bland foods & liquids for a few days.  Stop any stool softeners and decrease your fiber supplement.  Switching to mild anti-diarrheal medications (Kayopectate, Pepto Bismol) can help.  If this worsens or does not improve, please call us. 6. Wash / shower every day.  You may shower over the dressings as they are waterproof.  Continue to shower over incision(s) after the dressing is off. 7. Remove your waterproof bandages 5 days after surgery.  You may leave the incision open to air.  You may replace a dressing/Band-Aid to cover the incision for comfort if you wish.  8. ACTIVITIES as tolerated:   a. You may resume regular (light) daily activities beginning the next day--such as daily self-care, walking, climbing stairs--gradually increasing activities as tolerated.  If you can walk 30 minutes without difficulty, it is safe to try more intense activity such as jogging, treadmill, bicycling, low-impact aerobics, swimming, etc. b. Save the most intensive and strenuous activity for last such as sit-ups, heavy lifting, contact sports, etc  Refrain from any  heavy lifting or straining until you are off narcotics for pain control.   c. DO NOT PUSH THROUGH PAIN.  Let pain be your guide: If it hurts to do something, don't do it.  Pain is your body warning you to avoid that activity for another week until the pain goes  down. d. You may drive when you are no longer taking prescription pain medication, you can comfortably wear a seatbelt, and you can safely maneuver your car and apply brakes. e. Dennis Bast may have sexual intercourse when it is comfortable.  9. FOLLOW UP in our office a. Please call [facility] at [phone] to set up an appointment to see your surgeon in the office for a follow-up appointment approximately 2-3 weeks after your surgery. b. Make sure that you call for this appointment the day you arrive home to insure a convenient appointment time. 10. IF YOU HAVE DISABILITY OR FAMILY LEAVE FORMS, BRING THEM TO THE OFFICE FOR PROCESSING.  DO NOT GIVE THEM TO YOUR DOCTOR.   WHEN TO CALL us [phone]: 1. Poor pain control 2. Reactions / problems with new medications (rash/itching, nausea, etc)  3. Fever over 101.5 F (38.5 C) 4. Inability to urinate 5. Nausea and/or vomiting 6. Worsening swelling or bruising 7. Continued bleeding from incision. 8. Increased pain, redness, or drainage from the incision   The clinic staff is available to answer your questions during regular business hours (8:30am-5pm).  Please dont hesitate to call and ask to speak to one of our nurses for clinical concerns.   If you have a medical emergency, go to the nearest emergency room or call 911.  A surgeon from [facility] is always on call at the [facility], [place], [place], [place], [place]  [address] ? MAIN: [phone] ? TOLL FREE: [phone] ?  FAX [phone] [email or web address]  GETTING TO GOOD BOWEL HEALTH. Irregular bowel habits such as constipation and diarrhea can lead to many problems over time.  Having one soft bowel movement a day is the most important way to prevent further problems.  The anorectal canal is designed to handle stretching and feces to safely manage our ability to get rid of solid waste (feces, poop, stool) out of  our body.  BUT, hard constipated stools can act like ripping concrete bricks and diarrhea can be a burning fire to this very sensitive area of our body, causing inflamed hemorrhoids, anal fissures, increasing risk is perirectal abscesses, abdominal pain/bloating, an making irritable bowel worse.      The goal: ONE SOFT BOWEL MOVEMENT A DAY!  To have soft, regular bowel movements:   Drink plenty of fluids, consider 4-6 tall glasses of water a day.    Take plenty of fiber.  Fiber is the undigested part of plant food that passes into the colon, acting s natures broom to encourage bowel motility and movement.  Fiber can absorb and hold large amounts of water. This results in a larger, bulkier stool, which is soft and easier to pass. Work gradually over several weeks up to 6 servings a day of fiber (25g a day even more if needed) in the form of: o Vegetables -- Root (potatoes, carrots, turnips), leafy green (lettuce, salad greens, celery, spinach), or cooked high residue (cabbage, broccoli, etc) o Fruit -- Fresh (unpeeled skin & pulp), Dried (prunes, apricots, cherries, etc ),  or stewed ( applesauce)  o Whole grain breads, pasta, etc (whole wheat)  o Bran cereals   Bulking Agents -- This type of water-retaining fiber generally is easily obtained each day by one of the following:  o Psyllium bran -- The psyllium plant is remarkable because its ground seeds can retain so much water. This product is available as Metamucil, Konsyl, Effersyllium, Per Diem Fiber, or the less expensive generic preparation in drug and health food stores. Although labeled a laxative, it really is not a laxative.  o Methylcellulose -- This is another fiber derived from wood which also retains water. It is available as Citrucel. o Polyethylene Glycol - and artificial fiber commonly called Miralax or Glycolax.  It is helpful for people with gassy or bloated feelings with regular fiber o Flax Seed - a less gassy fiber than  psyllium  No reading or other relaxing activity while on the toilet. If bowel movements take longer than 5 minutes, you are too constipated  AVOID CONSTIPATION.  High fiber and water intake usually takes care of this.  Sometimes a laxative is needed to stimulate more frequent bowel movements, but   Laxatives are not a good long-term solution as it can wear the colon out.  They can help jump-start bowels if constipated, but should be relied on constantly without discussing with your doctor o Osmotics (Milk of Magnesia, Fleets phosphosoda, Magnesium citrate, MiraLax, GoLytely) are safer than  o Stimulants (Senokot, Castor Oil, Dulcolax, Ex Lax)    o Avoid taking laxatives for more than 7 days in a row.   IF SEVERELY CONSTIPATED, try a Bowel Retraining Program: o Do not use laxatives.  o Eat a diet high in roughage, such as bran cereals and leafy vegetables.  o Drink six (6) ounces of prune or apricot juice each morning.  o Eat two (2) large servings of stewed fruit each day.  o Take one (1) heaping tablespoon of a psyllium-based bulking agent twice a day. Use sugar-free sweetener when possible to avoid excessive calories.  o Eat a normal breakfast.  o Set aside 15 minutes after breakfast to sit on the toilet, but do not strain to have a bowel movement.  o If you do not have a bowel movement by the third day, use an enema and repeat the above steps.   Controlling diarrhea o Switch to liquids and simpler foods for a few days to avoid stressing your intestines further. o Avoid dairy products (especially milk & ice cream) for a short time.  The intestines often can lose the ability to digest lactose when stressed. o Avoid foods that cause gassiness or bloating.  Typical foods include beans and other legumes, cabbage, broccoli, and dairy foods.  Every person has some sensitivity to other foods, so listen to our body and avoid those foods that trigger problems for you. o Adding fiber (Citrucel,  Metamucil, psyllium, Miralax) gradually can help thicken stools by absorbing excess fluid and retrain the intestines to act more normally.  Slowly increase the dose over a few weeks.  Too much fiber too soon can backfire and cause cramping & bloating. o Probiotics (such as active yogurt, Align, etc) may help repopulate the intestines and colon with normal bacteria and calm down a sensitive digestive tract.  Most studies show it to be of mild help, though, and such products can be costly. o Medicines: - Bismuth subsalicylate (ex. Kayopectate, Pepto Bismol) every 30 minutes for up to 6 doses can help control diarrhea.  Avoid if pregnant. - Loperamide (Immodium) can slow down diarrhea.  Start with  two tablets (4mg  total) first and then try one tablet every 6 hours.  Avoid if you are having fevers or severe pain.  If you are not better or start feeling worse, stop all medicines and call your doctor for advice o Call your doctor if you are getting worse or not better.  Sometimes further testing (cultures, endoscopy, X-ray studies, bloodwork, etc) may be needed to help diagnose and treat the cause of the diarrhea.  TROUBLESHOOTING IRREGULAR BOWELS 1) Avoid extremes of bowel movements (no bad constipation/diarrhea) 2) Miralax 17gm mixed in 8oz. water or juice-daily. May use BID as needed.  3) Gas-x,Phazyme, etc. as needed for gas & bloating.  4) Soft,bland diet. No spicy,greasy,fried foods.  5) Prilosec over-the-counter as needed  6) May hold gluten/wheat products from diet to see if symptoms improve.  7)  May try probiotics (Align, Activa, etc) to help calm the bowels down 7) If symptoms become worse call back immediately.  Managing Pain  Pain after surgery or related to activity is often due to strain/injury to muscle, tendon, nerves and/or incisions.  This pain is usually short-term and will improve in a few months.   Many people find it helpful to do the following things TOGETHER to help speed the  process of healing and to get back to regular activity more quickly:  1. Avoid heavy physical activity at first a. No lifting greater than 20 pounds at first, then increase to lifting as tolerated over the next few weeks b. Do not push through the pain.  Listen to your body and avoid positions and maneuvers than reproduce the pain.  Wait a few days before trying something more intense c. Walking is okay as tolerated, but go slowly and stop when getting sore.  If you can walk 30 minutes without stopping or pain, you can try more intense activity (running, jogging, aerobics, cycling, swimming, treadmill, sex, sports, weightlifting, etc ) d. Remember: If it hurts to do it, then dont do it!  2. Take Anti-inflammatory medication a. Choose ONE of the following over-the-counter medications: i.            Acetaminophen 500mg  tabs (Tylenol) 1-2 pills with every meal and just before bedtime (avoid if you have liver problems) ii.            Naproxen 220mg  tabs (ex. Aleve) 1-2 pills twice a day (avoid if you have kidney, stomach, IBD, or bleeding problems) iii. Ibuprofen 200mg  tabs (ex. Advil, Motrin) 3-4 pills with every meal and just before bedtime (avoid if you have kidney, stomach, IBD, or bleeding problems) b. Take with food/snack around the clock for 1-2 weeks i. This helps the muscle and nerve tissues become less irritable and calm down faster  3. Use a Heating pad or Ice/Cold Pack a. 4-6 times a day b. May use warm bath/hottub  or showers  4. Try Gentle Massage and/or Stretching  a. at the area of pain many times a day b. stop if you feel pain - do not overdo it  Try these steps together to help you body heal faster and avoid making things get worse.  Doing just one of these things may not be enough.    If you are not getting better after two weeks or are noticing you are getting worse, contact our office for further advice; we may need to re-evaluate you & see what other things we can do to  help.  Nissen Fundoplication Care After Please read the instructions outlined below  and refer to this sheet for the next few weeks. These discharge instructions provide you with general information on caring for yourself after you leave the hospital. Your doctor may also give you specific instructions. While your treatment has been planned according to the most current medical practices available, unavoidable complications sometimes happen. If you have any problems or questions after discharge, please call your doctor. ACTIVITY  Take frequent rest periods throughout the day.  Take frequent walks throughout the day. This will help to prevent blood clots.  Continue to do your coughing and deep breathing exercises once you get home. This will help to prevent pneumonia.  No strenuous activities such as heavy lifting, pushing or pulling until after your follow-up visit with your doctor. Do not lift anything heavier than 10 pounds.  Talk with your caregiver about when you may return to work and your exercise routine.  You may shower 2 days after surgery. Pat incisions dry. Do not rub incisions with washcloth or towel.  Do not drive while taking prescription pain medication. NUTRITION  Continue with a liquid diet, or the diet you were directed to take, until your first follow-up visit with your surgeon.  Drink fluids (6-8 glasses a day).  Call your caregiver for persistent nausea (feeling sick to your stomach), vomiting, bloating or difficulty swallowing. ELIMINATION It is very important not to strain during bowel movements. If constipation should occur, you may:  Take a mild laxative (such as Milk of Magnesia).  Add fruit and bran to your diet.  Drink more fluids.  Call your caregiver if constipation is not relieved. FEVER If you feel feverish or have shaking chills, take your temperature. If it is 70 F (38.9 C) or above, call your caregiver. The fever may mean there is an  infection. PAIN CONTROL  If a prescription was given for a pain reliever, please follow your caregiver's directions.  Only take over-the-counter or prescription medicines for pain, discomfort, or fever as directed by your caregiver.  If the pain is not relieved by your medicine, becomes worse, or you have difficulty breathing, call your doctor. INCISION  It is normal for your cuts (incisions) from surgery to have a small amount of drainage for the first 1-2 days. Once the drainage has stopped, leave your incision(s) open to air.  Check your incision(s) and surrounding area daily for any redness, swelling, increased drainage or bleeding. If any of these are present or if the wound edges start to separate, call your doctor.  If you have small adhesive strips in place, they will peel and fall off. (If these strips are covered with a clear bandage, your doctor will tell you when to remove them.)  If you have staples, your caregiver will remove them at the follow-up appointment. Document Released: 05/15/2004 Document Revised: 12/15/2011 Document Reviewed: 08/19/2007 [facility] Patient Information 2015 [place], [place]. This information is not intended to replace advice given to you by your health care provider. Make sure you discuss any questions you have with your health care provider.

## 2015-07-01 NOTE — Progress Notes (Signed)
Patient ID: Meghan Avila, female   DOB: [birth year], 64 y.o.   MRN: [id number]  [facility]  POD#: 2  Subjective: Patient in bed, family at bedside.  Complains of left shoulder pain.  Doesn't like clear liquid diet much, no nausea, passing flatus.  Objective: Vital signs in last 24 hours: Temp:  [98.3 F (36.8 C)-99 F (37.2 C)] 99 F (37.2 C) (09/25 0555) Pulse Rate:  [81-89] 81 (09/25 0555) Resp:  [16-18] 16 (09/25 0555) BP: (118-131)/(59-62) 131/62 mmHg (09/25 0555) SpO2:  [94 %-97 %] 97 % (09/25 0555) Last BM Date: 06/28/15  Intake/Output from previous day: 09/24 0701 - 09/25 0700 In: 2760 [P.O.:360; I.V.:2400] Out: 250 [Urine:250] Intake/Output this shift:    Physical Exam: HEENT - sclerae clear, mucous membranes moist Neck - soft Chest - clear bilaterally Cor - RRR Abdomen - wounds dry and intact, mild tenderness; BS present Ext - no edema, non-tender Neuro - alert & oriented, no focal deficits  Lab Results:   Recent Labs  06/30/15 0448  WBC 10.4  HGB 9.5*  HCT 31.1*  PLT 226   BMET  Recent Labs  06/30/15 0448  NA 140  K 4.0  CL 106  CO2 29  GLUCOSE 148*  BUN 11  CREATININE 0.46  CALCIUM 8.1*   PT/INR No results for input(s): LABPROT, INR in the last 72 hours. Comprehensive Metabolic Panel:    Component Value Date/Time   NA 140 06/30/2015 0448   NA 140 06/21/2015 1430   K 4.0 06/30/2015 0448   K 3.9 06/21/2015 1430   CL 106 06/30/2015 0448   CL 106 06/21/2015 1430   CO2 29 06/30/2015 0448   CO2 28 06/21/2015 1430   BUN 11 06/30/2015 0448   BUN 13 06/21/2015 1430   CREATININE 0.46 06/30/2015 0448   CREATININE 0.65 06/21/2015 1430   GLUCOSE 148* 06/30/2015 0448   GLUCOSE 96 06/21/2015 1430   CALCIUM 8.1* 06/30/2015 0448   CALCIUM 9.0 06/21/2015 1430   AST 18 03/13/2015 1612   AST 23 02/05/2015 1806   ALT 22 03/13/2015 1612   ALT 23 02/05/2015 1806   ALKPHOS 114 03/13/2015 1612   ALKPHOS 95  02/05/2015 1806   BILITOT 0.2 03/13/2015 1612   BILITOT 0.6 02/05/2015 1806   PROT 6.6 03/13/2015 1612   PROT 6.3* 02/05/2015 1806   ALBUMIN 4.1 03/13/2015 1612   ALBUMIN 3.6 02/05/2015 1806    Studies/Results: Dg Chest Port 1 View  06/29/2015   CLINICAL DATA:  Postop from a Nissen fundoplication.  EXAM: PORTABLE CHEST 1 VIEW  COMPARISON:  03/13/2015  FINDINGS: There is patchy perihilar airspace opacity extending to the medial lung bases, left greater than right. Left hemidiaphragm is obscured. There is central vascular congestion accentuated by low lung volumes.  Heart is normal in size.  There is a tiny left apical pneumothorax.  No right pneumothorax.  Bony thorax is intact.  IMPRESSION: 1. There is perihilar and lung base opacity, left greater than right, which may all reflect atelectasis. A component of central pulmonary edema is possible. There is a probable small left pleural effusion. 2. Tiny left apical pneumothorax.   Electronically Signed   By: Lajean Manes M.D.   On: 06/29/2015 12:48   Dg Esophagus W/water Sol Cm  06/30/2015   CLINICAL DATA:  64 year old with history of hiatal hernia and recent Nissen fundoplication.  EXAM: ESOPHOGRAM/BARIUM SWALLOW  TECHNIQUE: Single contrast examination was performed using water-soluble contrast.  FLUOROSCOPY TIME:  Fluoroscopy Time:  44 seconds  COMPARISON:  Chest radiograph 06/29/2015  FINDINGS: Scout image demonstrates gas in the stomach. The esophagus is moderately distended but contrast rapidly empties into the stomach. There is narrowing at the GE junction compatible with a fundoplication.  There is no evidence for contrast extravasation. Small amount of gas at the fundoplication region.  Opacity at the left lung base is most compatible with consolidation or volume loss.  IMPRESSION: Moderate dilatation of the esophagus without obstruction. Changes compatible with recent fundoplication.  No evidence for postoperative leak.   Electronically Signed    By: Markus Daft M.D.   On: 06/30/2015 11:44    Anti-infectives: Anti-infectives    Start     Dose/Rate Route Frequency Ordered Stop   06/29/15 0601  ceFAZolin (ANCEF) IVPB 2 g/50 mL premix     2 g 100 mL/hr over 30 Minutes Intravenous On call to O.R. 06/29/15 0601 06/29/15 0745      Assessment & Plans: Status post Nissen fundoplication Gastrograffin swallow study OK Advance to full liquid diet  Discontinue oxygen for Sat > 92% Encouraged OOB, ambulation, IS use  Likely home in AM 9/26 per Dr. Alroy Bailiff, MD, [facility] Office: [place] 07/01/2015

## 2015-07-02 ENCOUNTER — Inpatient Hospital Stay (HOSPITAL_COMMUNITY): Payer: 59

## 2015-07-02 ENCOUNTER — Ambulatory Visit: Payer: 59 | Admitting: Internal Medicine

## 2015-07-02 NOTE — Discharge Summary (Signed)
Physician Discharge Summary  Patient ID: Meghan Avila MRN: [id number] DOB/AGE: [birth year] 64 y.o.  Admit date: 06/29/2015 Discharge date: 07/02/2015  Admission Diagnoses:s/p robotic HHR and nissen fundoplication  Discharge Diagnoses:  Active Problems:   S/P Nissen fundoplication (without gastrostomy tube) procedure   Discharged Condition: good  Hospital Course: 64 y/o F s/p HHR and nissen.  Pt was admitted to the floor with no problems.  She underwent swallow study which revealed no leak.  She was started on clear liq and transitioned to FLD.  She tolerated that well with no issues.  She was ambulating well with no issues.  She had good pain control.    Consults: none  Significant Diagnostic Studies: swallow with no leak  Treatments: surgery: as above  Discharge Exam: Blood pressure 135/73, pulse 80, temperature 98.3 F (36.8 C), temperature source Oral, resp. rate 18, height 5' (1.524 m), weight 68.04 kg (150 lb), SpO2 94 %. General appearance: alert and cooperative GI: soft, non-tender; bowel sounds normal; no masses,  no organomegaly and incisions c/d/i  Disposition: 01-Home or Self Care  Discharge Instructions    Call MD for:  extreme fatigue    Complete by:  As directed      Call MD for:  hives    Complete by:  As directed      Call MD for:  persistant nausea and vomiting    Complete by:  As directed      Call MD for:  redness, tenderness, or signs of infection (pain, swelling, redness, odor or green/yellow discharge around incision site)    Complete by:  As directed      Call MD for:  severe uncontrolled pain    Complete by:  As directed      Call MD for:    Complete by:  As directed   Temperature > 101.84F     Diet - low sodium heart healthy    Complete by:  As directed      Discharge instructions    Complete by:  As directed   Please see discharge instruction sheets.  Also refer to handout given an office.  Please call our office if you have any questions or  concerns [phone]     Discharge wound care:    Complete by:  As directed   If you have closed incisions, shower and bathe over these incisions with soap and water every day.  Remove all surgical dressings on postoperative day #3.  You do not need to replace dressings over the closed incisions unless you feel more comfortable with a Band-Aid covering it.   If you have an open wound that requires packing, please see wound care instructions.  In general, remove all dressings, wash wound with soap and water and then replace with saline moistened gauze.  Do the dressing change at least every day.  Please call our office [phone] if you have further questions.     Driving Restrictions    Complete by:  As directed   No driving until off narcotics and can safely swerve away without pain during an emergency     Increase activity slowly    Complete by:  As directed   Walk an hour a day.  Use 20-30 minute walks.  When you can walk 30 minutes without difficulty, it is fine to restart low impact/moderate activities such as biking, jogging, swimming, sexual activity, etc.  Eventually you can increase to unrestricted activity when not feeling pain.  If you  feel pain: STOP!Marland Kitchen   Let pain protect you from overdoing it.  Use ice/heat & over-the-counter pain medications to help minimize soreness.  If that is not enough, then use your narcotic pain prescription as needed to remain active.  It is better to take extra pain medications and be more active than to stay bedridden to avoid all pain medications.     Lifting restrictions    Complete by:  As directed   Avoid heavy lifting initially.  Do not push through pain.  You have no specific weight limit - if it hurts to do, DON'T DO IT.   If you feel no pain, you are not injuring anything.  Pain will protect you from injury.  Coughing and sneezing are far more stressful to your incision than any lifting.  Avoid resuming heavy lifting / intense activity until off all  narcotic pain medications.  When ready to exercise more, give yourself 2 weeks to gradually get back to full intense exercise/activity.     May shower / Bathe    Complete by:  As directed      May walk up steps    Complete by:  As directed      Sexual Activity Restrictions    Complete by:  As directed   Sexual activity as tolerated.  Do not push through pain.  Pain will protect you from injury.     Walk with assistance    Complete by:  As directed   Walk over an hour a day.  May use a walker/cane/companion to help with balance and stamina.            Medication List    TAKE these medications        acetaminophen 500 MG tablet  Commonly known as:  TYLENOL  Take 1,000 mg by mouth every 6 (six) hours as needed for mild pain, moderate pain or fever.     aspirin-acetaminophen-caffeine 027-741-28 MG per tablet  Commonly known as:  EXCEDRIN MIGRAINE  Take 1 tablet by mouth every 6 (six) hours as needed for headache.     furosemide 20 MG tablet  Commonly known as:  LASIX  Take 20 mg by mouth daily as needed for fluid.     GLUCOSAMINE CHONDROITIN ADV Tabs  Take 1 tablet by mouth daily.     ibuprofen 200 MG tablet  Commonly known as:  ADVIL,MOTRIN  Take 200 mg by mouth every 6 (six) hours as needed for mild pain or moderate pain.     multivitamin with minerals Tabs tablet  Take 1 tablet by mouth daily.     pantoprazole 40 MG tablet  Commonly known as:  PROTONIX  TAKE 1 TABLET (40 MG TOTAL) BY MOUTH DAILY.     Vitamin D (Ergocalciferol) 50000 UNITS Caps capsule  Commonly known as:  DRISDOL  Take 50,000 Units by mouth every 7 (seven) days. Saturday           Follow-up Information    Follow up with Rosario Jacks., Anne Hahn, MD. Schedule an appointment as soon as possible for a visit in 3 weeks.   Specialty:  General Surgery   Why:  To follow up after your operation, To follow up after your hospital stay   Contact information:   [address] [place]  [phone]       Signed: Rosario Jacks., [facility] 07/02/2015, 9:49 AM

## 2015-07-09 ENCOUNTER — Encounter (HOSPITAL_COMMUNITY): Admission: RE | Payer: Self-pay | Source: Ambulatory Visit

## 2015-07-09 ENCOUNTER — Ambulatory Visit (HOSPITAL_COMMUNITY): Admission: RE | Admit: 2015-07-09 | Payer: 59 | Source: Ambulatory Visit | Admitting: Gastroenterology

## 2015-07-09 SURGERY — ESOPHAGOGASTRODUODENOSCOPY (EGD) WITH PROPOFOL
Anesthesia: Monitor Anesthesia Care

## 2015-07-10 ENCOUNTER — Ambulatory Visit: Payer: 59

## 2015-08-16 ENCOUNTER — Ambulatory Visit
Admission: RE | Admit: 2015-08-16 | Discharge: 2015-08-16 | Disposition: A | Discharge status: Home / Self Care | Payer: 59 | Source: Ambulatory Visit | Attending: Family Medicine | Admitting: Family Medicine

## 2015-08-16 ENCOUNTER — Ambulatory Visit: Payer: Self-pay | Admitting: General Surgery

## 2015-08-16 DIAGNOSIS — M8580 Other specified disorders of bone density and structure, unspecified site: Secondary | ICD-10-CM

## 2015-08-16 NOTE — H&P (Signed)
Meghan Avila is an 64 y.o. female.   Chief Complaint: back lipoma HPI: patient is a 64 year old female with a multiple year history of a left back mass.  She states his gotten bigger recently.  She states becoming more bothersome.  Patient has had no draining previously.  She would like to have this removed.  Past Medical History  Diagnosis Date  . Arthritis   . GERD (gastroesophageal reflux disease)   . Leg swelling     left leg -edema knee level to ankle.  . Hand swelling     related to lymph nodes   . History of hiatal hernia   . H/O wheezing     occ.  Marland Kitchen Headache     migraines  . PONV (postoperative nausea and vomiting)   . Family history of adverse reaction to anesthesia     post nausea and vomitting  . Scoliosis     Past Surgical History  Procedure Laterality Date  . Hemorroidectomy  2002, 2005  . Scoliosis  1970, 1972    no retained hardware  . Breast reduction surgery  1997  . Laser ablation  2008    left leg   . Deep axillary sentinel node biopsy / excision      x2  . Cholecystectomy  1992    laparoscopic  . Esophagogastroduodenoscopy (egd) with propofol N/A 04/19/2015    Procedure: ESOPHAGOGASTRODUODENOSCOPY (EGD) WITH PROPOFOL;  Surgeon: Garlan Fair, MD;  Location: [facility];  Service: Endoscopy;  Laterality: N/A;  . Colonoscopy with propofol N/A 04/19/2015    Procedure: COLONOSCOPY WITH PROPOFOL;  Surgeon: Garlan Fair, MD;  Location: [facility];  Service: Endoscopy;  Laterality: N/A;  . Esophageal manometry N/A 05/04/2015    Procedure: ESOPHAGEAL MANOMETRY (EM);  Surgeon: Garlan Fair, MD;  Location: [facility];  Service: Endoscopy;  Laterality: N/A;  . Back surgery      scoliosis surgery 1970 and 1972  . Left knee surgery - torn meniscus (left)      Family History  Problem Relation Age of Onset  . Varicose Veins Mother   . Cancer Sister   . Heart disease Sister     before age 51  . Hypertension Sister   . Varicose Veins Sister   .  Heart attack Sister   . Hyperlipidemia Brother   . Rheum arthritis Father   . Rheum arthritis Sister   . Allergies Sister    Social History:  reports that she quit smoking about 36 years ago. Her smoking use included Cigarettes. She has a 5 pack-year smoking history. She has never used smokeless tobacco. She reports that she does not drink alcohol or use illicit drugs.  Allergies:  Allergies  Allergen Reactions  . Zantac [Ranitidine Hcl] Anaphylaxis  . Hydrocodone Nausea And Vomiting  . Morphine And Related Nausea And Vomiting  . Prednisone Diarrhea and Nausea And Vomiting  . Tramadol Nausea And Vomiting     (Not in a hospital admission)  No results found for this or any previous visit (from the past 48 hour(s)). Dg Bone Density  08/16/2015  EXAM: DUAL X-RAY ABSORPTIOMETRY (DXA) FOR BONE MINERAL DENSITY IMPRESSION: Referring Physician:  Tawanna Solo PATIENT: Name: Meghan Avila, Meghan Avila Patient ID: [id number] Birth Date: 02-Mar-1951 Height: 60.0 in. Sex: Female Measured: 08/16/2015 Weight: 143.0 lbs. Indications: Caucasian, Estrogen Deficient, Family History of Osteoporosis, Height Loss (781.91), Hysterectomy, OSTEOARTHRITIS, Postmenopausal, Secondary Osteoporosis Fractures: Treatments: Calcium (E943.0) ASSESSMENT: The BMD measured at Forearm Radius 33% is 0.609  g/cm2 with a T-score of -3.1. This patient is considered osteoporotic according to [facility]) criteria. Lumbar spine was not utilized due to scoliosis. There has been no statistically significant change in BMD of Left hip since prior exam dated 08/02/2009. Site Region Measured Date Measured Age WHO YA BMD Significant CHANGE Classification T-score Left Forearm Radius 33% 08/16/2015 64.8 Osteoporosis -3.1 0.609 g/cm2 DualFemur Neck Left 08/16/2015 64.8 Osteopenia -1.5 0.834 g/cm2 World Health Organization [facility]) criteria for post-menopausal, Caucasian Women: Normal       T-score at or above -1 SD Osteopenia   T-score  between -1 and -2.5 SD Osteoporosis T-score at or below -2.5 SD RECOMMENDATION: [place] recommends that FDA-approved medical therapies be considered in postmenopausal women and men age 27 or older with a: 1. Hip or vertebral (clinical or morphometric) fracture. 2. T-score of <-2.5 at the spine or hip. 3. Ten-year fracture probability by FRAX of 3% or greater for hip fracture or 20% or greater for major osteoporotic fracture. All treatment decisions require clinical judgment and consideration of individual patient factors, including patient preferences, co-morbidities, previous drug use, risk factors not captured in the FRAX model (e.g. falls, vitamin D deficiency, increased bone turnover, interval significant decline in bone density) and possible under - or over-estimation of fracture risk by FRAX. All patients should ensure an adequate intake of dietary calcium (1200 mg/d) and vitamin D (800 IU daily) unless contraindicated. FOLLOW-UP: People with diagnosed cases of osteoporosis or at high risk for fracture should have regular bone mineral density tests. For patients eligible for Medicare, routine testing is allowed once every 2 years. The testing frequency can be increased to one year for patients who have rapidly progressing disease, those who are receiving or discontinuing medical therapy to restore bone mass, or have additional risk factors. I have reviewed this report, and agree with the above findings. [facility] Electronically Signed   By: David  Martinique M.D.   On: 08/16/2015 08:31    Review of Systems  Constitutional: Negative.   HENT: Negative.   Respiratory: Negative.   Cardiovascular: Negative.   Gastrointestinal: Negative.   Skin: Negative.   Neurological: Negative.     There were no vitals taken for this visit. Physical Exam  Constitutional: She is oriented to person, place, and time. She appears well-developed and well-nourished.  HENT:  Head:  Normocephalic and atraumatic.  Eyes: Conjunctivae and EOM are normal. Pupils are equal, round, and reactive to light.  Neck: Normal range of motion. Neck supple.  Cardiovascular: Normal rate and normal heart sounds.   Respiratory: Effort normal and breath sounds normal.  GI: Soft. Bowel sounds are normal.  Musculoskeletal: Normal range of motion.  Neurological: She is alert and oriented to person, place, and time.  Skin: Skin is warm and dry.      6X 5 cm, subcutaneous, mobile     Assessment/Plan 64 year old female with left back lipoma  1.  The patient will like to proceed to the operating room for excision of lipoma 2.  I discusseith her the risks and benefits the procedure to include but not limited to: Infection, bleeding, damages to surrounding structures, and possible recurrence.  The patient voices understanding and wishes to proceed.  Meghan Jacks., Meghan Avila 08/16/2015, 9:03 AM

## 2015-09-13 NOTE — Patient Instructions (Signed)
Meghan Avila  09/13/2015   Your procedure is scheduled on: 12/16/116  Report to [facility] Main  Entrance take [facility]  elevators to 3rd floor to  [place] at   0730 AM.  Call this number if you have problems the morning of surgery [phone]   Remember: ONLY 1 PERSON MAY GO WITH YOU TO SHORT STAY TO GET  READY MORNING OF [name].  Do not eat food or drink liquids :After Midnight.     Take these medicines the morning of surgery with A SIP OF WATER: none                                 You may not have any metal on your body including hair pins and              piercings  Do not wear jewelry, make-up, lotions, powders or perfumes, deodorant             Do not wear nail polish.  Do not shave  48 hours prior to surgery.               Do not bring valuables to the hospital. [place].  Contacts, dentures or bridgework may not be worn into surgery.      Patients discharged the day of surgery will not be allowed to drive home.  Name and phone number of your driver:  Special Instructions:  Coughing and deep breathing exercises, leg exercises               Please read over the following fact sheets you were given: _____________________________________________________________________             [facility] - Preparing for Surgery Before surgery, you can play an important role.  Because skin is not sterile, your skin needs to be as free of germs as possible.  You can reduce the number of germs on your skin by washing with CHG (chlorahexidine gluconate) soap before surgery.  CHG is an antiseptic cleaner which kills germs and bonds with the skin to continue killing germs even after washing. Please DO NOT use if you have an allergy to CHG or antibacterial soaps.  If your skin becomes reddened/irritated stop using the CHG and inform your nurse when you arrive at Short Stay. Do not shave (including legs  and underarms) for at least 48 hours prior to the first CHG shower.  You may shave your face/neck. Please follow these instructions carefully:  1.  Shower with CHG Soap the night before surgery and the  morning of Surgery.  2.  If you choose to wash your hair, wash your hair first as usual with your  normal  shampoo.  3.  After you shampoo, rinse your hair and body thoroughly to remove the  shampoo.                           4.  Use CHG as you would any other liquid soap.  You can apply chg directly  to the skin and wash  Gently with a scrungie or clean washcloth.  5.  Apply the CHG Soap to your body ONLY FROM THE NECK DOWN.   Do not use on face/ open                           Wound or open sores. Avoid contact with eyes, ears mouth and genitals (private parts).                       Wash face,  Genitals (private parts) with your normal soap.             6.  Wash thoroughly, paying special attention to the area where your surgery  will be performed.  7.  Thoroughly rinse your body with warm water from the neck down.  8.  DO NOT shower/wash with your normal soap after using and rinsing off  the CHG Soap.                9.  Pat yourself dry with a clean towel.            10.  Wear clean pajamas.            11.  Place clean sheets on your bed the night of your first shower and do not  sleep with pets. Day of Surgery : Do not apply any lotions/deodorants the morning of surgery.  Please wear clean clothes to the hospital/surgery center.  FAILURE TO FOLLOW THESE INSTRUCTIONS MAY RESULT IN THE CANCELLATION OF YOUR SURGERY PATIENT SIGNATURE_________________________________  NURSE SIGNATURE__________________________________  ________________________________________________________________________

## 2015-09-14 ENCOUNTER — Encounter (HOSPITAL_COMMUNITY): Payer: Self-pay

## 2015-09-14 ENCOUNTER — Encounter (HOSPITAL_COMMUNITY)
Admission: RE | Admit: 2015-09-14 | Discharge: 2015-09-14 | Disposition: A | Discharge status: Home / Self Care | Payer: 59 | Source: Ambulatory Visit | Attending: General Surgery | Admitting: General Surgery

## 2015-09-14 ENCOUNTER — Ambulatory Visit (HOSPITAL_COMMUNITY)
Admission: RE | Admit: 2015-09-14 | Discharge: 2015-09-14 | Disposition: A | Discharge status: Home / Self Care | Payer: 59 | Source: Ambulatory Visit | Attending: Anesthesiology | Admitting: Anesthesiology

## 2015-09-14 DIAGNOSIS — Z01812 Encounter for preprocedural laboratory examination: Secondary | ICD-10-CM | POA: Diagnosis not present

## 2015-09-14 DIAGNOSIS — R918 Other nonspecific abnormal finding of lung field: Secondary | ICD-10-CM | POA: Diagnosis not present

## 2015-09-14 DIAGNOSIS — Z01818 Encounter for other preprocedural examination: Secondary | ICD-10-CM | POA: Diagnosis not present

## 2015-09-14 DIAGNOSIS — R0602 Shortness of breath: Secondary | ICD-10-CM | POA: Diagnosis not present

## 2015-09-14 HISTORY — DX: Peripheral vascular disease, unspecified: I73.9

## 2015-09-14 HISTORY — DX: Age-related osteoporosis without current pathological fracture: M81.0

## 2015-09-14 LAB — BASIC METABOLIC PANEL
Anion gap: 6 (ref 5–15)
BUN: 15 mg/dL (ref 6–20)
CO2: 29 mmol/L (ref 22–32)
Calcium: 9.1 mg/dL (ref 8.9–10.3)
Chloride: 107 mmol/L (ref 101–111)
Creatinine, Ser: 0.61 mg/dL (ref 0.44–1.00)
GFR calc Af Amer: >60 mL/min (ref >60)
GFR calc non Af Amer: >60 mL/min (ref >60)
Glucose, Bld: 96 mg/dL (ref 65–99)
Potassium: 4.2 mmol/L (ref 3.5–5.1)
Sodium: 142 mmol/L (ref 135–145)

## 2015-09-14 LAB — CBC
HCT: 36.4 % (ref 36.0–46.0)
Hemoglobin: 11.3 g/dL — ABNORMAL LOW (ref 12.0–15.0)
MCH: 24.1 pg — ABNORMAL LOW (ref 26.0–34.0)
MCHC: 31 g/dL (ref 30.0–36.0)
MCV: 77.6 fL — ABNORMAL LOW (ref 78.0–100.0)
Platelets: 258 10*3/uL (ref 150–400)
RBC: 4.69 MIL/uL (ref 3.87–5.11)
RDW: 16.5 % — ABNORMAL HIGH (ref 11.5–15.5)
WBC: 6.3 10*3/uL (ref 4.0–10.5)

## 2015-09-14 NOTE — Progress Notes (Signed)
[id number]  LOV with pulmonary- 6/16-EPIC

## 2015-09-17 NOTE — Progress Notes (Signed)
Final EKg done 09/14/15 in EPIC.

## 2015-09-18 NOTE — Anesthesia Preprocedure Evaluation (Addendum)
Anesthesia Evaluation  Patient identified by MRN, date of birth, ID band Patient awake    Reviewed: Allergy & Precautions, NPO status , Patient's Chart, lab work & pertinent test results  History of Anesthesia Complications (+) PONV  Airway Mallampati: II   Neck ROM: Full    Dental  (+) Dental Advisory Given, Teeth Intact   Pulmonary former smoker (quit over 30 years ago, 5 pack year hx),    breath sounds clear to auscultation       Cardiovascular + Peripheral Vascular Disease   Rhythm:Regular  EKG 09/2015 OK, ECHO 2015 EF 60%, upper arm swelling related to lymph nodes   Neuro/Psych  Headaches,    GI/Hepatic hiatal hernia, GERD  Medicated,  Endo/Other    Renal/GU      Musculoskeletal  (+) Arthritis ,   Abdominal (+)  Abdomen: soft.    Peds  Hematology 11/34   Anesthesia Other Findings   Reproductive/Obstetrics                            Anesthesia Physical Anesthesia Plan  ASA: II  Anesthesia Plan: General   Post-op Pain Management:    Induction: Intravenous  Airway Management Planned: Oral ETT  Additional Equipment:   Intra-op Plan:   Post-operative Plan:   Informed Consent: I have reviewed the patients History and Physical, chart, labs and discussed the procedure including the risks, benefits and alternatives for the proposed anesthesia with the patient or authorized representative who has indicated his/her understanding and acceptance.     Plan Discussed with:   Anesthesia Plan Comments: (ETT, HX of GERDS, Hiatal Hernia, Lipoma on Back)        Anesthesia Quick Evaluation

## 2015-09-21 ENCOUNTER — Encounter (HOSPITAL_COMMUNITY): Payer: Self-pay | Admitting: *Deleted

## 2015-09-21 ENCOUNTER — Ambulatory Visit (HOSPITAL_COMMUNITY): Payer: 59 | Admitting: Anesthesiology

## 2015-09-21 ENCOUNTER — Encounter (HOSPITAL_COMMUNITY)
Admission: RE | Disposition: A | Discharge status: Home / Self Care | Payer: Self-pay | Source: Ambulatory Visit | Attending: General Surgery

## 2015-09-21 ENCOUNTER — Ambulatory Visit (HOSPITAL_COMMUNITY)
Admission: RE | Admit: 2015-09-21 | Discharge: 2015-09-21 | Disposition: A | Discharge status: Home / Self Care | Payer: 59 | Source: Ambulatory Visit | Attending: General Surgery | Admitting: General Surgery

## 2015-09-21 DIAGNOSIS — M1990 Unspecified osteoarthritis, unspecified site: Secondary | ICD-10-CM | POA: Insufficient documentation

## 2015-09-21 DIAGNOSIS — Z9049 Acquired absence of other specified parts of digestive tract: Secondary | ICD-10-CM | POA: Insufficient documentation

## 2015-09-21 DIAGNOSIS — K219 Gastro-esophageal reflux disease without esophagitis: Secondary | ICD-10-CM | POA: Diagnosis not present

## 2015-09-21 DIAGNOSIS — K449 Diaphragmatic hernia without obstruction or gangrene: Secondary | ICD-10-CM | POA: Insufficient documentation

## 2015-09-21 DIAGNOSIS — D171 Benign lipomatous neoplasm of skin and subcutaneous tissue of trunk: Secondary | ICD-10-CM | POA: Diagnosis present

## 2015-09-21 DIAGNOSIS — I739 Peripheral vascular disease, unspecified: Secondary | ICD-10-CM | POA: Diagnosis not present

## 2015-09-21 DIAGNOSIS — Z87891 Personal history of nicotine dependence: Secondary | ICD-10-CM | POA: Insufficient documentation

## 2015-09-21 HISTORY — PX: LIPOMA EXCISION: SHX5283

## 2015-09-21 SURGERY — EXCISION LIPOMA
Anesthesia: General | Site: Back

## 2015-09-21 MED ORDER — SODIUM CHLORIDE 0.9 % IR SOLN
Status: DC | PRN
  Administered 2015-09-21: 1000 mL

## 2015-09-21 MED ORDER — BUPIVACAINE-EPINEPHRINE 0.25% -1:200000 IJ SOLN
INTRAMUSCULAR | Status: DC | PRN
  Administered 2015-09-21: 10 mL

## 2015-09-21 MED ORDER — FENTANYL CITRATE (PF) 100 MCG/2ML IJ SOLN
INTRAMUSCULAR | Status: DC | PRN
  Administered 2015-09-21: 50 ug via INTRAVENOUS

## 2015-09-21 MED ORDER — SODIUM CHLORIDE 0.9 % IJ SOLN: 3.0000 mL | Freq: Two times a day (BID) | INTRAMUSCULAR | Status: DC

## 2015-09-21 MED ORDER — FENTANYL CITRATE (PF) 250 MCG/5ML IJ SOLN
INTRAMUSCULAR | Status: AC
  Filled 2015-09-21: qty 5

## 2015-09-21 MED ORDER — SCOPOLAMINE 1 MG/3DAYS TD PT72
1.0000 | MEDICATED_PATCH | TRANSDERMAL | Status: DC
  Administered 2015-09-21: 1.5 mg via TRANSDERMAL
  Filled 2015-09-21: qty 1

## 2015-09-21 MED ORDER — OXYCODONE-ACETAMINOPHEN 5-325 MG PO TABS: 1.0000 | ORAL_TABLET | ORAL | Status: DC | PRN

## 2015-09-21 MED ORDER — PROPOFOL 10 MG/ML IV BOLUS
INTRAVENOUS | Status: DC | PRN
  Administered 2015-09-21: 150 mg via INTRAVENOUS

## 2015-09-21 MED ORDER — ACETAMINOPHEN 325 MG PO TABS: 650.0000 mg | ORAL_TABLET | ORAL | Status: DC | PRN

## 2015-09-21 MED ORDER — CEFAZOLIN SODIUM-DEXTROSE 2-3 GM-% IV SOLR
INTRAVENOUS | Status: AC
Start: 2015-09-21 — End: 2015-09-21
  Filled 2015-09-21: qty 50

## 2015-09-21 MED ORDER — LIDOCAINE HCL (CARDIAC) 20 MG/ML IV SOLN
INTRAVENOUS | Status: AC
  Filled 2015-09-21: qty 5

## 2015-09-21 MED ORDER — LIDOCAINE HCL (CARDIAC) 20 MG/ML IV SOLN
INTRAVENOUS | Status: DC | PRN
  Administered 2015-09-21: 60 mg via INTRAVENOUS

## 2015-09-21 MED ORDER — PROMETHAZINE HCL 25 MG/ML IJ SOLN: 6.2500 mg | INTRAMUSCULAR | Status: DC | PRN

## 2015-09-21 MED ORDER — FENTANYL CITRATE (PF) 100 MCG/2ML IJ SOLN
25.0000 ug | INTRAMUSCULAR | Status: DC | PRN
  Administered 2015-09-21: 25 ug via INTRAVENOUS

## 2015-09-21 MED ORDER — SUCCINYLCHOLINE CHLORIDE 20 MG/ML IJ SOLN
INTRAMUSCULAR | Status: DC | PRN
  Administered 2015-09-21: 60 mg via INTRAVENOUS

## 2015-09-21 MED ORDER — ONDANSETRON HCL 4 MG/2ML IJ SOLN
INTRAMUSCULAR | Status: DC | PRN
  Administered 2015-09-21: 4 mg via INTRAVENOUS

## 2015-09-21 MED ORDER — ONDANSETRON HCL 4 MG/2ML IJ SOLN
INTRAMUSCULAR | Status: AC
  Filled 2015-09-21: qty 2

## 2015-09-21 MED ORDER — LACTATED RINGERS IV SOLN
INTRAVENOUS | Status: DC
  Administered 2015-09-21: 1000 mL via INTRAVENOUS

## 2015-09-21 MED ORDER — PROPOFOL 10 MG/ML IV BOLUS
INTRAVENOUS | Status: AC
  Filled 2015-09-21: qty 20

## 2015-09-21 MED ORDER — FENTANYL CITRATE (PF) 100 MCG/2ML IJ SOLN
INTRAMUSCULAR | Status: AC
  Filled 2015-09-21: qty 2

## 2015-09-21 MED ORDER — MIDAZOLAM HCL 2 MG/2ML IJ SOLN
INTRAMUSCULAR | Status: AC
  Filled 2015-09-21: qty 2

## 2015-09-21 MED ORDER — MEPERIDINE HCL 50 MG/ML IJ SOLN: 6.2500 mg | INTRAMUSCULAR | Status: DC | PRN

## 2015-09-21 MED ORDER — CHLORHEXIDINE GLUCONATE 4 % EX LIQD: 1.0000 "application " | Freq: Once | CUTANEOUS | Status: DC

## 2015-09-21 MED ORDER — MIDAZOLAM HCL 5 MG/5ML IJ SOLN
INTRAMUSCULAR | Status: DC | PRN
  Administered 2015-09-21: 2 mg via INTRAVENOUS

## 2015-09-21 MED ORDER — BUPIVACAINE-EPINEPHRINE 0.25% -1:200000 IJ SOLN
INTRAMUSCULAR | Status: AC
  Filled 2015-09-21: qty 1

## 2015-09-21 MED ORDER — ACETAMINOPHEN 650 MG RE SUPP
650.0000 mg | RECTAL | Status: DC | PRN
  Filled 2015-09-21: qty 1

## 2015-09-21 MED ORDER — SODIUM CHLORIDE 0.9 % IJ SOLN: 3.0000 mL | INTRAMUSCULAR | Status: DC | PRN

## 2015-09-21 MED ORDER — EPHEDRINE SULFATE 50 MG/ML IJ SOLN
INTRAMUSCULAR | Status: DC | PRN
Start: 2015-09-21 — End: 2015-09-21
  Administered 2015-09-21: 5 mg via INTRAVENOUS
  Administered 2015-09-21: 10 mg via INTRAVENOUS

## 2015-09-21 MED ORDER — OXYCODONE HCL 5 MG PO TABS: 5.0000 mg | ORAL_TABLET | ORAL | Status: DC | PRN

## 2015-09-21 MED ORDER — CEFAZOLIN SODIUM-DEXTROSE 2-3 GM-% IV SOLR
2.0000 g | INTRAVENOUS | Status: AC
  Administered 2015-09-21: 2 g via INTRAVENOUS

## 2015-09-21 MED ORDER — ROCURONIUM BROMIDE 100 MG/10ML IV SOLN
INTRAVENOUS | Status: AC
  Filled 2015-09-21: qty 1

## 2015-09-21 MED ORDER — SODIUM CHLORIDE 0.9 % IV SOLN
250.0000 mL | INTRAVENOUS | Status: DC | PRN
Start: 2015-09-21 — End: 2015-09-21

## 2015-09-21 MED ORDER — MORPHINE SULFATE (PF) 10 MG/ML IV SOLN: 2.0000 mg | INTRAVENOUS | Status: DC | PRN

## 2015-09-21 SURGICAL SUPPLY — 37 items
BENZOIN TINCTURE PRP APPL 2/3 (GAUZE/BANDAGES/DRESSINGS) IMPLANT
BLADE HEX COATED 2.75 (ELECTRODE) ×2 IMPLANT
BLADE SURG SZ10 CARB STEEL (BLADE) ×4 IMPLANT
COVER SURGICAL LIGHT HANDLE (MISCELLANEOUS) ×2 IMPLANT
DECANTER SPIKE VIAL GLASS SM (MISCELLANEOUS) IMPLANT
DRAPE LAPAROTOMY T 102X78X121 (DRAPES) IMPLANT
DRAPE LAPAROTOMY T 98X78 PEDS (DRAPES) ×2 IMPLANT
DRAPE LAPAROTOMY TRNSV 102X78 (DRAPE) IMPLANT
DRAPE SHEET LG 3/4 BI-LAMINATE (DRAPES) IMPLANT
ELECT PENCIL ROCKER SW 15FT (MISCELLANEOUS) ×2 IMPLANT
ELECT REM PT RETURN 9FT ADLT (ELECTROSURGICAL) ×2
ELECTRODE REM PT RTRN 9FT ADLT (ELECTROSURGICAL) ×1 IMPLANT
EVACUATOR SILICONE 100CC (DRAIN) IMPLANT
GAUZE SPONGE 4X4 12PLY STRL (GAUZE/BANDAGES/DRESSINGS) IMPLANT
GLOVE BIO SURGEON STRL SZ7.5 (GLOVE) ×4 IMPLANT
GLOVE BIOGEL PI IND STRL 7.0 (GLOVE) ×1 IMPLANT
GLOVE BIOGEL PI INDICATOR 7.0 (GLOVE) ×1
GOWN STRL REUS W/ TWL XL LVL3 (GOWN DISPOSABLE) ×2 IMPLANT
GOWN STRL REUS W/TWL LRG LVL3 (GOWN DISPOSABLE) IMPLANT
GOWN STRL REUS W/TWL XL LVL3 (GOWN DISPOSABLE) ×2 IMPLANT
KIT BASIN OR (CUSTOM PROCEDURE TRAY) ×2 IMPLANT
LIQUID BAND (GAUZE/BANDAGES/DRESSINGS) ×2 IMPLANT
NEEDLE HYPO 25X1 1.5 SAFETY (NEEDLE) ×2 IMPLANT
NS IRRIG 1000ML POUR BTL (IV SOLUTION) ×2 IMPLANT
PACK BASIC VI WITH GOWN DISP (CUSTOM PROCEDURE TRAY) ×2 IMPLANT
PEN SKIN MARKING BROAD (MISCELLANEOUS) IMPLANT
SOL PREP POV-IOD 4OZ 10% (MISCELLANEOUS) ×2 IMPLANT
SPONGE LAP 18X18 X RAY DECT (DISPOSABLE) IMPLANT
SPONGE LAP 4X18 X RAY DECT (DISPOSABLE) ×2 IMPLANT
STAPLER VISISTAT 35W (STAPLE) IMPLANT
SUT MNCRL AB 3-0 PS2 18 (SUTURE) ×2 IMPLANT
SUT MNCRL AB 4-0 PS2 18 (SUTURE) IMPLANT
SUT VIC AB 3-0 SH 18 (SUTURE) ×2 IMPLANT
SYR CONTROL 10ML LL (SYRINGE) ×2 IMPLANT
TOWEL OR 17X26 10 PK STRL BLUE (TOWEL DISPOSABLE) ×2 IMPLANT
WATER STERILE IRR 1500ML POUR (IV SOLUTION) IMPLANT
YANKAUER SUCT BULB TIP 10FT TU (MISCELLANEOUS) IMPLANT

## 2015-09-21 NOTE — Anesthesia Postprocedure Evaluation (Signed)
Anesthesia Post Note  Patient: Meghan Avila  Procedure(s) Performed: Procedure(s) (LRB): EXCISION 6CM BACK LIPOMA (N/A)  Patient location during evaluation: PACU Anesthesia Type: General Level of consciousness: awake and alert Pain management: pain level controlled Vital Signs Assessment: post-procedure vital signs reviewed and stable Respiratory status: spontaneous breathing, nonlabored ventilation, respiratory function stable and patient connected to nasal cannula oxygen Cardiovascular status: blood pressure returned to baseline and stable Postop Assessment: no signs of nausea or vomiting Anesthetic complications: no    Last Vitals:  Filed Vitals:   09/21/15 0732 09/21/15 0927  BP: 134/77 106/56  Pulse: 80 80  Temp: 36.5 C   Resp: 20 19    Last Pain:  Filed Vitals:   09/21/15 0938  PainSc: 0-No pain                 Bentlee Drier

## 2015-09-21 NOTE — Op Note (Signed)
09/21/2015  9:20 AM  PATIENT:  Meghan Avila  64 y.o. female  PRE-OPERATIVE DIAGNOSIS:  BACK LIPOMA   POST-OPERATIVE DIAGNOSIS:  BACK LIPOMA 6x6CM SQ   PROCEDURE:  Procedure(s): EXCISION 6x6CM BACK LIPOMA (N/A)  SURGEON:  Surgeon(s) and Role:    * Ralene Ok, MD - Primary   ANESTHESIA:   local and general  EBL:   <5cc  BLOOD ADMINISTERED:none  DRAINS: none   LOCAL MEDICATIONS USED:  BUPIVICAINE   SPECIMEN:  Source of Specimen:  Back mass 6x6cm  DISPOSITION OF SPECIMEN:  PATHOLOGY  COUNTS:  YES  TOURNIQUET:  * No tourniquets in log *  DICTATION: .Dragon Dictation  After the patient was consented she was taken back to the operating room and placed in the supine position with bilateral SCDs in place. Patient underwent general endotracheal anesthesia. Patient was in placed in the lateral position. Patient was then prepped and draped in the usual sterile fashion. A timeout was called all facts verified.  A 3 cm incision was made just over the area of the left back mass. Electrocautery was used to maintain hemostasis and dissection was taken down to the mass. Mass was in the subcutaneous space and fatty-appearing. This was extracted from the incision site. Maintain hemostasis. This wasn't measured and sent to pathology. The mass measured approximate 6 x 6 cm. At this time. Vicryl 3-0 interrupted fashion reapproximating the deep dermal space. Through Monocryl used to reapproximate skin. The subcutaneous spaces and infiltrated with 0.25% Marcaine for local anesthetic. Skin was dressed with liqui-band. The patient tolerated the procedure well. The patient was taken to the recovery room in stable condition.  PLAN OF CARE: Discharge to home after PACU  PATIENT DISPOSITION:  PACU - hemodynamically stable.   Delay start of Pharmacological VTE agent (>24hrs) due to surgical blood loss or risk of bleeding: not applicable

## 2015-09-21 NOTE — Anesthesia Procedure Notes (Signed)
Procedure Name: Intubation Date/Time: 09/21/2015 8:53 AM Performed by: Lind Covert Pre-anesthesia Checklist: Patient identified, Emergency Drugs available, Suction available and Patient being monitored Patient Re-evaluated:Patient Re-evaluated prior to inductionOxygen Delivery Method: Circle system utilized Preoxygenation: Pre-oxygenation with 100% oxygen Intubation Type: IV induction Ventilation: Mask ventilation without difficulty Laryngoscope Size: Miller and 2 Grade View: Grade I Tube type: Oral Tube size: 7.0 mm Number of attempts: 1 Airway Equipment and Method: Stylet Placement Confirmation: ETT inserted through vocal cords under direct vision,  positive ETCO2 and breath sounds checked- equal and bilateral Secured at: 20 cm Tube secured with: Tape Dental Injury: Teeth and Oropharynx as per pre-operative assessment

## 2015-09-21 NOTE — H&P (Signed)
Meghan Avila is an 64 y.o. female.  Chief Complaint: back lipoma HPI: patient is a 64 year old female with a multiple year history of a left back mass. She states his gotten bigger recently. She states becoming more bothersome. Patient has had no draining previously. She would like to have this removed.  Past Medical History  Diagnosis Date  . Arthritis   . GERD (gastroesophageal reflux disease)   . Leg swelling     left leg -edema knee level to ankle.  . Hand swelling     related to lymph nodes   . History of hiatal hernia   . H/O wheezing     occ.  Marland Kitchen Headache     migraines  . PONV (postoperative nausea and vomiting)   . Family history of adverse reaction to anesthesia     post nausea and vomitting  . Scoliosis     Past Surgical History  Procedure Laterality Date  . Hemorroidectomy  2002, 2005  . Scoliosis  1970, 1972    no retained hardware  . Breast reduction surgery  1997  . Laser ablation  2008    left leg   . Deep axillary sentinel node biopsy / excision      x2  . Cholecystectomy  1992    laparoscopic  . Esophagogastroduodenoscopy (egd) with propofol N/A 04/19/2015    Procedure: ESOPHAGOGASTRODUODENOSCOPY (EGD) WITH PROPOFOL; Surgeon: Garlan Fair, MD; Location: [facility]; Service: Endoscopy; Laterality: N/A;  . Colonoscopy with propofol N/A 04/19/2015    Procedure: COLONOSCOPY WITH PROPOFOL; Surgeon: Garlan Fair, MD; Location: [facility]; Service: Endoscopy; Laterality: N/A;  . Esophageal manometry N/A 05/04/2015    Procedure: ESOPHAGEAL MANOMETRY (EM); Surgeon: Garlan Fair, MD; Location: [facility]; Service: Endoscopy; Laterality: N/A;  . Back surgery      scoliosis surgery 1970 and 1972  . Left knee surgery - torn meniscus (left)      Family History  Problem Relation Age of Onset  . Varicose  Veins Mother   . Cancer Sister   . Heart disease Sister     before age 63  . Hypertension Sister   . Varicose Veins Sister   . Heart attack Sister   . Hyperlipidemia Brother   . Rheum arthritis Father   . Rheum arthritis Sister   . Allergies Sister    Social History:  reports that she quit smoking about 36 years ago. Her smoking use included Cigarettes. She has a 5 pack-year smoking history. She has never used smokeless tobacco. She reports that she does not drink alcohol or use illicit drugs.  Allergies:  Allergies  Allergen Reactions  . Zantac [Ranitidine Hcl] Anaphylaxis  . Hydrocodone Nausea And Vomiting  . Morphine And Related Nausea And Vomiting  . Prednisone Diarrhea and Nausea And Vomiting  . Tramadol Nausea And Vomiting     (Not in a hospital admission)   Lab Results Last 48 Hours    No results found for this or any previous visit (from the past 48 hour(s)).    Imaging Results (Last 48 hours)    Dg Bone Density  08/16/2015 EXAM: DUAL X-RAY ABSORPTIOMETRY (DXA) FOR BONE MINERAL DENSITY IMPRESSION: Referring Physician: Tawanna Solo PATIENT: Name: Meghan Avila Patient ID: [id number] Birth Date: [birth year] Height: 60.0 in. Sex: Female Measured: 08/16/2015 Weight: 143.0 lbs. Indications: Caucasian, Estrogen Deficient, Family History of Osteoporosis, Height Loss (781.91), Hysterectomy, OSTEOARTHRITIS, Postmenopausal, Secondary Osteoporosis Fractures: Treatments: Calcium (E943.0) ASSESSMENT: The BMD measured at Forearm Radius 33% is  0.609 g/cm2 with a T-score of -3.1. This patient is considered osteoporotic according to [facility]) criteria. Lumbar spine was not utilized due to scoliosis. There has been no statistically significant change in BMD of Left hip since prior exam dated 08/02/2009. Site Region Measured Date Measured Age WHO YA BMD Significant CHANGE Classification T-score Left  Forearm Radius 33% 08/16/2015 64.8 Osteoporosis -3.1 0.609 g/cm2 DualFemur Neck Left 08/16/2015 64.8 Osteopenia -1.5 0.834 g/cm2 World Health Organization [facility]) criteria for post-menopausal, Caucasian Women: Normal T-score at or above -1 SD Osteopenia T-score between -1 and -2.5 SD Osteoporosis T-score at or below -2.5 SD RECOMMENDATION: [place] recommends that FDA-approved medical therapies be considered in postmenopausal women and men age 62 or older with a: 1. Hip or vertebral (clinical or morphometric) fracture. 2. T-score of <-2.5 at the spine or hip. 3. Ten-year fracture probability by FRAX of 3% or greater for hip fracture or 20% or greater for major osteoporotic fracture. All treatment decisions require clinical judgment and consideration of individual patient factors, including patient preferences, co-morbidities, previous drug use, risk factors not captured in the FRAX model (e.g. falls, vitamin D deficiency, increased bone turnover, interval significant decline in bone density) and possible under - or over-estimation of fracture risk by FRAX. All patients should ensure an adequate intake of dietary calcium (1200 mg/d) and vitamin D (800 IU daily) unless contraindicated. FOLLOW-UP: People with diagnosed cases of osteoporosis or at high risk for fracture should have regular bone mineral density tests. For patients eligible for Medicare, routine testing is allowed once every 2 years. The testing frequency can be increased to one year for patients who have rapidly progressing disease, those who are receiving or discontinuing medical therapy to restore bone mass, or have additional risk factors. I have reviewed this report, and agree with the above findings. [facility] Radiology Electronically Signed By: David Martinique M.D. On: 08/16/2015 08:31     Review of Systems  Constitutional: Negative.  HENT: Negative.  Respiratory: Negative.  Cardiovascular: Negative.    Gastrointestinal: Negative.  Skin: Negative.  Neurological: Negative.    BP 134/77 mmHg  Pulse 80  Temp(Src) 97.7 F (36.5 C) (Oral)  Resp 20  Ht 5' (1.524 m)  Wt 65.318 kg (144 lb)  BMI 28.12 kg/m2  SpO2 96%   Physical Exam  Constitutional: She is oriented to person, place, and time. She appears well-developed and well-nourished.  HENT:  Head: Normocephalic and atraumatic.  Eyes: Conjunctivae and EOM are normal. Pupils are equal, round, and reactive to light.  Neck: Normal range of motion. Neck supple.  Cardiovascular: Normal rate and normal heart sounds.  Respiratory: Effort normal and breath sounds normal.  GI: Soft. Bowel sounds are normal.  Musculoskeletal: Normal range of motion.  Neurological: She is alert and oriented to person, place, and time.  Skin: Skin is warm and dry.     6X 5 cm, subcutaneous, mobile     Assessment/Plan 64 year old female with left back lipoma  1. The patient will like to proceed to the operating room for excision of lipoma 2. I discusseith her the risks and benefits the procedure to include but not limited to: Infection, bleeding, damages to surrounding structures, and possible recurrence. The patient voices understanding and wishes to proceed.

## 2015-09-21 NOTE — Discharge Instructions (Signed)
Incision Care An incision is when a surgeon cuts into your body. After surgery, the incision needs to be cared for properly to prevent infection.  HOW TO CARE FOR YOUR INCISION  Take medicines only as directed by your health care provider.  There are many different ways to close and cover an incision, including stitches, skin glue, and adhesive strips. Follow your health care provider's instructions on:  Incision care.  Bandage (dressing) changes and removal.  Incision closure removal.  Resume your normal diet and activities as directed.  Use anti-itch medicine (such as an antihistamine) as directed by your health care provider. The incision may itch while it is healing. Do not pick or scratch at the incision.  Drink enough fluid to keep your urine clear or pale yellow. SEEK MEDICAL CARE IF:   You have drainage, redness, swelling, or pain at your incision site.  You have muscle aches, chills, or a general ill feeling.  You notice a bad smell coming from the incision or dressing.  Your incision edges separate after the sutures, staples, or skin adhesive strips have been removed.  You have persistent nausea or vomiting.  You have a fever.  You are dizzy. SEEK IMMEDIATE MEDICAL CARE IF:   You have a rash.  You faint.  You have difficulty breathing. MAKE SURE YOU:   Understand these instructions.  Will watch your condition.  Will get help right away if you are not doing well or get worse.   This information is not intended to replace advice given to you by your health care provider. Make sure you discuss any questions you have with your health care provider.   Document Released: 04/11/2005 Document Revised: 10/13/2014 Document Reviewed: 11/16/2013 Elsevier Interactive Patient Education 2016 [place]                                                                                          General Anesthesia, Adult, Care After Refer to this sheet in the next few  weeks. These instructions provide you with information on caring for yourself after your procedure. Your health care provider may also give you more specific instructions. Your treatment has been planned according to current medical practices, but problems sometimes occur. Call your health care provider if you have any problems or questions after your procedure. WHAT TO EXPECT AFTER THE PROCEDURE After the procedure, it is typical to experience:  Sleepiness.  Nausea and vomiting. HOME CARE INSTRUCTIONS  For the first 24 hours after general anesthesia:  Have a responsible person with you.  Do not drive a car. If you are alone, do not take public transportation.  Do not drink alcohol.  Do not take medicine that has not been prescribed by your health care provider.  Do not sign important papers or make important decisions.  You may resume a normal diet and activities as directed by your health care provider.  Change bandages (dressings) as directed.  If you have questions or problems that seem related to general anesthesia, call the hospital and ask for the anesthetist or anesthesiologist on call. SEEK MEDICAL CARE IF:  You have nausea and vomiting  that continue the day after anesthesia.  You develop a rash. SEEK IMMEDIATE MEDICAL CARE IF:   You have difficulty breathing.  You have chest pain.  You have any allergic problems.   This information is not intended to replace advice given to you by your health care provider. Make sure you discuss any questions you have with your health care provider.   Document Released: 12/29/2000 Document Revised: 10/13/2014 Document Reviewed: 01/21/2012 Elsevier Interactive Patient Education 2016 [place] Anesthesia, Adult, Care After Refer to this sheet in the next few weeks. These instructions provide you with information on caring for yourself after your procedure. Your health care provider may also give you more specific  instructions. Your treatment has been planned according to current medical practices, but problems sometimes occur. Call your health care provider if you have any problems or questions after your procedure. WHAT TO EXPECT AFTER THE PROCEDURE After the procedure, it is typical to experience:  Sleepiness.  Nausea and vomiting. HOME CARE INSTRUCTIONS  For the first 24 hours after general anesthesia:  Have a responsible person with you.  Do not drive a car. If you are alone, do not take public transportation.  Do not drink alcohol.  Do not take medicine that has not been prescribed by your health care provider.  Do not sign important papers or make important decisions.  You may resume a normal diet and activities as directed by your health care provider.  Change bandages (dressings) as directed.  If you have questions or problems that seem related to general anesthesia, call the hospital and ask for the anesthetist or anesthesiologist on call. SEEK MEDICAL CARE IF:  You have nausea and vomiting that continue the day after anesthesia.  You develop a rash. SEEK IMMEDIATE MEDICAL CARE IF:   You have difficulty breathing.  You have chest pain.  You have any allergic problems.   This information is not intended to replace advice given to you by your health care provider. Make sure you discuss any questions you have with your health care provider.   Document Released: 12/29/2000 Document Revised: 10/13/2014 Document Reviewed: 01/21/2012 Elsevier Interactive Patient Education [employer].

## 2015-09-21 NOTE — Transfer of Care (Signed)
Immediate Anesthesia Transfer of Care Note  Patient: Meghan Avila  Procedure(s) Performed: Procedure(s): EXCISION 6CM BACK LIPOMA (N/A)  Patient Location: PACU  Anesthesia Type:General  Level of Consciousness: sedated  Airway & Oxygen Therapy: Patient Spontanous Breathing and Patient connected to face mask oxygen  Post-op Assessment: Report given to RN and Post -op Vital signs reviewed and stable  Post vital signs: Reviewed and stable  Last Vitals:  Filed Vitals:   09/21/15 0732  BP: 134/77  Pulse: 80  Temp: 36.5 C  Resp: 20    Complications: No apparent anesthesia complications

## 2016-04-17 ENCOUNTER — Other Ambulatory Visit: Payer: Self-pay | Admitting: Gastroenterology

## 2016-04-17 DIAGNOSIS — R1013 Epigastric pain: Secondary | ICD-10-CM

## 2016-04-25 ENCOUNTER — Other Ambulatory Visit: Payer: 59

## 2016-04-25 ENCOUNTER — Ambulatory Visit
Admission: RE | Admit: 2016-04-25 | Discharge: 2016-04-25 | Disposition: A | Discharge status: Home / Self Care | Payer: 59 | Source: Ambulatory Visit | Attending: Gastroenterology | Admitting: Gastroenterology

## 2016-04-25 DIAGNOSIS — R1013 Epigastric pain: Secondary | ICD-10-CM

## 2016-05-08 ENCOUNTER — Other Ambulatory Visit: Payer: Self-pay | Admitting: Gastroenterology

## 2016-05-08 DIAGNOSIS — R1013 Epigastric pain: Secondary | ICD-10-CM

## 2016-05-14 ENCOUNTER — Ambulatory Visit
Admission: RE | Admit: 2016-05-14 | Discharge: 2016-05-14 | Disposition: A | Discharge status: Home / Self Care | Payer: 59 | Source: Ambulatory Visit | Attending: Gastroenterology | Admitting: Gastroenterology

## 2016-05-14 DIAGNOSIS — R1013 Epigastric pain: Secondary | ICD-10-CM

## 2016-05-14 MED ORDER — IOPAMIDOL (ISOVUE-300) INJECTION 61%
100.0000 mL | Freq: Once | INTRAVENOUS | Status: AC | PRN
  Administered 2016-05-14: 100 mL via INTRAVENOUS

## 2016-06-13 ENCOUNTER — Ambulatory Visit (HOSPITAL_COMMUNITY)
Admission: RE | Admit: 2016-06-13 | Discharge: 2016-06-13 | Disposition: A | Discharge status: Home / Self Care | Payer: 59 | Source: Ambulatory Visit | Attending: Family Medicine | Admitting: Family Medicine

## 2016-07-04 ENCOUNTER — Other Ambulatory Visit: Payer: Self-pay | Admitting: Family Medicine

## 2016-07-04 DIAGNOSIS — Z1231 Encounter for screening mammogram for malignant neoplasm of breast: Secondary | ICD-10-CM

## 2016-07-04 DIAGNOSIS — Z9889 Other specified postprocedural states: Secondary | ICD-10-CM

## 2016-07-10 ENCOUNTER — Ambulatory Visit
Admission: RE | Admit: 2016-07-10 | Discharge: 2016-07-10 | Disposition: A | Discharge status: Home / Self Care | Payer: 59 | Source: Ambulatory Visit | Attending: Family Medicine | Admitting: Family Medicine

## 2016-07-10 DIAGNOSIS — Z1231 Encounter for screening mammogram for malignant neoplasm of breast: Secondary | ICD-10-CM

## 2016-07-10 DIAGNOSIS — Z9889 Other specified postprocedural states: Secondary | ICD-10-CM

## 2016-09-04 ENCOUNTER — Ambulatory Visit (HOSPITAL_COMMUNITY)
Admission: RE | Admit: 2016-09-04 | Discharge: 2016-09-04 | Disposition: A | Discharge status: Home / Self Care | Payer: 59 | Source: Ambulatory Visit | Attending: Family Medicine | Admitting: Family Medicine

## 2016-09-04 ENCOUNTER — Encounter (HOSPITAL_COMMUNITY): Payer: Self-pay

## 2016-09-04 DIAGNOSIS — M179 Osteoarthritis of knee, unspecified: Secondary | ICD-10-CM | POA: Insufficient documentation

## 2016-09-04 MED ORDER — SODIUM CHLORIDE 0.9 % IV SOLN
Freq: Once | INTRAVENOUS | Status: AC
  Administered 2016-09-04: 12:00:00 via INTRAVENOUS

## 2016-09-04 MED ORDER — ZOLEDRONIC ACID 5 MG/100ML IV SOLN
5.0000 mg | Freq: Once | INTRAVENOUS | Status: AC
  Administered 2016-09-04: 5 mg via INTRAVENOUS
  Filled 2016-09-04: qty 100

## 2016-09-04 NOTE — Discharge Instructions (Signed)
Zoledronic Acid injection (Paget's Disease, Osteoporosis) °What is this medicine? °ZOLEDRONIC ACID (ZOE le dron ik AS id) lowers the amount of calcium loss from bone. It is used to treat Paget's disease and osteoporosis in women. °COMMON BRAND NAME(S): Reclast, Zometa °What should I tell my health care provider before I take this medicine? °They need to know if you have any of these conditions: °-aspirin-sensitive asthma °-cancer, especially if you are receiving medicines used to treat cancer °-dental disease or wear dentures °-infection °-kidney disease °-low levels of calcium in the blood °-past surgery on the parathyroid gland or intestines °-receiving corticosteroids like dexamethasone or prednisone °-an unusual or allergic reaction to zoledronic acid, other medicines, foods, dyes, or preservatives °-pregnant or trying to get pregnant °-breast-feeding °How should I use this medicine? °This medicine is for infusion into a vein. It is given by a health care professional in a hospital or clinic setting. °Talk to your pediatrician regarding the use of this medicine in children. This medicine is not approved for use in children. °What if I miss a dose? °It is important not to miss your dose. Call your doctor or health care professional if you are unable to keep an appointment. °What may interact with this medicine? °-certain antibiotics given by injection °-NSAIDs, medicines for pain and inflammation, like ibuprofen or naproxen °-some diuretics like bumetanide, furosemide °-teriparatide °What should I watch for while using this medicine? °Visit your doctor or health care professional for regular checkups. It may be some time before you see the benefit from this medicine. Do not stop taking your medicine unless your doctor tells you to. Your doctor may order blood tests or other tests to see how you are doing. °Women should inform their doctor if they wish to become pregnant or think they might be pregnant. There is a  potential for serious side effects to an unborn child. Talk to your health care professional or pharmacist for more information. °You should make sure that you get enough calcium and vitamin D while you are taking this medicine. Discuss the foods you eat and the vitamins you take with your health care professional. °Some people who take this medicine have severe bone, joint, and/or muscle pain. This medicine may also increase your risk for jaw problems or a broken thigh bone. Tell your doctor right away if you have severe pain in your jaw, bones, joints, or muscles. Tell your doctor if you have any pain that does not go away or that gets worse. °Tell your dentist and dental surgeon that you are taking this medicine. You should not have major dental surgery while on this medicine. See your dentist to have a dental exam and fix any dental problems before starting this medicine. Take good care of your teeth while on this medicine. Make sure you see your dentist for regular follow-up appointments. °What side effects may I notice from receiving this medicine? °Side effects that you should report to your doctor or health care professional as soon as possible: °-allergic reactions like skin rash, itching or hives, swelling of the face, lips, or tongue °-anxiety, confusion, or depression °-breathing problems °-changes in vision °-eye pain °-feeling faint or lightheaded, falls °-jaw pain, especially after dental work °-mouth sores °-muscle cramps, stiffness, or weakness °-redness, blistering, peeling or loosening of the skin, including inside the mouth °-trouble passing urine or change in the amount of urine °Side effects that usually do not require medical attention (report to your doctor or health care professional if   they continue or are bothersome): °-bone, joint, or muscle pain °-constipation °-diarrhea °-fever °-hair loss °-irritation at site where injected °-loss of appetite °-nausea, vomiting °-stomach  upset °-trouble sleeping °-trouble swallowing °-weak or tired °Where should I keep my medicine? °This drug is given in a hospital or clinic and will not be stored at home. °© 2017 Elsevier/Gold Standard (2014-02-18 14:19:57) ° °

## 2016-09-04 NOTE — Progress Notes (Signed)
reclast given.  Pt tolerated well.  D/c instructions for reclast given to pt.  Pt states she takes daily vitamin D but not calcium.  Pt's calcium level is WNL.  Pt was d/c ambulatory to lobby.

## 2016-09-09 ENCOUNTER — Ambulatory Visit (INDEPENDENT_AMBULATORY_CARE_PROVIDER_SITE_OTHER): Payer: 59 | Admitting: Internal Medicine

## 2016-09-09 ENCOUNTER — Other Ambulatory Visit: Payer: Self-pay | Admitting: Internal Medicine

## 2016-09-09 ENCOUNTER — Encounter: Payer: Self-pay | Admitting: Internal Medicine

## 2016-09-09 VITALS — BP 114/72 | HR 77 | Ht 60.0 in | Wt 151.4 lb

## 2016-09-09 DIAGNOSIS — R05 Cough: Secondary | ICD-10-CM

## 2016-09-09 DIAGNOSIS — R058 Other specified cough: Secondary | ICD-10-CM

## 2016-09-09 MED ORDER — MEPERIDINE HCL 50 MG PO TABS: 50.0000 mg | ORAL_TABLET | ORAL | 0 refills | Status: DC | PRN

## 2016-09-09 NOTE — Progress Notes (Signed)
Subjective:    Patient ID: Meghan Avila, female    DOB: 05/16/1951      MRN: [id number]    Brief patient profile:  65 ywof quit smoking 1980 at first pregnancy with new onset sob mid April of 2016 then started coughing and referred for pulmonary eval 03/13/2015 by Dr Kathyrn Lass   History of Present Illness  03/13/2015 1st [facility] office visit/ Meghan Avila   Chief Complaint  Patient presents with  . Pulmonary Consult    Referred by Dr. Kathyrn Lass. Pt c/o SOB for the past 6 wks "kind of all of the time".  She also states doing alot of coughing- non prod.   pt sp L knee arthroscopy feb 2 2016[place] [facility] by Dr Theda Sers and then opened it up March 15 office the mid April onset of sob when bending over mainly and some doe and cough but really can be sob at rest as well s pattern x not noct unless coughing sleeps at 30 degrees  Cough is 24/7 and does wake her up / has midline cp immediately p eating better with tums Chronic nasal sniffles x years / does have mold exp at home/ neg venous dopplers 12/06/14  rec Try prilosec 20mg   Take 30-60 min before first meal of the day and Pepcid (famotidine) 20 mg one bedtime until cough is completely gone for at least a week without the need for cough suppression GERD  Diet   03/29/2015 f/u ov/Meghan Avila re: atypical cp/ sob not reproduced with exertion  Chief Complaint  Patient presents with  . Follow-up    Cough and SOB are both unchanged. No new co's today. Still having midline CP after eating and has to vomit after every meal.   symtoms are now entirely related to meals    - had appt with DR Mellody Memos but "too sick to keep it" and rescheduled for Jul 09, 2015  rec Pantoprazole (protonix) 40 mg   Take  30-60 min before first meal of the day and Pepcid (famotidine)  20 mg one @  Bedtime  GERD diet     09/10/2016 acute extended ov/Meghan Avila re:  Recurrent uacs  Chief Complaint  Patient presents with  . Acute Visit    coughing and wheezing for 2 weeks. Was seen  by PCP during 1st week of Nov, not feeling any better.    100% better p NF in terms of cough and sob and off all acid suppression then last week in Oct 2017 ? URI >> severe cough assoc with loud upper airway wheezing can be heard across the room  Was on fosfamax but stopped well before onset of cough / no gerd rx since nf and not aware of any HB  Went to [facility] early Nov and Dec > cxr ok/ rx amox / cough drops Dry cough persists 24/7 but much worse at hs along with sensation of pnds but no excess/ purulent sputum or mucus plugs  No obvious day to day or daytime variability or assoc sob  Unless coughing or cp or chest tightness,  or overt sinus or hb symptoms. No unusual exp hx or h/o childhood pna/ asthma or knowledge of premature birth.  Sleeping ok without nocturnal  or early am exacerbation  of respiratory  c/o's or need for noct saba. Also denies any obvious fluctuation of symptoms with weather or environmental changes or other aggravating or alleviating factors except as outlined above   Current Medications, Allergies, Complete Past Medical  History, Past Surgical History, Family History, and Social History were reviewed in [employer] record.  ROS  The following are not active complaints unless bolded sore throat, dysphagia, dental problems, itching, sneezing,  nasal congestion or excess/ purulent secretions, ear ache,   fever, chills, sweats, unintended wt loss, classically pleuritic or exertional cp, hemoptysis,  orthopnea pnd or leg swelling, presyncope, palpitations, abdominal pain, anorexia, nausea, vomiting, diarrhea  or change in bowel or bladder habits, change in stools or urine, dysuria,hematuria,  rash, arthralgias, visual complaints, headache, numbness, weakness or ataxia or problems with walking or coordination,  change in mood/affect or memory.              Objective:   Physical Exam  amb wf very harsh upper airway cough and classic pseudowheeze    09/09/2016        151  03/29/2015        155     03/13/15 157 lb (71.215 kg)  08/01/14 152 lb (68.947 kg)  07/21/14 154 lb 9.6 oz (70.126 kg)    Vital signs reviewed - Note on arrival 02 sats  97% on RA    amb wf nad    HEENT: nl dentition, turbinates, and orophanx. Nl external ear canals without cough reflex   NECK :  without JVD/Nodes/TM/ nl carotid upstrokes bilaterally   LUNGS: no acc muscle use, clear to A and P bilaterally without cough on insp or exp maneuvers   CV:  RRR  no s3 or murmur or increase in P2, no edema   ABD:  soft and nontender with nl excursion in the supine position. No bruits or organomegaly, bowel sounds nl  MS:  warm without deformities, calf tenderness, cyanosis or clubbing  SKIN: warm and dry without lesions    NEURO:  alert, approp, no deficits      I personally reviewed images and agree with radiology impression as follows:  CXR:   09/02/16 No acute changes              Assessment & Plan:

## 2016-09-09 NOTE — Patient Instructions (Addendum)
Take delsym two tsp every 12 hours and supplement if needed with  Demerol 50 mg up to one half to 1 every 4 hours to suppress the urge to cough. Swallowing water or using ice chips/non mint and menthol containing candies (such as lifesavers or sugarless jolly ranchers) are also effective.  You should rest your voice and avoid activities that you know make you cough.  Once you have eliminated the cough for 3 straight days try reducing the Demerol first,  then the delsym as tolerated.   Try prilosec otc 20mg   Take 30-60 min before first meal of the day and Pepcid ac (famotidine) 20 mg one @  bedtime until cough is completely gone for at least a week without the need for cough suppression   GERD (REFLUX)  is an extremely common cause of respiratory symptoms just like yours , many times with no obvious heartburn at all.    It can be treated with medication, but also with lifestyle changes including elevation of the head of your bed (ideally with 6 inch  bed blocks),  Smoking cessation, avoidance of late meals, excessive alcohol, and avoid fatty foods, chocolate, peppermint, colas, red wine, and acidic juices such as orange juice.  NO MINT OR MENTHOL PRODUCTS SO NO COUGH DROPS   USE SUGARLESS CANDY INSTEAD (Jolley ranchers or Stover's or Life Savers) or even ice chips will also do - the key is to swallow to prevent all throat clearing. NO OIL BASED VITAMINS - use powdered substitutes.  If not better will need to return before surgery with all medications in hand

## 2016-09-10 ENCOUNTER — Encounter: Payer: Self-pay | Admitting: Internal Medicine

## 2016-09-10 NOTE — Assessment & Plan Note (Signed)
Max gerd rx 03/14/2015 >>>  EGD 04/19/15 Assessment: Moderate sized hiatal hernia. Otherwise normal esophagogastroduodenoscopy. Recommendation: Schedule barium upper GI x-ray series followed by diagnostic high resolution esophageal manometry - 05/15/2015 Normal upper esophageal sphincter function #2. Normal lower esophageal sphincter function including a basal pressure 28.7 mmHg with normal relaxation. #3. Normal esophageal body peristaltic contractions based on 10 wet swallows. Assessment: Normal high resolution esophageal manometry Recommendation: Schedule consultation with surgery to treat her large, symptomatic paraesophageal hernia >>>   NF done 06/29/15 - recurrence after URI [id number] > cyclical cough rx [id number] >>>   Upper airway cough syndrome (previously labeled PNDS) , is  so named because it's frequently impossible to sort out how much is  CR/sinusitis with freq throat clearing (which can be related to primary GERD)   vs  causing  secondary (" extra esophageal")  GERD from wide swings in gastric pressure that occur with throat clearing, often  promoting self use of mint and menthol lozenges that reduce the lower esophageal sphincter tone and exacerbate the problem further in a cyclical fashion.   These are the same pts (now being labeled as having "irritable larynx syndrome" by some cough centers) who not infrequently have a history of having failed to tolerate ace inhibitors,  dry powder inhalers or biphosphonates or report having atypical/extraesophageal reflux symptoms that don't respond to standard doses of PPI  and are easily confused as having aecopd or asthma flares by even experienced allergists/ pulmonologists (myself included).   Of the three most common causes of chronic cough, only one (GERD)  can actually cause the other two (asthma and post nasal drip syndrome)  and perpetuate the cylce of cough inducing airway trauma, inflammation, heightened sensitivity to reflux which is  prompted by the cough itself via a cyclical mechanism.    This may partially respond to steroids and look like asthma and post nasal drainage but never erradicated completely unless the cough and the secondary reflux are eliminated, preferably both at the same time.  While not intuitively obvious, many patients with chronic low grade reflux do not cough until there is a secondary insult that disturbs the protective epithelial barrier and exposes sensitive nerve endings.  This can be viral as was likely the case here or direct physical injury such as with an endotracheal tube.   The point is that once this occurs, it is difficult to eliminate using anything but a maximally effective acid suppression regimen at least in the short run, accompanied by an appropriate diet to address non acid GERD.   rec max rx for gerd/ cyclical cough with demerol and add flutter valve next if continues/ may even need to consider neurontin trial here as very severe (vs refer to [facility] / Dr Joya G[facility])  I had an extended discussion with the patient reviewing all relevant studies completed to date and  lasting 25 minutes of a 40  minute acute office  visit to reestablish  re  non-specific but potentially very serious pulmonary symptoms of unknown etiology.  Each maintenance medication was reviewed in detail including most importantly the difference between maintenance and prns and under what circumstances the prns are to be triggered using an action plan format that is not reflected in the computer generated alphabetically organized AVS.    Please see AVS for unique instructions that I personally wrote and verbalized to the the pt in detail and then reviewed with pt  by my nurse highlighting any  changes in therapy recommended  at today's visit to their plan of care.

## 2016-09-18 ENCOUNTER — Ambulatory Visit (INDEPENDENT_AMBULATORY_CARE_PROVIDER_SITE_OTHER): Payer: 59 | Admitting: Acute Care

## 2016-09-18 DIAGNOSIS — R05 Cough: Secondary | ICD-10-CM | POA: Diagnosis not present

## 2016-09-18 DIAGNOSIS — R058 Other specified cough: Secondary | ICD-10-CM

## 2016-09-18 MED ORDER — GABAPENTIN 100 MG PO CAPS: 100.0000 mg | ORAL_CAPSULE | Freq: Three times a day (TID) | ORAL | 2 refills | Status: DC

## 2016-09-18 NOTE — Assessment & Plan Note (Signed)
Continued cough despite interventions for GERD and treatment with Demerol  Pt. Unable to use Demerol during the day as she works. Didn't start Demerol until 2 days ago Plan: We will start Neurontin  100 mg TID with food. Continue using Delsym daily Supplement with  Demerol as needed for continued cough at night. Don't drive if sleepy. Follow up with your surgeon and let him know of your ongoing cough issues. Follow up with Dr. Melvyn Novas or NP  in 2 weeks  Please contact office for sooner follow up if symptoms do not improve or worsen or seek emergency care

## 2016-09-18 NOTE — Progress Notes (Signed)
Chart and office note reviewed in detail  > agree with a/p as outlined    

## 2016-09-18 NOTE — Progress Notes (Signed)
History of Present Illness Meghan Avila is a 65 y.o. female with chronic cough followed by Dr. Melvyn Novas  Brief patient profile:  89 ywof quit smoking 1980 at first pregnancy with new onset sob mid April of 2016 then started coughing and referred for pulmonary eval 03/13/2015 by Dr Kathyrn Lass   09/18/2016 Acute OV: Pt. Presents to the office for follow up of cough. She was last seen in the office 09/09/2016 by Dr. Melvyn Novas. At that time he initiated maximum GERD RX, treatment of post nasal drip syndrome. There is no evidence of airway trauma. Per Dr. Gustavus Bryant note he feels this is of viral nature. She returns today stating that her cough is no better. She feels this is not her throat, but she feels this is her lungs. She had a CXR done at [facility] 11/28//2017 that patient reports as clear.( We do not have access to films from [place]) She is having knee surgery 09/23/2016) by  Dr. Theda Sers. She is anxious to have this cleared up prior.She took the Demerol for the first time for the last 2 nights, and has slept very well with that.She is unable to take the Demerol during the day as she works.She has been taking the Delsym as prescribed. She felt that the Delsym was initially working but over the last 3 days the cough has returned. She has not used a flutter valve.She did start a [employer], but states she has not used it since 09/08/2016.I spoke with Dr. Melvyn Novas. Plan is to add Gabapentin 100 mg TID with food, continue Delsym, and supplement with Demerol as needed. I have asked her to update her surgeon regarding her continued cough issues. She verbalized that she would do so. She did play a recording of her breathing at night, which sounded like upper airway irritation.She denies fever, chest pain, orthopnea or hemoptysis. She has not had a any recent air or automobile travel.She is in NAD in the office today, no wheeze and minimal coughing. .    Past medical hx Past Medical History:  Diagnosis Date  .  Arthritis   . Family history of adverse reaction to anesthesia    post nausea and vomitting  . GERD (gastroesophageal reflux disease)   . H/O wheezing    occ.  Marland Kitchen Hand swelling    related to lymph nodes   . Headache    migraines  . History of hiatal hernia   . Leg swelling    left leg -edema knee level to ankle.  . Osteoporosis   . Peripheral vascular disease (HCC)    left leg   . PONV (postoperative nausea and vomiting)   . Scoliosis      Past surgical hx, Family hx, Social hx all reviewed.  Current Outpatient Prescriptions on File Prior to Visit  Medication Sig  . acetaminophen (TYLENOL) 500 MG tablet Take 1,000 mg by mouth every 6 (six) hours as needed for mild pain, moderate pain or fever.  Marland Kitchen aspirin-acetaminophen-caffeine (EXCEDRIN MIGRAINE) 250-250-65 MG per tablet Take 1 tablet by mouth daily as needed for headache or migraine.   Marland Kitchen ibuprofen (ADVIL,MOTRIN) 200 MG tablet Take 200 mg by mouth every 6 (six) hours as needed for mild pain or moderate pain.  . meperidine (DEMEROL) 50 MG tablet Take 1 tablet (50 mg total) by mouth every 4 (four) hours as needed (cough not controlled on delsym).  . Vitamin D, Ergocalciferol, (DRISDOL) 50000 UNITS CAPS capsule Take 50,000 Units by mouth every 7 (  seven) days. Saturday   No current facility-administered medications on file prior to visit.      Allergies  Allergen Reactions  . Zantac [Ranitidine Hcl] Anaphylaxis  . Morphine And Related Nausea And Vomiting    Morphine, Hydrocodone Tolerates Dilaudid, Fentanyl per Epic  . Prednisone Diarrhea and Nausea And Vomiting  . Tramadol Nausea And Vomiting    AMS    Review Of Systems:  Constitutional:   No  weight loss, night sweats,  Fevers, chills, fatigue, or  lassitude.  HEENT:   No headaches,  Difficulty swallowing,  Tooth/dental problems, or  Sore throat,                No sneezing, itching, ear ache, nasal congestion, post nasal drip,   CV:  No chest pain,  Orthopnea, PND,  swelling in lower extremities, anasarca, dizziness, palpitations, syncope.   GI  No heartburn, indigestion, abdominal pain, nausea, vomiting, diarrhea, change in bowel habits, loss of appetite, bloody stools.   Resp: No shortness of breath with exertion or at rest.  No excess mucus, no productive cough,  + non-productive cough,  No coughing up of blood.  No change in color of mucus.  + wheezing.  No chest wall deformity  Skin: no rash or lesions.  GU: no dysuria, change in color of urine, no urgency or frequency.  No flank pain, no hematuria   MS:  No joint pain or swelling.  No decreased range of motion.  No back pain.  Psych:  No change in mood or affect. No depression or anxiety.  No memory loss.   Vital Signs BP 120/78 (BP Location: Left Arm, Patient Position: Sitting, Cuff Size: Normal)   Pulse 69   Temp 97.4 F (36.3 C) (Oral)   Ht 5' (1.524 m)   Wt 150 lb 8 oz (68.3 kg)   SpO2 96%   BMI 29.39 kg/m    Physical Exam:  General- No distress,  A&Ox3, pleasant ENT: No sinus tenderness, TM clear, pale nasal mucosa, no oral exudate,no post nasal drip, no LAN Cardiac: S1, S2, regular rate and rhythm, no murmur Chest: No wheeze/ rales/ dullness; no accessory muscle use, no nasal flaring, no sternal retractions Abd.: Soft Non-tender Ext: No clubbing cyanosis, edema Neuro:  normal strength Skin: No rashes, warm and dry Psych: normal mood and behavior   Assessment/Plan  Upper airway cough syndrome Continued cough despite interventions for GERD and treatment with Demerol  Pt. Unable to use Demerol during the day as she works. Didn't start Demerol until 2 days ago Plan: We will start Neurontin  100 mg TID with food. Continue using Delsym daily Supplement with  Demerol as needed for continued cough at night. Don't drive if sleepy. Follow up with your surgeon and let him know of your ongoing cough issues. Follow up with Dr. Melvyn Novas or NP  in 2 weeks  Please contact office for  sooner follow up if symptoms do not improve or worsen or seek emergency care       Magdalen Spatz, NP 09/18/2016  2:22 PM

## 2016-09-18 NOTE — Patient Instructions (Addendum)
It is nice to meet you. We will start Neurontin  100 mg TID with food. Continue using Delsym daily Supplement with  Demerol as needed for continued cough at night. Don't drive if sleepy. Follow up with your surgeon and let him know of your ongoing cough issues. Follow up with Dr. Melvyn Novas or NP  in 2 weeks  Please contact office for sooner follow up if symptoms do not improve or worsen or seek emergency care

## 2016-09-26 ENCOUNTER — Telehealth: Payer: Self-pay | Admitting: Internal Medicine

## 2016-09-26 NOTE — Telephone Encounter (Signed)
Demerol is the strongest med and will stop the cough if she takes it but needs to commit to it and accept it's going to make her sleepy - if not better add on to my ov 12/27 with all meds in hand

## 2016-09-26 NOTE — Telephone Encounter (Signed)
Spoke with pt. She is aware of MW's recommendations. Nothing further was needed. 

## 2016-09-26 NOTE — Telephone Encounter (Signed)
MW  Please Advise- Sick Message  Pt. Called today stating that her coughing has not been getting better. She saw SG on 09/18/16. She said she is wheezing. Denies fever,sob. She is requesting a cxr.  Below are SG instructions when she saw the pt.    6. Magdalen Spatz, NP (Nurse Practitioner) at 09/18/2016 11:02 AM - Signed    It is nice to meet you. We will start Neurontin  100 mg TID with food. Continue using Delsym daily Supplement with  Demerol as needed for continued cough at night. Don't drive if sleepy. Follow up with your surgeon and let him know of your ongoing cough issues. Follow up with Dr. Melvyn Novas or NP  in 2 weeks  Please contact office for sooner follow up if symptoms do not improve or worsen or seek emergency care

## 2016-12-18 ENCOUNTER — Ambulatory Visit (HOSPITAL_COMMUNITY)
Admission: RE | Admit: 2016-12-18 | Discharge: 2016-12-18 | Disposition: A | Discharge status: Home / Self Care | Payer: 59 | Source: Ambulatory Visit | Attending: Specialist | Admitting: Specialist

## 2016-12-18 ENCOUNTER — Other Ambulatory Visit (HOSPITAL_COMMUNITY): Payer: Self-pay | Admitting: Specialist

## 2016-12-18 DIAGNOSIS — M7989 Other specified soft tissue disorders: Principal | ICD-10-CM

## 2016-12-18 DIAGNOSIS — M96842 Postprocedural seroma of a musculoskeletal structure following a musculoskeletal system procedure: Secondary | ICD-10-CM | POA: Insufficient documentation

## 2016-12-18 DIAGNOSIS — M79669 Pain in unspecified lower leg: Secondary | ICD-10-CM

## 2016-12-18 NOTE — Progress Notes (Signed)
**  Preliminary report by tech**  Bilateral lower extremity venous duplex completed. There is no evidence of deep or superficial vein thrombosis involving the right and left lower extremities. All visualized vessels appear patent and compressible. There is no evidence of Baker's cysts bilaterally. Results were given to Dr. Theda Sers' nurse, Keri.  12/18/16 11:35 AM Carlos Levering RVT

## 2017-06-16 ENCOUNTER — Other Ambulatory Visit: Payer: Self-pay | Admitting: Family Medicine

## 2017-06-16 DIAGNOSIS — Z1231 Encounter for screening mammogram for malignant neoplasm of breast: Secondary | ICD-10-CM

## 2017-07-15 ENCOUNTER — Other Ambulatory Visit: Payer: Self-pay | Admitting: Family Medicine

## 2017-07-15 DIAGNOSIS — N63 Unspecified lump in unspecified breast: Secondary | ICD-10-CM

## 2017-07-16 ENCOUNTER — Ambulatory Visit
Admission: RE | Admit: 2017-07-16 | Discharge: 2017-07-16 | Disposition: A | Discharge status: Home / Self Care | Payer: 59 | Source: Ambulatory Visit | Attending: Family Medicine | Admitting: Family Medicine

## 2017-07-16 ENCOUNTER — Ambulatory Visit: Payer: 59

## 2017-07-16 DIAGNOSIS — N63 Unspecified lump in unspecified breast: Secondary | ICD-10-CM

## 2017-09-02 ENCOUNTER — Other Ambulatory Visit: Payer: Self-pay | Admitting: Family Medicine

## 2017-09-02 DIAGNOSIS — M810 Age-related osteoporosis without current pathological fracture: Secondary | ICD-10-CM

## 2017-09-18 ENCOUNTER — Ambulatory Visit
Admission: RE | Admit: 2017-09-18 | Discharge: 2017-09-18 | Disposition: A | Discharge status: Home / Self Care | Payer: 59 | Source: Ambulatory Visit | Attending: Family Medicine | Admitting: Family Medicine

## 2017-09-18 DIAGNOSIS — M810 Age-related osteoporosis without current pathological fracture: Secondary | ICD-10-CM

## 2017-09-24 ENCOUNTER — Encounter (HOSPITAL_COMMUNITY): Payer: Self-pay

## 2017-09-24 ENCOUNTER — Ambulatory Visit (HOSPITAL_COMMUNITY)
Admission: RE | Admit: 2017-09-24 | Discharge: 2017-09-24 | Disposition: A | Discharge status: Home / Self Care | Payer: 59 | Source: Ambulatory Visit | Attending: Family Medicine | Admitting: Family Medicine

## 2017-09-24 DIAGNOSIS — M810 Age-related osteoporosis without current pathological fracture: Secondary | ICD-10-CM | POA: Diagnosis not present

## 2017-09-24 MED ORDER — SODIUM CHLORIDE 0.9 % IV SOLN
INTRAVENOUS | Status: DC
Start: 2017-09-24 — End: 2017-09-25
  Administered 2017-09-24: 11:00:00 via INTRAVENOUS

## 2017-09-24 MED ORDER — ZOLEDRONIC ACID 5 MG/100ML IV SOLN
5.0000 mg | Freq: Once | INTRAVENOUS | Status: AC
  Administered 2017-09-24: 5 mg via INTRAVENOUS
  Filled 2017-09-24: qty 100

## 2017-09-24 NOTE — Discharge Instructions (Signed)
Zoledronic Acid injection (Paget's Disease, Osteoporosis)/ Reclast °What is this medicine? °ZOLEDRONIC ACID (ZOE le dron ik AS id) lowers the amount of calcium loss from bone. It is used to treat Paget's disease and osteoporosis in women. °This medicine may be used for other purposes; ask your health care provider or pharmacist if you have questions. °COMMON BRAND NAME(S): Reclast, Zometa °What should I tell my health care provider before I take this medicine? °They need to know if you have any of these conditions: °-aspirin-sensitive asthma °-cancer, especially if you are receiving medicines used to treat cancer °-dental disease or wear dentures °-infection °-kidney disease °-low levels of calcium in the blood °-past surgery on the parathyroid gland or intestines °-receiving corticosteroids like dexamethasone or prednisone °-an unusual or allergic reaction to zoledronic acid, other medicines, foods, dyes, or preservatives °-pregnant or trying to get pregnant °-breast-feeding °How should I use this medicine? °This medicine is for infusion into a vein. It is given by a health care professional in a hospital or clinic setting. °Talk to your pediatrician regarding the use of this medicine in children. This medicine is not approved for use in children. °Overdosage: If you think you have taken too much of this medicine contact a poison control center or emergency room at once. °NOTE: This medicine is only for you. Do not share this medicine with others. °What if I miss a dose? °It is important not to miss your dose. Call your doctor or health care professional if you are unable to keep an appointment. °What may interact with this medicine? °-certain antibiotics given by injection °-NSAIDs, medicines for pain and inflammation, like ibuprofen or naproxen °-some diuretics like bumetanide, furosemide °-teriparatide °This list may not describe all possible interactions. Give your health care provider a list of all the  medicines, herbs, non-prescription drugs, or dietary supplements you use. Also tell them if you smoke, drink alcohol, or use illegal drugs. Some items may interact with your medicine. °What should I watch for while using this medicine? °Visit your doctor or health care professional for regular checkups. It may be some time before you see the benefit from this medicine. Do not stop taking your medicine unless your doctor tells you to. Your doctor may order blood tests or other tests to see how you are doing. °Women should inform their doctor if they wish to become pregnant or think they might be pregnant. There is a potential for serious side effects to an unborn child. Talk to your health care professional or pharmacist for more information. °You should make sure that you get enough calcium and vitamin D while you are taking this medicine. Discuss the foods you eat and the vitamins you take with your health care professional. °Some people who take this medicine have severe bone, joint, and/or muscle pain. This medicine may also increase your risk for jaw problems or a broken thigh bone. Tell your doctor right away if you have severe pain in your jaw, bones, joints, or muscles. Tell your doctor if you have any pain that does not go away or that gets worse. °Tell your dentist and dental surgeon that you are taking this medicine. You should not have major dental surgery while on this medicine. See your dentist to have a dental exam and fix any dental problems before starting this medicine. Take good care of your teeth while on this medicine. Make sure you see your dentist for regular follow-up appointments. °What side effects may I notice from receiving this   medicine? °Side effects that you should report to your doctor or health care professional as soon as possible: °-allergic reactions like skin rash, itching or hives, swelling of the face, lips, or tongue °-anxiety, confusion, or depression °-breathing  problems °-changes in vision °-eye pain °-feeling faint or lightheaded, falls °-jaw pain, especially after dental work °-mouth sores °-muscle cramps, stiffness, or weakness °-redness, blistering, peeling or loosening of the skin, including inside the mouth °-trouble passing urine or change in the amount of urine °Side effects that usually do not require medical attention (report to your doctor or health care professional if they continue or are bothersome): °-bone, joint, or muscle pain °-constipation °-diarrhea °-fever °-hair loss °-irritation at site where injected °-loss of appetite °-nausea, vomiting °-stomach upset °-trouble sleeping °-trouble swallowing °-weak or tired °This list may not describe all possible side effects. Call your doctor for medical advice about side effects. You may report side effects to FDA at 1-800-FDA-1088. °Where should I keep my medicine? °This drug is given in a hospital or clinic and will not be stored at home. °NOTE: This sheet is a summary. It may not cover all possible information. If you have questions about this medicine, talk to your doctor, pharmacist, or health care provider. °© 2018 [employer] (2014-02-18 14:19:57) ° °

## 2018-01-25 ENCOUNTER — Ambulatory Visit: Payer: Self-pay | Admitting: Orthopedic Surgery

## 2018-02-12 NOTE — Progress Notes (Signed)
Clearance Marda Stalker PA-C chart ekg 01-12-18 chart cmp 01-12-18 chart

## 2018-02-12 NOTE — Patient Instructions (Addendum)
Meghan Avila  02/12/2018   Your procedure is scheduled on: 02-26-18   Report to [facility] Main  Entrance              Report to admitting at       1:15 PM    Call this number if you have problems the morning of surgery [phone]    Remember: Do not eat food :After Midnight.you may have clear liquids until 0945 am then nothing by mouth  CLEAR LIQUID DIET  Foods Allowed                                                                     Foods Excluded  Coffee and tea, regular and decaf                             liquids that you cannot  Plain Jell-O in any flavor                                             see through such as: Fruit ices (not with fruit pulp)                                     milk, soups, orange juice  Iced Popsicles                                    All solid food Carbonated beverages, regular and diet                                    Cranberry, grape and apple juices Sports drinks like Gatorade Lightly seasoned clear broth or consume(fat free) Sugar, honey syrup   _____________________________________________________________________    Take these medicines the morning of surgery with A SIP OF WATER: lipitor, inhalers and bring                                You may not have any metal on your body including hair pins and              piercings  Do not wear jewelry, make-up, lotions, powders or perfumes, deodorant             Do not wear nail polish.  Do not shave  48 hours prior to surgery.     Do not bring valuables to the hospital. [place].  Contacts, dentures or bridgework may not be worn into surgery.  Leave suitcase in the car. After surgery it may be brought to your room.  Please read over the following fact sheets you were given: _____________________________________________________________________          [facility] - Preparing for  Surgery Before surgery, you can play an important role.  Because skin is not sterile, your skin needs to be as free of germs as possible.  You can reduce the number of germs on your skin by washing with CHG (chlorahexidine gluconate) soap before surgery.  CHG is an antiseptic cleaner which kills germs and bonds with the skin to continue killing germs even after washing. Please DO NOT use if you have an allergy to CHG or antibacterial soaps.  If your skin becomes reddened/irritated stop using the CHG and inform your nurse when you arrive at Short Stay. Do not shave (including legs and underarms) for at least 48 hours prior to the first CHG shower.  You may shave your face/neck. Please follow these instructions carefully:  1.  Shower with CHG Soap the night before surgery and the  morning of Surgery.  2.  If you choose to wash your hair, wash your hair first as usual with your  normal  shampoo.  3.  After you shampoo, rinse your hair and body thoroughly to remove the  shampoo.                           4.  Use CHG as you would any other liquid soap.  You can apply chg directly  to the skin and wash                       Gently with a scrungie or clean washcloth.  5.  Apply the CHG Soap to your body ONLY FROM THE NECK DOWN.   Do not use on face/ open                           Wound or open sores. Avoid contact with eyes, ears mouth and genitals (private parts).                       Wash face,  Genitals (private parts) with your normal soap.             6.  Wash thoroughly, paying special attention to the area where your surgery  will be performed.  7.  Thoroughly rinse your body with warm water from the neck down.  8.  DO NOT shower/wash with your normal soap after using and rinsing off  the CHG Soap.                9.  Pat yourself dry with a clean towel.            10.  Wear clean pajamas.            11.  Place clean sheets on your bed the night of your first shower and do not  sleep with pets. Day  of Surgery : Do not apply any lotions/deodorants the morning of surgery.  Please wear clean clothes to the hospital/surgery center.  FAILURE TO FOLLOW THESE INSTRUCTIONS MAY RESULT IN THE CANCELLATION OF YOUR SURGERY PATIENT SIGNATURE_________________________________  NURSE SIGNATURE__________________________________  ________________________________________________________________________  WHAT IS A BLOOD TRANSFUSION? Blood Transfusion Information  A transfusion is the replacement of blood or some of its parts. Blood is made up of multiple cells which provide different functions.  Red blood cells  carry oxygen and are used for blood loss replacement.  White blood cells fight against infection.  Platelets control bleeding.  Plasma helps clot blood.  Other blood products are available for specialized needs, such as hemophilia or other clotting disorders. BEFORE THE TRANSFUSION  Who gives blood for transfusions?   Healthy volunteers who are fully evaluated to make sure their blood is safe. This is blood bank blood. Transfusion therapy is the safest it has ever been in the practice of medicine. Before blood is taken from a donor, a complete history is taken to make sure that person has no history of diseases nor engages in risky social behavior (examples are intravenous drug use or sexual activity with multiple partners). The donor's travel history is screened to minimize risk of transmitting infections, such as malaria. The donated blood is tested for signs of infectious diseases, such as HIV and hepatitis. The blood is then tested to be sure it is compatible with you in order to minimize the chance of a transfusion reaction. If you or a relative donates blood, this is often done in anticipation of surgery and is not appropriate for emergency situations. It takes many days to process the donated blood. RISKS AND COMPLICATIONS Although transfusion therapy is very safe and saves many lives,  the main dangers of transfusion include:   Getting an infectious disease.  Developing a transfusion reaction. This is an allergic reaction to something in the blood you were given. Every precaution is taken to prevent this. The decision to have a blood transfusion has been considered carefully by your caregiver before blood is given. Blood is not given unless the benefits outweigh the risks. AFTER THE TRANSFUSION  Right after receiving a blood transfusion, you will usually feel much better and more energetic. This is especially true if your red blood cells have gotten low (anemic). The transfusion raises the level of the red blood cells which carry oxygen, and this usually causes an energy increase.  The nurse administering the transfusion will monitor you carefully for complications. HOME CARE INSTRUCTIONS  No special instructions are needed after a transfusion. You may find your energy is better. Speak with your caregiver about any limitations on activity for underlying diseases you may have. SEEK MEDICAL CARE IF:   Your condition is not improving after your transfusion.  You develop redness or irritation at the intravenous (IV) site. SEEK IMMEDIATE MEDICAL CARE IF:  Any of the following symptoms occur over the next 12 hours:  Shaking chills.  You have a temperature by mouth above 102 F (38.9 C), not controlled by medicine.  Chest, back, or muscle pain.  People around you feel you are not acting correctly or are confused.  Shortness of breath or difficulty breathing.  Dizziness and fainting.  You get a rash or develop hives.  You have a decrease in urine output.  Your urine turns a dark color or changes to pink, red, or brown. Any of the following symptoms occur over the next 10 days:  You have a temperature by mouth above 102 F (38.9 C), not controlled by medicine.  Shortness of breath.  Weakness after normal activity.  The white part of the eye turns yellow  (jaundice).  You have a decrease in the amount of urine or are urinating less often.  Your urine turns a dark color or changes to pink, red, or brown. Document Released: 09/19/2000 Document Revised: 12/15/2011 Document Reviewed: 05/08/2008 [facility] Patient Information 2014 [place], [place].  _______________________________________________________________________  Incentive  Spirometer  An incentive spirometer is a tool that can help keep your lungs clear and active. This tool measures how well you are filling your lungs with each breath. Taking long deep breaths may help reverse or decrease the chance of developing breathing (pulmonary) problems (especially infection) following:  A long period of time when you are unable to move or be active. BEFORE THE PROCEDURE   If the spirometer includes an indicator to show your best effort, your nurse or respiratory therapist will set it to a desired goal.  If possible, sit up straight or lean slightly forward. Try not to slouch.  Hold the incentive spirometer in an upright position. INSTRUCTIONS FOR USE  1. Sit on the edge of your bed if possible, or sit up as far as you can in bed or on a chair. 2. Hold the incentive spirometer in an upright position. 3. Breathe out normally. 4. Place the mouthpiece in your mouth and seal your lips tightly around it. 5. Breathe in slowly and as deeply as possible, raising the piston or the ball toward the top of the column. 6. Hold your breath for 3-5 seconds or for as long as possible. Allow the piston or ball to fall to the bottom of the column. 7. Remove the mouthpiece from your mouth and breathe out normally. 8. Rest for a few seconds and repeat Steps 1 through 7 at least 10 times every 1-2 hours when you are awake. Take your time and take a few normal breaths between deep breaths. 9. The spirometer may include an indicator to show your best effort. Use the indicator as a goal to work toward during each  repetition. 10. After each set of 10 deep breaths, practice coughing to be sure your lungs are clear. If you have an incision (the cut made at the time of surgery), support your incision when coughing by placing a pillow or rolled up towels firmly against it. Once you are able to get out of bed, walk around indoors and cough well. You may stop using the incentive spirometer when instructed by your caregiver.  RISKS AND COMPLICATIONS  Take your time so you do not get dizzy or light-headed.  If you are in pain, you may need to take or ask for pain medication before doing incentive spirometry. It is harder to take a deep breath if you are having pain. AFTER USE  Rest and breathe slowly and easily.  It can be helpful to keep track of a log of your progress. Your caregiver can provide you with a simple table to help with this. If you are using the spirometer at home, follow these instructions: [place] IF:   You are having difficultly using the spirometer.  You have trouble using the spirometer as often as instructed.  Your pain medication is not giving enough relief while using the spirometer.  You develop fever of 100.5 F (38.1 C) or higher. SEEK IMMEDIATE MEDICAL CARE IF:   You cough up bloody sputum that had not been present before.  You develop fever of 102 F (38.9 C) or greater.  You develop worsening pain at or near the incision site. MAKE SURE YOU:   Understand these instructions.  Will watch your condition.  Will get help right away if you are not doing well or get worse. Document Released: 02/02/2007 Document Revised: 12/15/2011 Document Reviewed: 04/05/2007 [employer] Patient Information 2014 [place], [place].   ________________________________________________________________________

## 2018-02-17 ENCOUNTER — Encounter (HOSPITAL_COMMUNITY)
Admission: RE | Admit: 2018-02-17 | Discharge: 2018-02-17 | Disposition: A | Discharge status: Home / Self Care | Payer: 59 | Source: Ambulatory Visit | Attending: Specialist | Admitting: Specialist

## 2018-02-17 ENCOUNTER — Other Ambulatory Visit: Payer: Self-pay

## 2018-02-17 ENCOUNTER — Encounter (HOSPITAL_COMMUNITY): Payer: Self-pay

## 2018-02-17 DIAGNOSIS — Z01812 Encounter for preprocedural laboratory examination: Secondary | ICD-10-CM | POA: Diagnosis not present

## 2018-02-17 DIAGNOSIS — M1711 Unilateral primary osteoarthritis, right knee: Secondary | ICD-10-CM | POA: Insufficient documentation

## 2018-02-17 DIAGNOSIS — M1990 Unspecified osteoarthritis, unspecified site: Secondary | ICD-10-CM | POA: Insufficient documentation

## 2018-02-17 LAB — BASIC METABOLIC PANEL
Anion gap: 9 (ref 5–15)
BUN: 13 mg/dL (ref 6–20)
CO2: 26 mmol/L (ref 22–32)
Calcium: 9.1 mg/dL (ref 8.9–10.3)
Chloride: 107 mmol/L (ref 101–111)
Creatinine, Ser: 0.63 mg/dL (ref 0.44–1.00)
GFR calc Af Amer: >60 mL/min (ref >60)
GFR calc non Af Amer: >60 mL/min (ref >60)
Glucose, Bld: 96 mg/dL (ref 65–99)
Potassium: 4.3 mmol/L (ref 3.5–5.1)
Sodium: 142 mmol/L (ref 135–145)

## 2018-02-17 LAB — URINALYSIS, ROUTINE W REFLEX MICROSCOPIC
Bilirubin Urine: NEGATIVE
Glucose, UA: NEGATIVE mg/dL
Hgb urine dipstick: NEGATIVE
Ketones, ur: NEGATIVE mg/dL
Leukocytes, UA: NEGATIVE
Nitrite: NEGATIVE
Protein, ur: NEGATIVE mg/dL
Specific Gravity, Urine: <1.005 — ABNORMAL LOW (ref 1.005–1.030)
pH: 6.5 (ref 5.0–8.0)

## 2018-02-17 LAB — APTT: aPTT: 32 seconds (ref 24–36)

## 2018-02-17 LAB — CBC
HCT: 38.2 % (ref 36.0–46.0)
Hemoglobin: 12.2 g/dL (ref 12.0–15.0)
MCH: 27.3 pg (ref 26.0–34.0)
MCHC: 31.9 g/dL (ref 30.0–36.0)
MCV: 85.5 fL (ref 78.0–100.0)
Platelets: 225 10*3/uL (ref 150–400)
RBC: 4.47 MIL/uL (ref 3.87–5.11)
RDW: 13.9 % (ref 11.5–15.5)
WBC: 6 10*3/uL (ref 4.0–10.5)

## 2018-02-17 LAB — SURGICAL PCR SCREEN
MRSA, PCR: NEGATIVE
Staphylococcus aureus: POSITIVE — AB

## 2018-02-17 LAB — PROTIME-INR
INR: 0.99
Prothrombin Time: 13 seconds (ref 11.4–15.2)

## 2018-02-17 NOTE — H&P (Signed)
TOTAL KNEE ADMISSION H&P  Patient is being admitted for right total knee arthroplasty.  Subjective:  Chief Complaint:right knee pain.  HPI: Meghan Avila, 67 y.o. female, has a history of pain and functional disability in the right knee due to arthritis and has failed non-surgical conservative treatments for greater than 12 weeks to includecorticosteriod injections, viscosupplementation injections, weight reduction as appropriate and activity modification.  Onset of symptoms was gradual, starting 2 years ago with gradually worsening course since that time. The patient noted prior procedures on the knee to include  arthroscopy on the right knee(s).  Patient currently rates pain in the right knee(s) at 5 out of 10 with activity. Patient has worsening of pain with activity and weight bearing, pain with passive range of motion and joint swelling.  Patient has evidence of subchondral sclerosis, periarticular osteophytes and joint space narrowing by imaging studies. There is no active infection.  Patient Active Problem List   Diagnosis Date Noted  . S/P Nissen fundoplication (without gastrostomy tube) procedure 06/29/2015  . Upper airway cough syndrome 03/14/2015  . Dyspnea 03/13/2015  . Chronic venous insufficiency 07/21/2014  . Lymphedema of arm 08/11/2011   Past Medical History:  Diagnosis Date  . Arthritis   . Family history of adverse reaction to anesthesia    post nausea and vomitting  . GERD (gastroesophageal reflux disease)    hx of  . H/O wheezing    occ.  Marland Kitchen Hand swelling    related to lymph nodes   . Headache    migraines  . History of hiatal hernia   . Leg swelling    left leg -edema knee level to ankle.  . Osteoporosis   . Peripheral vascular disease (HCC)    left leg   . PONV (postoperative nausea and vomiting)   . Scoliosis     Past Surgical History:  Procedure Laterality Date  . BACK SURGERY     scoliosis surgery 1970 and 1972  . BREAST REDUCTION SURGERY  1997   . CHOLECYSTECTOMY  1992   laparoscopic  . COLONOSCOPY WITH PROPOFOL N/A 04/19/2015   Procedure: COLONOSCOPY WITH PROPOFOL;  Surgeon: Garlan Fair, MD;  Location: [facility];  Service: Endoscopy;  Laterality: N/A;  . DEEP AXILLARY SENTINEL NODE BIOPSY / EXCISION     x2  . ESOPHAGEAL MANOMETRY N/A 05/04/2015   Procedure: ESOPHAGEAL MANOMETRY (EM);  Surgeon: Garlan Fair, MD;  Location: [facility];  Service: Endoscopy;  Laterality: N/A;  . ESOPHAGOGASTRODUODENOSCOPY (EGD) WITH PROPOFOL N/A 04/19/2015   Procedure: ESOPHAGOGASTRODUODENOSCOPY (EGD) WITH PROPOFOL;  Surgeon: Garlan Fair, MD;  Location: [facility];  Service: Endoscopy;  Laterality: N/A;  . HEMORROIDECTOMY  2002, 2005  . hiatal hernia surgery     . JOINT REPLACEMENT     total right knee Dr. Theda Sers 02-26-18  . LASER ABLATION  2008   left leg   . left knee surgery - torn meniscus (left)    . LIPOMA EXCISION N/A 09/21/2015   Procedure: EXCISION 6CM BACK LIPOMA;  Surgeon: Ralene Ok, MD;  Location: [facility];  Service: General;  Laterality: N/A;  . MENISCUS REPAIR     right  . REDUCTION MAMMAPLASTY Bilateral 1998  . scoliosis  1970, 1972   no retained hardware    No current facility-administered medications for this encounter.    Current Outpatient Medications  Medication Sig Dispense Refill Last Dose  . acetaminophen (TYLENOL) 500 MG tablet Take 1,000 mg by mouth every 6 (six) hours as needed  for mild pain, moderate pain or fever.   Taking  . albuterol (PROVENTIL HFA;VENTOLIN HFA) 108 (90 Base) MCG/ACT inhaler Inhale 2 puffs into the lungs every 6 (six) hours as needed for wheezing or shortness of breath.   Taking  . aspirin-acetaminophen-caffeine (EXCEDRIN MIGRAINE) 250-250-65 MG per tablet Take 1 tablet by mouth daily as needed for headache or migraine.    Taking  . atorvastatin (LIPITOR) 10 MG tablet Take 10 mg by mouth daily.   Taking  . B Complex-C (SUPER B COMPLEX PO) Take 1 tablet by mouth daily.     .  diclofenac sodium (VOLTAREN) 1 % GEL Apply 1 application topically 2 (two) times daily as needed (KNEE PAIN).     . Ferrous Sulfate 140 (45 Fe) MG TBCR Take 1 tablet by mouth daily.     Marland Kitchen ibuprofen (ADVIL,MOTRIN) 200 MG tablet Take 200 mg by mouth every 6 (six) hours as needed for mild pain or moderate pain.   Taking  . Melatonin 3 MG TABS Take 3 mg by mouth at bedtime.      . Vitamin D, Ergocalciferol, (DRISDOL) 50000 UNITS CAPS capsule Take 50,000 Units by mouth every 7 (seven) days. Saturday   Taking   Allergies  Allergen Reactions  . Zantac [Ranitidine Hcl] Anaphylaxis  . Morphine And Related Nausea And Vomiting    Morphine, Hydrocodone Tolerates Dilaudid, Fentanyl per Epic  . Prednisone Diarrhea and Nausea And Vomiting  . Tramadol Nausea And Vomiting    AMS    Social History   Tobacco Use  . Smoking status: Former Smoker    Packs/day: 1.00    Years: 5.00    Pack years: 5.00    Types: Cigarettes    Last attempt to quit: 10/06/1978    Years since quitting: 39.3  . Smokeless tobacco: Never Used  Substance Use Topics  . Alcohol use: No    Alcohol/week: 0.0 oz    Family History  Problem Relation Age of Onset  . Varicose Veins Mother   . Rheum arthritis Father   . Cancer Sister   . Heart disease Sister        before age 65  . Hypertension Sister   . Varicose Veins Sister   . Heart attack Sister   . Hyperlipidemia Brother   . Rheum arthritis Sister   . Allergies Sister      Review of Systems  Constitutional: Negative.   HENT: Negative.   Eyes: Negative.   Respiratory: Negative.   Cardiovascular: Negative for chest pain.  Gastrointestinal: Positive for heartburn.  Genitourinary: Positive for frequency.  Musculoskeletal: Positive for joint pain and myalgias.  Skin: Negative.   Neurological: Negative.   Endo/Heme/Allergies: Negative.   Psychiatric/Behavioral: The patient is nervous/anxious.     Objective:  Physical Exam  Constitutional: She is oriented to  person, place, and time. She appears well-developed.  HENT:  Head: Normocephalic.  Eyes: EOM are normal.  Neck: Normal range of motion.  Cardiovascular: Normal rate and intact distal pulses.  Respiratory: Effort normal.  GI: Soft.  Genitourinary:  Genitourinary Comments: Deferred  Musculoskeletal:  Right knee good rom. Knee is stable. Calf is soft. 2+ pedal pulse.  Neurological: She is alert and oriented to person, place, and time.  Skin: Skin is warm and dry.  Psychiatric: Her behavior is normal.    Vital signs in last 24 hours: Temp:  [97.6 F (36.4 C)] 97.6 F (36.4 C) (05/15 1002) Pulse Rate:  [68] 68 (05/15 1002) Resp:  [  16] 16 (05/15 1002) BP: (148)/(88) 148/88 (05/15 1002) SpO2:  [99 %] 99 % (05/15 1002) Weight:  [69.4 kg (153 lb)] 69.4 kg (153 lb) (05/15 1002)  Labs:   Estimated body mass index is 29.88 kg/m as calculated from the following:   Height as of 02/17/18: 5' (1.524 m).   Weight as of 02/17/18: 69.4 kg (153 lb).   Imaging Review Plain radiographs demonstrate moderate degenerative joint disease of the right knee(s). The overall alignment ismild varus. The bone quality appears to be good for age and reported activity level.   Preoperative templating of the joint replacement has been completed, documented, and submitted to the Operating Room personnel in order to optimize intra-operative equipment management.   Anticipated LOS equal to or greater than 2 midnights due to - Age 66 and older with one or more of the following:  - Obesity  - Expected need for hospital services (PT, OT, Nursing) required for safe  discharge  - Anticipated need for postoperative skilled nursing care or inpatient rehab  - Active co-morbidities: None OR   - Unanticipated findings during/Post Surgery: None  - Patient is a high risk of re-admission due to: None     Assessment/Plan:  End stage arthritis, right knee   The patient history, physical examination, clinical  judgment of the provider and imaging studies are consistent with end stage degenerative joint disease of the right knee(s) and total knee arthroplasty is deemed medically necessary. The treatment options including medical management, injection therapy arthroscopy and arthroplasty were discussed at length. The risks and benefits of total knee arthroplasty were presented and reviewed. The risks due to aseptic loosening, infection, stiffness, patella tracking problems, thromboembolic complications and other imponderables were discussed. The patient acknowledged the explanation, agreed to proceed with the plan and consent was signed. Patient is being admitted for inpatient treatment for surgery, pain control, PT, OT, prophylactic antibiotics, VTE prophylaxis, progressive ambulation and ADL's and discharge planning. The patient is planning to be discharged home with home health services.   Will use IV tranexamic acid. Contraindications and adverse affects of Tranexamic acid discussed in detail. Patient denies any of these at this time and understands the risks and benefits.

## 2018-02-25 MED ORDER — TRANEXAMIC ACID 1000 MG/10ML IV SOLN
1000.0000 mg | INTRAVENOUS | Status: AC
  Administered 2018-02-26: 1000 mg via INTRAVENOUS
  Filled 2018-02-25: qty 1100

## 2018-02-26 ENCOUNTER — Inpatient Hospital Stay (HOSPITAL_COMMUNITY): Payer: 59 | Admitting: Anesthesiology

## 2018-02-26 ENCOUNTER — Encounter (HOSPITAL_COMMUNITY): Payer: Self-pay | Admitting: *Deleted

## 2018-02-26 ENCOUNTER — Encounter (HOSPITAL_COMMUNITY)
Admission: RE | Disposition: A | Discharge status: Home Health | Payer: Self-pay | Source: Ambulatory Visit | Attending: Specialist

## 2018-02-26 ENCOUNTER — Inpatient Hospital Stay (HOSPITAL_COMMUNITY)
Admission: RE | Admit: 2018-02-26 | Discharge: 2018-02-28 | DRG: 470 | Disposition: A | Discharge status: Home Health | Payer: 59 | Source: Ambulatory Visit | Attending: Specialist | Admitting: Specialist

## 2018-02-26 ENCOUNTER — Other Ambulatory Visit: Payer: Self-pay

## 2018-02-26 DIAGNOSIS — Z885 Allergy status to narcotic agent status: Secondary | ICD-10-CM | POA: Diagnosis not present

## 2018-02-26 DIAGNOSIS — Z87892 Personal history of anaphylaxis: Secondary | ICD-10-CM | POA: Diagnosis not present

## 2018-02-26 DIAGNOSIS — Z9049 Acquired absence of other specified parts of digestive tract: Secondary | ICD-10-CM | POA: Diagnosis not present

## 2018-02-26 DIAGNOSIS — Z888 Allergy status to other drugs, medicaments and biological substances status: Secondary | ICD-10-CM

## 2018-02-26 DIAGNOSIS — I739 Peripheral vascular disease, unspecified: Secondary | ICD-10-CM | POA: Diagnosis present

## 2018-02-26 DIAGNOSIS — M1711 Unilateral primary osteoarthritis, right knee: Secondary | ICD-10-CM | POA: Diagnosis present

## 2018-02-26 DIAGNOSIS — Z7982 Long term (current) use of aspirin: Secondary | ICD-10-CM | POA: Diagnosis not present

## 2018-02-26 DIAGNOSIS — M810 Age-related osteoporosis without current pathological fracture: Secondary | ICD-10-CM | POA: Diagnosis present

## 2018-02-26 DIAGNOSIS — M419 Scoliosis, unspecified: Secondary | ICD-10-CM | POA: Diagnosis present

## 2018-02-26 DIAGNOSIS — Z79899 Other long term (current) drug therapy: Secondary | ICD-10-CM | POA: Diagnosis not present

## 2018-02-26 DIAGNOSIS — Z96659 Presence of unspecified artificial knee joint: Secondary | ICD-10-CM

## 2018-02-26 DIAGNOSIS — K219 Gastro-esophageal reflux disease without esophagitis: Secondary | ICD-10-CM | POA: Diagnosis present

## 2018-02-26 DIAGNOSIS — Z87891 Personal history of nicotine dependence: Secondary | ICD-10-CM

## 2018-02-26 HISTORY — PX: TOTAL KNEE ARTHROPLASTY: SHX125

## 2018-02-26 LAB — TYPE AND SCREEN
ABO/RH(D): O NEG
Antibody Screen: NEGATIVE

## 2018-02-26 SURGERY — ARTHROPLASTY, KNEE, TOTAL
Anesthesia: Spinal | Site: Knee | Laterality: Right

## 2018-02-26 MED ORDER — ASPIRIN EC 325 MG PO TBEC: 325.0000 mg | DELAYED_RELEASE_TABLET | Freq: Two times a day (BID) | ORAL | 0 refills | Status: AC

## 2018-02-26 MED ORDER — MAGNESIUM CITRATE PO SOLN: 1.0000 | Freq: Once | ORAL | Status: DC | PRN

## 2018-02-26 MED ORDER — LACTATED RINGERS IV SOLN
INTRAVENOUS | Status: DC
  Administered 2018-02-26: 14:00:00 via INTRAVENOUS

## 2018-02-26 MED ORDER — PROPOFOL 10 MG/ML IV BOLUS
INTRAVENOUS | Status: AC
  Filled 2018-02-26: qty 20

## 2018-02-26 MED ORDER — ONDANSETRON HCL 4 MG/2ML IJ SOLN
4.0000 mg | Freq: Once | INTRAMUSCULAR | Status: AC
  Administered 2018-02-26: 4 mg via INTRAVENOUS
  Filled 2018-02-26: qty 2

## 2018-02-26 MED ORDER — OXYCODONE HCL 5 MG PO TABS: 5.0000 mg | ORAL_TABLET | Freq: Once | ORAL | Status: DC | PRN

## 2018-02-26 MED ORDER — DIPHENHYDRAMINE HCL 12.5 MG/5ML PO ELIX: 12.5000 mg | ORAL_SOLUTION | ORAL | Status: DC | PRN

## 2018-02-26 MED ORDER — DEXAMETHASONE SODIUM PHOSPHATE 10 MG/ML IJ SOLN
10.0000 mg | Freq: Once | INTRAMUSCULAR | Status: AC
  Administered 2018-02-26: 10 mg via INTRAVENOUS

## 2018-02-26 MED ORDER — ROPIVACAINE HCL 5 MG/ML IJ SOLN
INTRAMUSCULAR | Status: DC | PRN
  Administered 2018-02-26: 20 mL via PERINEURAL

## 2018-02-26 MED ORDER — DOCUSATE SODIUM 100 MG PO CAPS
100.0000 mg | ORAL_CAPSULE | Freq: Two times a day (BID) | ORAL | Status: DC
  Administered 2018-02-26 – 2018-02-27 (×2): 100 mg via ORAL
  Filled 2018-02-26 (×4): qty 1

## 2018-02-26 MED ORDER — PHENYLEPHRINE HCL 10 MG/ML IJ SOLN
INTRAVENOUS | Status: DC | PRN
  Administered 2018-02-26: 10 ug/min via INTRAVENOUS

## 2018-02-26 MED ORDER — PHENOL 1.4 % MT LIQD: 1.0000 | OROMUCOSAL | Status: DC | PRN

## 2018-02-26 MED ORDER — METHOCARBAMOL 1000 MG/10ML IJ SOLN
500.0000 mg | Freq: Four times a day (QID) | INTRAVENOUS | Status: DC | PRN
  Administered 2018-02-26: 500 mg via INTRAVENOUS
  Filled 2018-02-26: qty 550
  Filled 2018-02-26: qty 5

## 2018-02-26 MED ORDER — CEFAZOLIN SODIUM-DEXTROSE 2-4 GM/100ML-% IV SOLN
2.0000 g | INTRAVENOUS | Status: AC
  Administered 2018-02-26: 2 g via INTRAVENOUS
  Filled 2018-02-26: qty 100

## 2018-02-26 MED ORDER — ONDANSETRON HCL 4 MG PO TABS
4.0000 mg | ORAL_TABLET | Freq: Four times a day (QID) | ORAL | Status: DC | PRN
  Administered 2018-02-26: 4 mg via ORAL
  Filled 2018-02-26: qty 1

## 2018-02-26 MED ORDER — BISACODYL 5 MG PO TBEC: 5.0000 mg | DELAYED_RELEASE_TABLET | Freq: Every day | ORAL | Status: DC | PRN

## 2018-02-26 MED ORDER — MIDAZOLAM HCL 2 MG/2ML IJ SOLN
INTRAMUSCULAR | Status: DC | PRN
  Administered 2018-02-26: 1 mg via INTRAVENOUS

## 2018-02-26 MED ORDER — MIDAZOLAM HCL 2 MG/2ML IJ SOLN
INTRAMUSCULAR | Status: AC
  Filled 2018-02-26: qty 2

## 2018-02-26 MED ORDER — ONDANSETRON HCL 4 MG/2ML IJ SOLN: 4.0000 mg | Freq: Four times a day (QID) | INTRAMUSCULAR | Status: DC | PRN

## 2018-02-26 MED ORDER — SCOPOLAMINE 1 MG/3DAYS TD PT72
MEDICATED_PATCH | TRANSDERMAL | Status: AC
  Filled 2018-02-26: qty 1

## 2018-02-26 MED ORDER — MELATONIN 3 MG PO TABS
3.0000 mg | ORAL_TABLET | Freq: Every day | ORAL | Status: DC
  Administered 2018-02-27: 3 mg via ORAL
  Filled 2018-02-26 (×2): qty 1

## 2018-02-26 MED ORDER — SODIUM CHLORIDE 0.9 % IJ SOLN
INTRAMUSCULAR | Status: DC | PRN
  Administered 2018-02-26: 30 mL

## 2018-02-26 MED ORDER — KETOROLAC TROMETHAMINE 30 MG/ML IJ SOLN
INTRAMUSCULAR | Status: DC | PRN
  Administered 2018-02-26: 30 mg via INTRA_ARTICULAR

## 2018-02-26 MED ORDER — PROPOFOL 10 MG/ML IV BOLUS
INTRAVENOUS | Status: DC | PRN
  Administered 2018-02-26 (×5): 20 mg via INTRAVENOUS

## 2018-02-26 MED ORDER — FENTANYL CITRATE (PF) 100 MCG/2ML IJ SOLN
INTRAMUSCULAR | Status: AC
  Filled 2018-02-26: qty 2

## 2018-02-26 MED ORDER — PHENYLEPHRINE 40 MCG/ML (10ML) SYRINGE FOR IV PUSH (FOR BLOOD PRESSURE SUPPORT)
PREFILLED_SYRINGE | INTRAVENOUS | Status: DC | PRN
  Administered 2018-02-26: 80 ug via INTRAVENOUS

## 2018-02-26 MED ORDER — PROMETHAZINE HCL 25 MG/ML IJ SOLN: 6.2500 mg | INTRAMUSCULAR | Status: DC | PRN

## 2018-02-26 MED ORDER — SODIUM CHLORIDE 0.9 % IJ SOLN
INTRAMUSCULAR | Status: AC
  Filled 2018-02-26: qty 50

## 2018-02-26 MED ORDER — OXYCODONE HCL 5 MG/5ML PO SOLN
5.0000 mg | Freq: Once | ORAL | Status: DC | PRN
  Filled 2018-02-26: qty 5

## 2018-02-26 MED ORDER — ACETAMINOPHEN 325 MG PO TABS
325.0000 mg | ORAL_TABLET | Freq: Four times a day (QID) | ORAL | Status: DC | PRN
  Administered 2018-02-28 (×2): 650 mg via ORAL
  Filled 2018-02-26 (×2): qty 2

## 2018-02-26 MED ORDER — HYDROMORPHONE HCL 1 MG/ML IJ SOLN: 0.2500 mg | INTRAMUSCULAR | Status: DC | PRN

## 2018-02-26 MED ORDER — LACTATED RINGERS IV SOLN
INTRAVENOUS | Status: DC | PRN
  Administered 2018-02-26 (×2): via INTRAVENOUS

## 2018-02-26 MED ORDER — STERILE WATER FOR IRRIGATION IR SOLN
Status: DC | PRN
  Administered 2018-02-26: 2000 mL

## 2018-02-26 MED ORDER — ATORVASTATIN CALCIUM 10 MG PO TABS
10.0000 mg | ORAL_TABLET | Freq: Every day | ORAL | Status: DC
  Administered 2018-02-28: 10 mg via ORAL
  Filled 2018-02-26 (×2): qty 1

## 2018-02-26 MED ORDER — ALBUTEROL SULFATE (2.5 MG/3ML) 0.083% IN NEBU: 3.0000 mL | INHALATION_SOLUTION | Freq: Four times a day (QID) | RESPIRATORY_TRACT | Status: DC | PRN

## 2018-02-26 MED ORDER — KETOROLAC TROMETHAMINE 30 MG/ML IJ SOLN
INTRAMUSCULAR | Status: AC
  Filled 2018-02-26: qty 1

## 2018-02-26 MED ORDER — ALUM & MAG HYDROXIDE-SIMETH 200-200-20 MG/5ML PO SUSP: 30.0000 mL | ORAL | Status: DC | PRN

## 2018-02-26 MED ORDER — FERROUS SULFATE 325 (65 FE) MG PO TABS
325.0000 mg | ORAL_TABLET | Freq: Three times a day (TID) | ORAL | Status: DC
  Administered 2018-02-27 – 2018-02-28 (×5): 325 mg via ORAL
  Filled 2018-02-26 (×5): qty 1

## 2018-02-26 MED ORDER — PROPOFOL 500 MG/50ML IV EMUL
INTRAVENOUS | Status: DC | PRN
  Administered 2018-02-26: 75 ug/kg/min via INTRAVENOUS

## 2018-02-26 MED ORDER — MIDAZOLAM HCL 2 MG/2ML IJ SOLN
INTRAMUSCULAR | Status: AC
  Administered 2018-02-26: 2 mg
  Filled 2018-02-26: qty 2

## 2018-02-26 MED ORDER — CHLORHEXIDINE GLUCONATE 4 % EX LIQD: 60.0000 mL | Freq: Once | CUTANEOUS | Status: DC

## 2018-02-26 MED ORDER — POLYETHYLENE GLYCOL 3350 17 G PO PACK
17.0000 g | PACK | Freq: Every day | ORAL | Status: DC | PRN
  Administered 2018-02-27: 17 g via ORAL
  Filled 2018-02-26: qty 1

## 2018-02-26 MED ORDER — BACLOFEN 10 MG PO TABS: 10.0000 mg | ORAL_TABLET | Freq: Three times a day (TID) | ORAL | 1 refills | Status: AC | PRN

## 2018-02-26 MED ORDER — MENTHOL 3 MG MT LOZG: 1.0000 | LOZENGE | OROMUCOSAL | Status: DC | PRN

## 2018-02-26 MED ORDER — METOCLOPRAMIDE HCL 5 MG/ML IJ SOLN
5.0000 mg | Freq: Three times a day (TID) | INTRAMUSCULAR | Status: DC | PRN
  Filled 2018-02-26: qty 2

## 2018-02-26 MED ORDER — HYDROMORPHONE HCL 1 MG/ML IJ SOLN
0.5000 mg | INTRAMUSCULAR | Status: DC | PRN
  Administered 2018-02-26: 0.5 mg via INTRAVENOUS
  Administered 2018-02-27: 1 mg via INTRAVENOUS
  Administered 2018-02-27: 0.5 mg via INTRAVENOUS
  Administered 2018-02-27 – 2018-02-28 (×2): 1 mg via INTRAVENOUS
  Filled 2018-02-26 (×5): qty 1

## 2018-02-26 MED ORDER — FENTANYL CITRATE (PF) 100 MCG/2ML IJ SOLN
INTRAMUSCULAR | Status: AC
  Administered 2018-02-26: 100 ug
  Filled 2018-02-26: qty 2

## 2018-02-26 MED ORDER — ACETAMINOPHEN 500 MG PO TABS
1000.0000 mg | ORAL_TABLET | Freq: Four times a day (QID) | ORAL | Status: AC
  Administered 2018-02-26 – 2018-02-27 (×4): 1000 mg via ORAL
  Filled 2018-02-26 (×4): qty 2

## 2018-02-26 MED ORDER — SODIUM CHLORIDE 0.9 % IV SOLN
INTRAVENOUS | Status: DC
  Administered 2018-02-26: 20:00:00 via INTRAVENOUS

## 2018-02-26 MED ORDER — BUPIVACAINE-EPINEPHRINE (PF) 0.25% -1:200000 IJ SOLN
INTRAMUSCULAR | Status: AC
  Filled 2018-02-26: qty 30

## 2018-02-26 MED ORDER — METHOCARBAMOL 500 MG PO TABS
500.0000 mg | ORAL_TABLET | Freq: Four times a day (QID) | ORAL | Status: DC | PRN
  Administered 2018-02-27 – 2018-02-28 (×4): 500 mg via ORAL
  Filled 2018-02-26 (×5): qty 1

## 2018-02-26 MED ORDER — CEFAZOLIN SODIUM-DEXTROSE 2-4 GM/100ML-% IV SOLN
2.0000 g | Freq: Four times a day (QID) | INTRAVENOUS | Status: AC
  Administered 2018-02-26 – 2018-02-27 (×2): 2 g via INTRAVENOUS
  Filled 2018-02-26 (×2): qty 100

## 2018-02-26 MED ORDER — FENTANYL CITRATE (PF) 100 MCG/2ML IJ SOLN
INTRAMUSCULAR | Status: DC | PRN
  Administered 2018-02-26: 50 ug via INTRAVENOUS

## 2018-02-26 MED ORDER — HYDROMORPHONE HCL 2 MG PO TABS: 2.0000 mg | ORAL_TABLET | ORAL | 0 refills | Status: AC | PRN

## 2018-02-26 MED ORDER — ENOXAPARIN SODIUM 30 MG/0.3ML ~~LOC~~ SOLN
30.0000 mg | Freq: Two times a day (BID) | SUBCUTANEOUS | Status: DC
  Administered 2018-02-27 – 2018-02-28 (×3): 30 mg via SUBCUTANEOUS
  Filled 2018-02-26 (×3): qty 0.3

## 2018-02-26 MED ORDER — BUPIVACAINE IN DEXTROSE 0.75-8.25 % IT SOLN
INTRATHECAL | Status: DC | PRN
  Administered 2018-02-26: 2 mL via INTRATHECAL

## 2018-02-26 MED ORDER — DEXAMETHASONE SODIUM PHOSPHATE 10 MG/ML IJ SOLN
10.0000 mg | Freq: Once | INTRAMUSCULAR | Status: AC
  Administered 2018-02-27: 10 mg via INTRAVENOUS
  Filled 2018-02-26: qty 1

## 2018-02-26 MED ORDER — SCOPOLAMINE 1 MG/3DAYS TD PT72
1.0000 | MEDICATED_PATCH | TRANSDERMAL | Status: DC
  Administered 2018-02-26: 1.5 mg via TRANSDERMAL

## 2018-02-26 MED ORDER — LIDOCAINE 2% (20 MG/ML) 5 ML SYRINGE
INTRAMUSCULAR | Status: DC | PRN
  Administered 2018-02-26: 60 mg via INTRAVENOUS

## 2018-02-26 MED ORDER — 0.9 % SODIUM CHLORIDE (POUR BTL) OPTIME
TOPICAL | Status: DC | PRN
  Administered 2018-02-26: 1000 mL

## 2018-02-26 MED ORDER — METOCLOPRAMIDE HCL 5 MG PO TABS: 5.0000 mg | ORAL_TABLET | Freq: Three times a day (TID) | ORAL | Status: DC | PRN

## 2018-02-26 MED ORDER — SODIUM CHLORIDE 0.9 % IR SOLN
Status: DC | PRN
  Administered 2018-02-26: 1000 mL

## 2018-02-26 MED ORDER — HYDROMORPHONE HCL 2 MG PO TABS
2.0000 mg | ORAL_TABLET | ORAL | Status: DC | PRN
  Administered 2018-02-27 – 2018-02-28 (×6): 2 mg via ORAL
  Filled 2018-02-26 (×6): qty 1

## 2018-02-26 MED ORDER — BUPIVACAINE-EPINEPHRINE 0.25% -1:200000 IJ SOLN
INTRAMUSCULAR | Status: DC | PRN
  Administered 2018-02-26: 30 mL

## 2018-02-26 SURGICAL SUPPLY — 61 items
BAG DECANTER FOR FLEXI CONT (MISCELLANEOUS) IMPLANT
BAG ZIPLOCK 12X15 (MISCELLANEOUS) ×4 IMPLANT
BANDAGE ACE 4X5 VEL STRL LF (GAUZE/BANDAGES/DRESSINGS) ×2 IMPLANT
BANDAGE ACE 6X5 VEL STRL LF (GAUZE/BANDAGES/DRESSINGS) ×2 IMPLANT
BLADE SAG 18X100X1.27 (BLADE) ×2 IMPLANT
BLADE SAW SGTL 13.0X1.19X90.0M (BLADE) ×2 IMPLANT
BOWL SMART MIX CTS (DISPOSABLE) ×2 IMPLANT
CAP KNEE TOTAL 3 SIGMA ×2 IMPLANT
CEMENT HV SMART SET (Cement) ×4 IMPLANT
COVER SURGICAL LIGHT HANDLE (MISCELLANEOUS) ×2 IMPLANT
CUFF TOURN SGL QUICK 34 (TOURNIQUET CUFF) ×1
CUFF TRNQT CYL 34X4X40X1 (TOURNIQUET CUFF) ×1 IMPLANT
DECANTER SPIKE VIAL GLASS SM (MISCELLANEOUS) ×2 IMPLANT
DERMABOND ADVANCED (GAUZE/BANDAGES/DRESSINGS) ×1
DERMABOND ADVANCED .7 DNX12 (GAUZE/BANDAGES/DRESSINGS) ×1 IMPLANT
DRAPE U-SHAPE 47X51 STRL (DRAPES) ×2 IMPLANT
DRSG AQUACEL AG ADV 3.5X10 (GAUZE/BANDAGES/DRESSINGS) ×2 IMPLANT
DRSG TEGADERM 4X4.75 (GAUZE/BANDAGES/DRESSINGS) ×2 IMPLANT
DURAPREP 26ML APPLICATOR (WOUND CARE) ×4 IMPLANT
ELECT REM PT RETURN 15FT ADLT (MISCELLANEOUS) ×2 IMPLANT
EVACUATOR 1/8 PVC DRAIN (DRAIN) ×2 IMPLANT
GAUZE SPONGE 2X2 8PLY STRL LF (GAUZE/BANDAGES/DRESSINGS) ×1 IMPLANT
GLOVE BIO SURGEON STRL SZ7.5 (GLOVE) ×4 IMPLANT
GLOVE BIOGEL PI IND STRL 8 (GLOVE) ×2 IMPLANT
GLOVE BIOGEL PI INDICATOR 8 (GLOVE) ×2
GLOVE ECLIPSE 8.0 STRL XLNG CF (GLOVE) ×4 IMPLANT
GLOVE SURG ORTHO 9.0 STRL STRW (GLOVE) ×2 IMPLANT
GLOVE SURG SS PI 7.5 STRL IVOR (GLOVE) ×2 IMPLANT
GOWN STRL REUS W/TWL XL LVL3 (GOWN DISPOSABLE) ×4 IMPLANT
HANDPIECE INTERPULSE COAX TIP (DISPOSABLE) ×1
HOLDER FOLEY CATH W/STRAP (MISCELLANEOUS) IMPLANT
IMMOBILIZER KNEE 20 (SOFTGOODS) ×2
IMMOBILIZER KNEE 20 THIGH 36 (SOFTGOODS) ×1 IMPLANT
NDL SAFETY ECLIPSE 18X1.5 (NEEDLE) IMPLANT
NEEDLE HYPO 18GX1.5 SHARP (NEEDLE)
NS IRRIG 1000ML POUR BTL (IV SOLUTION) ×2 IMPLANT
PACK TOTAL KNEE CUSTOM (KITS) ×2 IMPLANT
POSITIONER SURGICAL ARM (MISCELLANEOUS) ×2 IMPLANT
SET HNDPC FAN SPRY TIP SCT (DISPOSABLE) ×1 IMPLANT
SET PAD KNEE POSITIONER (MISCELLANEOUS) ×2 IMPLANT
SPONGE GAUZE 2X2 STER 10/PKG (GAUZE/BANDAGES/DRESSINGS) ×1
SPONGE LAP 18X18 RF (DISPOSABLE) IMPLANT
SPONGE SURGIFOAM ABS GEL 100 (HEMOSTASIS) ×4 IMPLANT
STOCKINETTE 6  STRL (DRAPES) ×1
STOCKINETTE 6 STRL (DRAPES) ×1 IMPLANT
SUCTION FRAZIER HANDLE 12FR (TUBING) ×1
SUCTION TUBE FRAZIER 12FR DISP (TUBING) ×1 IMPLANT
SUT BONE WAX W31G (SUTURE) IMPLANT
SUT MNCRL AB 3-0 PS2 18 (SUTURE) ×2 IMPLANT
SUT VIC AB 1 CT1 27 (SUTURE) ×4
SUT VIC AB 1 CT1 27XBRD ANTBC (SUTURE) ×4 IMPLANT
SUT VIC AB 2-0 CT1 27 (SUTURE) ×2
SUT VIC AB 2-0 CT1 TAPERPNT 27 (SUTURE) ×2 IMPLANT
SUT VLOC 180 0 24IN GS25 (SUTURE) ×2 IMPLANT
SYR 3ML LL SCALE MARK (SYRINGE) IMPLANT
SYR 50ML LL SCALE MARK (SYRINGE) ×2 IMPLANT
TAPE STRIPS DRAPE STRL (GAUZE/BANDAGES/DRESSINGS) ×2 IMPLANT
TRAY FOLEY MTR SLVR 16FR STAT (SET/KITS/TRAYS/PACK) ×2 IMPLANT
WATER STERILE IRR 1000ML POUR (IV SOLUTION) ×4 IMPLANT
WRAP KNEE MAXI GEL POST OP (GAUZE/BANDAGES/DRESSINGS) ×2 IMPLANT
YANKAUER SUCT BULB TIP 10FT TU (MISCELLANEOUS) ×2 IMPLANT

## 2018-02-26 NOTE — Anesthesia Procedure Notes (Signed)
Spinal  Patient location during procedure: OR Start time: 02/26/2018 3:21 PM End time: 02/26/2018 3:26 PM Staffing Anesthesiologist: Lynda Rainwater, MD Performed: anesthesiologist  Preanesthetic Checklist Completed: patient identified, site marked, surgical consent, pre-op evaluation, timeout performed, IV checked, risks and benefits discussed and monitors and equipment checked Spinal Block Patient position: sitting Prep: Betadine Patient monitoring: heart rate, cardiac monitor, continuous pulse ox and blood pressure Approach: midline Injection technique: single-shot Needle Needle type: Quincke  Needle gauge: 22 G

## 2018-02-26 NOTE — Anesthesia Procedure Notes (Signed)
Anesthesia Regional Block: Adductor canal block   Pre-Anesthetic Checklist: ,, timeout performed, Correct Patient, Correct Site, Correct Laterality, Correct Procedure, Correct Position, site marked, Risks and benefits discussed,  Surgical consent,  Pre-op evaluation,  At surgeon's request and post-op pain management  Laterality: Right  Prep: chloraprep       Needles:  Injection technique: Single-shot  Needle Type: Stimiplex     Needle Length: 9cm  Needle Gauge: 21     Additional Needles:   Procedures:,,,, ultrasound used (permanent image in chart),,,,  Narrative:  Start time: 02/26/2018 2:22 PM End time: 02/26/2018 2:27 PM Injection made incrementally with aspirations every 5 mL.  Performed by: Personally  Anesthesiologist: Lynda Rainwater, MD

## 2018-02-26 NOTE — Anesthesia Preprocedure Evaluation (Signed)
Anesthesia Evaluation  Patient identified by MRN, date of birth, ID band Patient awake    Reviewed: Allergy & Precautions, NPO status , Patient's Chart, lab work & pertinent test results  History of Anesthesia Complications (+) PONV  Airway Mallampati: II  TM Distance: >3 FB Neck ROM: Full    Dental no notable dental hx. (+) Dental Advisory Given, Teeth Intact   Pulmonary former smoker,    Pulmonary exam normal breath sounds clear to auscultation       Cardiovascular + Peripheral Vascular Disease  Normal cardiovascular exam Rhythm:Regular Rate:Normal  EKG 09/2015 OK, ECHO 2015 EF 60%, upper arm swelling related to lymph nodes   Neuro/Psych  Headaches,    GI/Hepatic hiatal hernia, GERD  Medicated,  Endo/Other    Renal/GU      Musculoskeletal  (+) Arthritis ,   Abdominal (+)  Abdomen: soft.    Peds  Hematology 11/34   Anesthesia Other Findings   Reproductive/Obstetrics                             Anesthesia Physical  Anesthesia Plan  ASA: II  Anesthesia Plan: Spinal   Post-op Pain Management:  Regional for Post-op pain   Induction: Intravenous  PONV Risk Score and Plan: 2 and Ondansetron and Midazolam  Airway Management Planned: Simple Face Mask  Additional Equipment:   Intra-op Plan:   Post-operative Plan:   Informed Consent: I have reviewed the patients History and Physical, chart, labs and discussed the procedure including the risks, benefits and alternatives for the proposed anesthesia with the patient or authorized representative who has indicated his/her understanding and acceptance.     Plan Discussed with:   Anesthesia Plan Comments:         Anesthesia Quick Evaluation

## 2018-02-26 NOTE — Transfer of Care (Signed)
Immediate Anesthesia Transfer of Care Note  Patient: Meghan Avila  Procedure(s) Performed: RIGHT TOTAL KNEE ARTHROPLASTY (Right Knee)  Patient Location: PACU  Anesthesia Type:Spinal  Level of Consciousness: awake and alert   Airway & Oxygen Therapy: Patient Spontanous Breathing and Patient connected to face mask oxygen  Post-op Assessment: Report given to RN and Post -op Vital signs reviewed and stable  Post vital signs: Reviewed and stable  Last Vitals:  Vitals Value Taken Time  BP 118/81 02/26/2018  5:28 PM  Temp    Pulse 89 02/26/2018  5:30 PM  Resp 20 02/26/2018  5:30 PM  SpO2 99 % 02/26/2018  5:30 PM  Vitals shown include unvalidated device data.  Last Pain:  Vitals:   02/26/18 1332  TempSrc: Oral      Patients Stated Pain Goal: 3 (94/07/68 0881)  Complications: No apparent anesthesia complications

## 2018-02-26 NOTE — Op Note (Signed)
DATE OF SURGERY:  02/26/2018  TIME: 5:11 PM  PATIENT NAME:  Meghan Avila    AGE: 67 y.o.   PRE-OPERATIVE DIAGNOSIS:  Right knee osteoarthritis  POST-OPERATIVE DIAGNOSIS:  Right knee osteoarthritis  PROCEDURE:  Procedure(s): RIGHT TOTAL KNEE ARTHROPLASTY  SURGEON:  Cayson Kalb ANDREW  ASSISTANT:  Bryson Stilwell, PA-C, present and scrubbed throughout the case, critical for assistance with exposure, retraction, instrumentation, and closure.  OPERATIVE IMPLANTS: Depuy PFC Sigma Rotating Platform.  Femur size 2.5, Tibia size 2, Patella size 32 3-peg oval button, with a 10 mm polyethylene insert.   PREOPERATIVE INDICATIONS:   Meghan Avila is a 67 y.o. year old female with end stage bone on bone arthritis of the knee who failed conservative treatment and elected for Total Knee Arthroplasty.   The risks, benefits, and alternatives were discussed at length including but not limited to the risks of infection, bleeding, nerve injury, stiffness, blood clots, the need for revision surgery, cardiopulmonary complications, among others, and they were willing to proceed.  OPERATIVE DESCRIPTION:  The patient was brought to the operative room and placed in a supine position.  Spinal anesthesia was administered.  IV antibiotics were given.  The lower extremity was prepped and draped in the usual sterile fashion.  Time out was performed.  The leg was elevated and exsanguinated and the tourniquet was inflated.  Anterior quadriceps tendon splitting approach was performed.  The patella was retracted and osteophytes were removed.  The anterior horn of the medial and lateral meniscus was removed and cruciate ligaments resected.   The distal femur was opened with the drill and the intramedullary distal femoral cutting jig was utilized, set at 5 degrees resecting 10 mm off the distal femur.  Care was taken to protect the collateral ligaments.  The distal femoral sizing jig was applied, taking care  to avoid notching.  Then the 4-in-1 cutting jig was applied and the anterior and posterior femur was cut, along with the chamfer cuts.    Then the extramedullary tibial cutting jig was utilized making the appropriate cut using the anterior tibial crest as a reference building in appropriate posterior slope.  Care was taken during the cut to protect the medial and collateral ligaments.  The proximal tibia was removed along with the posterior horns of the menisci.   The posterior medial femoral osteophytes and posterior lateral femoral osteophytes were removed.    The flexion gap was then measured and was symmetric with the extension gap, measured at 10.  I completed the distal femoral preparation using the appropriate jig to prepare the box.  The patella was then measured, and cut with the saw.    The proximal tibia sized and prepared accordingly with the reamer and the punch, and then all components were trialed with the trial insert.  The knee was found to have excellent balance and full motion.    The above named components were then cemented into place and all excess cement was removed.  The trial polyethylene component was in place during cementation, and then was exchanged for the real polyethylene component.    The knee was easily taken through a range of motion and the patella tracked well and the knee irrigated copiously and the parapatellar and subcutaneous tissue closed with vicryl, and monocryl with steri strips for the skin.  The arthrotomy was closed at 90 of flexion. The wounds were dressed with sterile gauze and the tourniquet released and the patient was awakened and returned to the PACU  in stable and satisfactory condition.  There were no complications.  Total tourniquet time was 65  minutes.

## 2018-02-26 NOTE — Interval H&P Note (Signed)
History and Physical Interval Note:  02/26/2018 1:15 PM  Meghan Avila  has presented today for surgery, with the diagnosis of Right knee osteoarthritis  The various methods of treatment have been discussed with the patient and family. After consideration of risks, benefits and other options for treatment, the patient has consented to  Procedure(s): RIGHT TOTAL KNEE ARTHROPLASTY (Right) as a surgical intervention .  The patient's history has been reviewed, patient examined, no change in status, stable for surgery.  I have reviewed the patient's chart and labs.  Questions were answered to the patient's satisfaction.     Jonita Hirota ANDREW

## 2018-02-26 NOTE — Anesthesia Postprocedure Evaluation (Signed)
Anesthesia Post Note  Patient: Meghan Avila  Procedure(s) Performed: RIGHT TOTAL KNEE ARTHROPLASTY (Right Knee)     Patient location during evaluation: PACU Anesthesia Type: Spinal Level of consciousness: oriented and awake and alert Pain management: pain level controlled Vital Signs Assessment: post-procedure vital signs reviewed and stable Respiratory status: spontaneous breathing and respiratory function stable Cardiovascular status: blood pressure returned to baseline and stable Postop Assessment: no headache, no backache and no apparent nausea or vomiting Anesthetic complications: no    Last Vitals:  Vitals:   02/26/18 1800 02/26/18 1815  BP: 133/86 131/85  Pulse: 72   Resp: 19 (!) 21  Temp:    SpO2: 100%     Last Pain:  Vitals:   02/26/18 1815  TempSrc:   PainSc: 0-No pain                 Lynda Rainwater

## 2018-02-26 NOTE — Progress Notes (Signed)
AssistedDr. Miller with right, ultrasound guided, adductor canal block. Side rails up, monitors on throughout procedure. See vital signs in flow sheet. Tolerated Procedure well.  

## 2018-02-26 NOTE — Anesthesia Procedure Notes (Signed)
Date/Time: 02/26/2018 3:20 PM Performed by: Cynda Familia, CRNA Pre-anesthesia Checklist: Patient identified, Emergency Drugs available, Suction available, Patient being monitored and Timeout performed Patient Re-evaluated:Patient Re-evaluated prior to induction Oxygen Delivery Method: Simple face mask Placement Confirmation: positive ETCO2 and breath sounds checked- equal and bilateral Dental Injury: Teeth and Oropharynx as per pre-operative assessment  Comments: Sedation with spinal

## 2018-02-26 NOTE — Anesthesia Procedure Notes (Addendum)
Date/Time: 02/26/2018 3:49 PM Performed by: Cynda Familia, CRNA Ventilation: Oral airway inserted - appropriate to patient size

## 2018-02-27 LAB — BASIC METABOLIC PANEL
Anion gap: 10 (ref 5–15)
BUN: 10 mg/dL (ref 6–20)
CO2: 22 mmol/L (ref 22–32)
Calcium: 7.8 mg/dL — ABNORMAL LOW (ref 8.9–10.3)
Chloride: 101 mmol/L (ref 101–111)
Creatinine, Ser: 0.76 mg/dL (ref 0.44–1.00)
GFR calc Af Amer: >60 mL/min (ref >60)
GFR calc non Af Amer: >60 mL/min (ref >60)
Glucose, Bld: 236 mg/dL — ABNORMAL HIGH (ref 65–99)
Potassium: 3.8 mmol/L (ref 3.5–5.1)
Sodium: 133 mmol/L — ABNORMAL LOW (ref 135–145)

## 2018-02-27 LAB — CBC
HCT: 31.7 % — ABNORMAL LOW (ref 36.0–46.0)
Hemoglobin: 10.5 g/dL — ABNORMAL LOW (ref 12.0–15.0)
MCH: 27.7 pg (ref 26.0–34.0)
MCHC: 33.1 g/dL (ref 30.0–36.0)
MCV: 83.6 fL (ref 78.0–100.0)
Platelets: 215 10*3/uL (ref 150–400)
RBC: 3.79 MIL/uL — ABNORMAL LOW (ref 3.87–5.11)
RDW: 13.6 % (ref 11.5–15.5)
WBC: 11.2 10*3/uL — ABNORMAL HIGH (ref 4.0–10.5)

## 2018-02-27 MED ORDER — ACETAMINOPHEN 500 MG PO TABS: 1000.0000 mg | ORAL_TABLET | Freq: Four times a day (QID) | ORAL | 0 refills | Status: DC

## 2018-02-27 NOTE — Evaluation (Signed)
Physical Therapy Evaluation Patient Details Name: Meghan Avila MRN: [id number] DOB: 09-06-1951 Today's Date: 02/27/2018   History of Present Illness  67yo female who failed conservative management of R knee pain, received R TKR on 02/26/18. PMH hernia, PVD, hx back surgery, hx L meniscus repair, hx R meniscus repair   Clinical Impression    Patient received in bed, very pleasant and willing to participate in skilled PT session today. She requires MinA for functional bed mobility, and Min guard for functional transfers and gait with RW this session. Cues were provided as appropriate for safety and technique with functional mobility and gait this session. Also practiced stairs, able to perform safely with U railing and min guard for safety, cues also provided for technique during this activity. She was left up in the chair with all needs met and family present this morning. Moving forward she will continue to benefit from skilled PT services in the acute setting as well as skilled PT services per surgeon's recommendation after discharge.     Follow Up Recommendations Follow surgeon's recommendation for DC plan and follow-up therapies    Equipment Recommendations  Rolling walker with 5" wheels;3in1 (PT);Other (comment)(bedside commode, shower bench )    Recommendations for Other Services       Precautions / Restrictions Precautions Precautions: Knee Precaution Comments: verbally reveiwed  Required Braces or Orthoses: Knee Immobilizer - Right Knee Immobilizer - Right: On at all times Restrictions Weight Bearing Restrictions: Yes RLE Weight Bearing: Weight bearing as tolerated      Mobility  Bed Mobility Overal bed mobility: Needs Assistance Bed Mobility: Supine to Sit     Supine to sit: Min assist     General bed mobility comments: MinA to manage R LE   Transfers Overall transfer level: Needs assistance Equipment used: Rolling walker (2 wheeled) Transfers: Sit to/from  Stand Sit to Stand: Min guard         General transfer comment: Min guard for safety, cues for sequencing and technique   Ambulation/Gait Ambulation/Gait assistance: Min guard Ambulation Distance (Feet): 80 Feet Assistive device: Rolling walker (2 wheeled) Gait Pattern/deviations: Step-to pattern;Decreased step length - left;Decreased stance time - right;Decreased dorsiflexion - right;Decreased weight shift to right;Trunk flexed     General Gait Details: cues for upright posture and heel-toe pattern, technique with walker   Stairs Stairs: Yes Stairs assistance: Min guard Stair Management: One rail Right;Step to pattern;Forwards Number of Stairs: 3 General stair comments: cues for correct technique and safety   Wheelchair Mobility    Modified Rankin (Stroke Patients Only)       Balance Overall balance assessment: Mild deficits observed, not formally tested                                           Pertinent Vitals/Pain Pain Assessment: 0-10 Pain Score: 7  Pain Location: R knee  Pain Descriptors / Indicators: Aching;Sore Pain Intervention(s): Limited activity within patient's tolerance;Monitored during session;Repositioned;Ice applied    Home Living Family/patient expects to be discharged to:: Private residence Living Arrangements: Other relatives(sister will be staying with her ) Available Help at Discharge: Family;Available 24 hours/day Type of Home: House Home Access: Stairs to enter Entrance Stairs-Rails: Psychiatric nurse of Steps: 5 Home Layout: One level Home Equipment: Cane - single point;Toilet riser      Prior Function Level of Independence: Independent  Hand Dominance        Extremity/Trunk Assessment   Upper Extremity Assessment Upper Extremity Assessment: Defer to OT evaluation    Lower Extremity Assessment Lower Extremity Assessment: RLE deficits/detail;Generalized weakness RLE  Deficits / Details: fucntional strength impaired due to guarding and pain post surgery     Cervical / Trunk Assessment Cervical / Trunk Assessment: Normal  Communication   Communication: No difficulties  Cognition Arousal/Alertness: Awake/alert Behavior During Therapy: WFL for tasks assessed/performed Overall Cognitive Status: Within Functional Limits for tasks assessed                                        General Comments      Exercises Total Joint Exercises Goniometric ROM: R knee AROM in supine approximately 10 degrees extension to 40 degrees flexion, pain limited    Assessment/Plan    PT Assessment Patient needs continued PT services  PT Problem List Decreased strength;Decreased mobility;Decreased coordination;Decreased range of motion;Decreased knowledge of precautions;Decreased knowledge of use of DME;Decreased balance;Pain       PT Treatment Interventions DME instruction;Therapeutic activities;Gait training;Therapeutic exercise;Patient/family education;Stair training;Balance training;Functional mobility training;Neuromuscular re-education;Manual techniques    PT Goals (Current goals can be found in the Care Plan section)  Acute Rehab PT Goals Patient Stated Goal: to go home  PT Goal Formulation: With patient/family Time For Goal Achievement: 03/13/18 Potential to Achieve Goals: Good    Frequency 7X/week   Barriers to discharge        Co-evaluation               AM-PAC PT "6 Clicks" Daily Activity  Outcome Measure Difficulty turning over in bed (including adjusting bedclothes, sheets and blankets)?: A Little Difficulty moving from lying on back to sitting on the side of the bed? : A Little Difficulty sitting down on and standing up from a chair with arms (e.g., wheelchair, bedside commode, etc,.)?: A Little Help needed moving to and from a bed to chair (including a wheelchair)?: A Little Help needed walking in hospital room?: A  Little Help needed climbing 3-5 steps with a railing? : A Little 6 Click Score: 18    End of Session Equipment Utilized During Treatment: Gait belt Activity Tolerance: Patient tolerated treatment well Patient left: in chair;with call bell/phone within reach;with family/visitor present   PT Visit Diagnosis: Unsteadiness on feet (R26.81);Muscle weakness (generalized) (M62.81);Difficulty in walking, not elsewhere classified (R26.2)    Time: 0921-1001 PT Time Calculation (min) (ACUTE ONLY): 40 min   Charges:   PT Evaluation $PT Eval Low Complexity: 1 Low PT Treatments $Gait Training: 8-22 mins $Self Care/Home Management: 8-22   PT G Codes:        Deniece Ree PT, DPT, CBIS  Supplemental Physical Therapist [place]   Pager [phone]

## 2018-02-27 NOTE — Discharge Summary (Signed)
Physician Discharge Summary  Patient ID: Meghan Avila MRN: [id number] DOB/AGE: Feb 27, 1951 67 y.o.  Admit date: 02/26/2018 Discharge date: 02/27/2018  Admission Diagnoses: right knee OA  Discharge Diagnoses:  Active Problems:   Primary osteoarthritis of right knee   S/P knee replacement   Discharged Condition: good  Hospital Course:  Meghan Avila is a 67 y.o. who was admitted to [facility]. They were brought to the operating room on 02/26/2018 and underwent Procedure(s): RIGHT TOTAL KNEE ARTHROPLASTY.  Patient tolerated the procedure well and was later transferred to the recovery room and then to the orthopaedic floor for postoperative care.  They were given PO and IV analgesics for pain control following their surgery.  They were given 24 hours of postoperative antibiotics of  Anti-infectives (From admission, onward)   Start     Dose/Rate Route Frequency Ordered Stop   02/27/18 0600  ceFAZolin (ANCEF) IVPB 2g/100 mL premix     2 g 200 mL/hr over 30 Minutes Intravenous On call to O.R. 02/26/18 1314 02/26/18 1545   02/26/18 2200  ceFAZolin (ANCEF) IVPB 2g/100 mL premix     2 g 200 mL/hr over 30 Minutes Intravenous Every 6 hours 02/26/18 1901 02/27/18 0839     and started on DVT prophylaxis in the form of Aspirin and Lovenox.   PT and OT were ordered for total joint protocol.  Discharge planning consulted to help with postop disposition and equipment needs.  Patient had a good night on the evening of surgery and started to get up OOB with therapy on day one.  Hemovac drain was pulled without difficulty.  Continued to work with therapy into day two.  Dressing was with normal limits.  The patient had progressed with therapy and meeting their goals. Patient was seen in rounds and was ready to go home.  Consults: None  Significant Diagnostic Studies: labs:   Treatments: routine  Discharge Exam: Blood pressure (!) 100/59, pulse (!) 56, temperature 98.2 F (36.8 C),  temperature source Oral, resp. rate 15, height 5' (1.524 m), weight 69.4 kg (153 lb), SpO2 99 %. Well nourished. Alert and oriented x3. RRR, Lungs clear, BS x4. Abdomen soft and non tender. Right Calf soft and non tender. Right knee dressing C/D/I. No DVT signs. Compartment soft. No signs of infection.  Right LE grossly neurovascular intact.  Disposition:   Discharge Instructions    Call MD / Call 911   Complete by:  As directed    If you experience chest pain or shortness of breath, CALL 911 and be transported to the hospital emergency room.  If you develope a fever above 101 F, pus (white drainage) or increased drainage or redness at the wound, or calf pain, call your surgeon's office.   Constipation Prevention   Complete by:  As directed    Drink plenty of fluids.  Prune juice may be helpful.  You may use a stool softener, such as Colace (over the counter) 100 mg twice a day.  Use MiraLax (over the counter) for constipation as needed.   Diet - low sodium heart healthy   Complete by:  As directed    Discharge instructions   Complete by:  As directed    INSTRUCTIONS AFTER JOINT REPLACEMENT   Remove items at home which could result in a fall. This includes throw rugs or furniture in walking pathways ICE to the affected joint every three hours while awake for 30 minutes at a time, for at least the first  3-5 days, and then as needed for pain and swelling.  Continue to use ice for pain and swelling. You may notice swelling that will progress down to the foot and ankle.  This is normal after surgery.  Elevate your leg when you are not up walking on it.   Continue to use the breathing machine you got in the hospital (incentive spirometer) which will help keep your temperature down.  It is common for your temperature to cycle up and down following surgery, especially at night when you are not up moving around and exerting yourself.  The breathing machine keeps your lungs expanded and your temperature  down.   DIET:  As you were doing prior to hospitalization, we recommend a well-balanced diet.  DRESSING / WOUND CARE / SHOWERING  Keep the surgical dressing until follow up.  The dressing is water proof, so you can shower without any extra covering.  IF THE DRESSING FALLS OFF or the wound gets wet inside, change the dressing with sterile gauze.  Please use good hand washing techniques before changing the dressing.  Do not use any lotions or creams on the incision until instructed by your surgeon.    ACTIVITY  Increase activity slowly as tolerated, but follow the weight bearing instructions below.   No driving for 6 weeks or until further direction given by your physician.  You cannot drive while taking narcotics.  No lifting or carrying greater than 10 lbs. until further directed by your surgeon. Avoid periods of inactivity such as sitting longer than an hour when not asleep. This helps prevent blood clots.  You may return to work once you are authorized by your doctor.     WEIGHT BEARING   Weight bearing as tolerated with assist device (walker, cane, etc) as directed, use it as long as suggested by your surgeon or therapist, typically at least 4-6 weeks.   EXERCISES  Results after joint replacement surgery are often greatly improved when you follow the exercise, range of motion and muscle strengthening exercises prescribed by your doctor. Safety measures are also important to protect the joint from further injury. Any time any of these exercises cause you to have increased pain or swelling, decrease what you are doing until you are comfortable again and then slowly increase them. If you have problems or questions, call your caregiver or physical therapist for advice.   Rehabilitation is important following a joint replacement. After just a few days of immobilization, the muscles of the leg can become weakened and shrink (atrophy).  These exercises are designed to build up the tone and  strength of the thigh and leg muscles and to improve motion. Often times heat used for twenty to thirty minutes before working out will loosen up your tissues and help with improving the range of motion but do not use heat for the first two weeks following surgery (sometimes heat can increase post-operative swelling).   These exercises can be done on a training (exercise) mat, on the floor, on a table or on a bed. Use whatever works the best and is most comfortable for you.    Use music or television while you are exercising so that the exercises are a pleasant break in your day. This will make your life better with the exercises acting as a break in your routine that you can look forward to.   Perform all exercises about fifteen times, three times per day or as directed.  You should exercise both the operative  leg and the other leg as well.   Exercises include:   Quad Sets - Tighten up the muscle on the front of the thigh (Quad) and hold for 5-10 seconds.   Straight Leg Raises - With your knee straight (if you were given a brace, keep it on), lift the leg to 60 degrees, hold for 3 seconds, and slowly lower the leg.  Perform this exercise against resistance later as your leg gets stronger.  Leg Slides: Lying on your back, slowly slide your foot toward your buttocks, bending your knee up off the floor (only go as far as is comfortable). Then slowly slide your foot back down until your leg is flat on the floor again.  Angel Wings: Lying on your back spread your legs to the side as far apart as you can without causing discomfort.  Hamstring Strength:  Lying on your back, push your heel against the floor with your leg straight by tightening up the muscles of your buttocks.  Repeat, but this time bend your knee to a comfortable angle, and push your heel against the floor.  You may put a pillow under the heel to make it more comfortable if necessary.   A rehabilitation program following joint replacement  surgery can speed recovery and prevent re-injury in the future due to weakened muscles. Contact your doctor or a physical therapist for more information on knee rehabilitation.    CONSTIPATION  Constipation is defined medically as fewer than three stools per week and severe constipation as less than one stool per week.  Even if you have a regular bowel pattern at home, your normal regimen is likely to be disrupted due to multiple reasons following surgery.  Combination of anesthesia, postoperative narcotics, change in appetite and fluid intake all can affect your bowels.   YOU MUST use at least one of the following options; they are listed in order of increasing strength to get the job done.  They are all available over the counter, and you may need to use some, POSSIBLY even all of these options:    Drink plenty of fluids (prune juice may be helpful) and high fiber foods Colace 100 mg by mouth twice a day  Senokot for constipation as directed and as needed Dulcolax (bisacodyl), take with full glass of water  Miralax (polyethylene glycol) once or twice a day as needed.  If you have tried all these things and are unable to have a bowel movement in the first 3-4 days after surgery call either your surgeon or your primary doctor.    If you experience loose stools or diarrhea, hold the medications until you stool forms back up.  If your symptoms do not get better within 1 week or if they get worse, check with your doctor.  If you experience "the worst abdominal pain ever" or develop nausea or vomiting, please contact the office immediately for further recommendations for treatment.   ITCHING:  If you experience itching with your medications, try taking only a single pain pill, or even half a pain pill at a time.  You can also use Benadryl over the counter for itching or also to help with sleep.   TED HOSE STOCKINGS:  Use stockings on both legs until for at least 2 weeks or as directed by physician  office. They may be removed at night for sleeping.  MEDICATIONS:  See your medication summary on the "After Visit Summary" that nursing will review with you.  You may have some  home medications which will be placed on hold until you complete the course of blood thinner medication.  It is important for you to complete the blood thinner medication as prescribed.  PRECAUTIONS:  If you experience chest pain or shortness of breath - call 911 immediately for transfer to the hospital emergency department.   If you develop a fever greater that 101 F, purulent drainage from wound, increased redness or drainage from wound, foul odor from the wound/dressing, or calf pain - CONTACT YOUR SURGEON.                                                   FOLLOW-UP APPOINTMENTS:  If you do not already have a post-op appointment, please call the office for an appointment to be seen by your surgeon.  Guidelines for how soon to be seen are listed in your "After Visit Summary", but are typically between 1-4 weeks after surgery.  OTHER INSTRUCTIONS:   Knee Replacement:  Do not place pillow under knee, focus on keeping the knee straight while resting. CPM instructions: 0-90 degrees, 2 hours in the morning, 2 hours in the afternoon, and 2 hours in the evening. Place foam block, curve side up under heel at all times except when in CPM or when walking.  DO NOT modify, tear, cut, or change the foam block in any way.  MAKE SURE YOU:  Understand these instructions.  Get help right away if you are not doing well or get worse.    Thank you for letting us be a part of your medical care team.  It is a privilege we respect greatly.  We hope these instructions will help you stay on track for a fast and full recovery!   Increase activity slowly as tolerated   Complete by:  As directed      Allergies as of 02/27/2018      Reactions   Zantac [ranitidine Hcl] Anaphylaxis   Morphine And Related Nausea And Vomiting   Morphine,  Hydrocodone Tolerates Dilaudid, Fentanyl per Epic   Prednisone Diarrhea, Nausea And Vomiting   Tramadol Nausea And Vomiting   AMS      Medication List    TAKE these medications   acetaminophen 500 MG tablet Commonly known as:  TYLENOL Take 1,000 mg by mouth every 6 (six) hours as needed for mild pain, moderate pain or fever. What changed:  Another medication with the same name was added. Make sure you understand how and when to take each.   acetaminophen 500 MG tablet Commonly known as:  TYLENOL Take 2 tablets (1,000 mg total) by mouth every 6 (six) hours. What changed:  You were already taking a medication with the same name, and this prescription was added. Make sure you understand how and when to take each.   albuterol 108 (90 Base) MCG/ACT inhaler Commonly known as:  PROVENTIL HFA;VENTOLIN HFA Inhale 2 puffs into the lungs every 6 (six) hours as needed for wheezing or shortness of breath.   aspirin EC 325 MG tablet Take 1 tablet (325 mg total) by mouth 2 (two) times daily.   aspirin-acetaminophen-caffeine 250-250-65 MG tablet Commonly known as:  EXCEDRIN MIGRAINE Take 1 tablet by mouth daily as needed for headache or migraine.   atorvastatin 10 MG tablet Commonly known as:  LIPITOR Take 10 mg by mouth daily.  baclofen 10 MG tablet Commonly known as:  LIORESAL Take 1 tablet (10 mg total) by mouth 3 (three) times daily as needed for muscle spasms.   diclofenac sodium 1 % Gel Commonly known as:  VOLTAREN Apply 1 application topically 2 (two) times daily as needed (KNEE PAIN).   Ferrous Sulfate 140 (45 Fe) MG Tbcr Take 1 tablet by mouth daily.   HYDROmorphone 2 MG tablet Commonly known as:  DILAUDID Take 1 tablet (2 mg total) by mouth every 4 (four) hours as needed for up to 7 days for severe pain.   ibuprofen 200 MG tablet Commonly known as:  ADVIL,MOTRIN Take 200 mg by mouth every 6 (six) hours as needed for mild pain or moderate pain.   Melatonin 3 MG  Tabs Take 3 mg by mouth at bedtime.   SUPER B COMPLEX PO Take 1 tablet by mouth daily.   Vitamin D (Ergocalciferol) 50000 units Caps capsule Commonly known as:  DRISDOL Take 50,000 Units by mouth every 7 (seven) days. Saturday        Signed: Lajean Manes 02/27/2018, 9:27 AM

## 2018-02-27 NOTE — Progress Notes (Signed)
Physical Therapy Treatment Patient Details Name: Meghan Avila MRN: [id number] DOB: 10-08-1950 Today's Date: 02/27/2018    History of Present Illness 67yo female who failed conservative management of R knee pain, received R TKR on 02/26/18. PMH hernia, PVD, hx back surgery, hx L meniscus repair, hx R meniscus repair     PT Comments    Patient received in chair, pleasant and willing to participate in skilled PT services this afternoon. She requires Min guard-MinA for occasional light boost up into standing along with cues for safety this afternoon, able to progress gait and gait train approximately 161f today with RW and cues for heel-toe pattern. Gait speed and tolerance are progressing. Practiced quad sets in bed, plan to expand HEP tomorrow. She was left in bed with all needs met, visitor present this afternoon.     Follow Up Recommendations  Follow surgeon's recommendation for DC plan and follow-up therapies     Equipment Recommendations  Rolling walker with 5" wheels;3in1 (PT);Other (comment)(bedside commode, shower bench )    Recommendations for Other Services       Precautions / Restrictions Precautions Precautions: Knee Precaution Comments: verbally reveiwed  Required Braces or Orthoses: Knee Immobilizer - Right Knee Immobilizer - Right: On at all times Restrictions Weight Bearing Restrictions: Yes RLE Weight Bearing: Weight bearing as tolerated    Mobility  Bed Mobility Overal bed mobility: Needs Assistance Bed Mobility: Sit to Supine     Supine to sit: Min assist Sit to supine: Min assist   General bed mobility comments: MinA to manage R LE   Transfers Overall transfer level: Needs assistance Equipment used: Rolling walker (2 wheeled) Transfers: Sit to/from Stand Sit to Stand: Min assist;Min guard         General transfer comment: Min guard-MinA for occasional light boost with functional transfers   Ambulation/Gait Ambulation/Gait assistance: Min  guard Ambulation Distance (Feet): 100 Feet Assistive device: Rolling walker (2 wheeled) Gait Pattern/deviations: Step-to pattern;Decreased step length - left;Decreased stance time - right;Decreased dorsiflexion - right;Decreased weight shift to right;Trunk flexed     General Gait Details: cues for heel-toe pattern, gait speed and tolerance is improving    Stairs Stairs: Yes Stairs assistance: Min guard Stair Management: One rail Right;Step to pattern;Forwards Number of Stairs: 3 General stair comments: cues for correct technique and safety    Wheelchair Mobility    Modified Rankin (Stroke Patients Only)       Balance Overall balance assessment: Mild deficits observed, not formally tested                                          Cognition Arousal/Alertness: Awake/alert Behavior During Therapy: WFL for tasks assessed/performed Overall Cognitive Status: Within Functional Limits for tasks assessed                                        Exercises Total Joint Exercises Goniometric ROM: R knee AROM in supine approximately 10 degrees extension to 40 degrees flexion, pain limited     General Comments        Pertinent Vitals/Pain Pain Assessment: 0-10 Pain Score: 6  Pain Location: R knee  Pain Descriptors / Indicators: Aching;Sore Pain Intervention(s): Limited activity within patient's tolerance;Monitored during session;Repositioned;Ice applied    Home Living Family/patient expects to be discharged  to:: Private residence Living Arrangements: Other relatives(sister will be staying with her ) Available Help at Discharge: Family;Available 24 hours/day Type of Home: House Home Access: Stairs to enter Entrance Stairs-Rails: Right;Left Home Layout: One level Home Equipment: Cane - single point;Toilet riser      Prior Function Level of Independence: Independent          PT Goals (current goals can now be found in the care plan  section) Acute Rehab PT Goals Patient Stated Goal: to go home  PT Goal Formulation: With patient/family Time For Goal Achievement: 03/13/18 Potential to Achieve Goals: Good Progress towards PT goals: Progressing toward goals    Frequency    7X/week      PT Plan Current plan remains appropriate    Co-evaluation              AM-PAC PT "6 Clicks" Daily Activity  Outcome Measure  Difficulty turning over in bed (including adjusting bedclothes, sheets and blankets)?: A Little Difficulty moving from lying on back to sitting on the side of the bed? : A Little Difficulty sitting down on and standing up from a chair with arms (e.g., wheelchair, bedside commode, etc,.)?: A Little Help needed moving to and from a bed to chair (including a wheelchair)?: A Little Help needed walking in hospital room?: A Little Help needed climbing 3-5 steps with a railing? : A Little 6 Click Score: 18    End of Session Equipment Utilized During Treatment: Gait belt Activity Tolerance: Patient tolerated treatment well Patient left: with call bell/phone within reach;with family/visitor present;in bed   PT Visit Diagnosis: Unsteadiness on feet (R26.81);Muscle weakness (generalized) (M62.81);Difficulty in walking, not elsewhere classified (R26.2)     Time: 7588-3254 PT Time Calculation (min) (ACUTE ONLY): 23 min  Charges:  $Gait Training: 8-22 mins $Therapeutic Activity: 8-22 mins $Self Care/Home Management: 8-22                    G Codes:       Deniece Ree PT, DPT, CBIS  Supplemental Physical Therapist [place]   Pager [phone]

## 2018-02-27 NOTE — Progress Notes (Signed)
Subjective: 1 Day Post-Op Procedure(s) (LRB): RIGHT TOTAL KNEE ARTHROPLASTY (Right) Patient reports pain as 3 on 0-10 scale.  Tolerating Po's. Denies SOB and CP.   Objective: Vital signs in last 24 hours: Temp:  [97.5 F (36.4 C)-98.2 F (36.8 C)] 98.2 F (36.8 C) (05/25 0551) Pulse Rate:  [56-89] 56 (05/25 0551) Resp:  [15-21] 15 (05/25 0551) BP: (99-143)/(59-90) 100/59 (05/25 0551) SpO2:  [96 %-100 %] 99 % (05/25 0551) Weight:  [69.4 kg (153 lb)] 69.4 kg (153 lb) (05/24 1332)  Intake/Output from previous day: 05/24 0701 - 05/25 0700 In: 2550 [P.O.:320; I.V.:2075; IV Piggyback:155] Out: 2445 [Urine:2350; Drains:45; Blood:50] Intake/Output this shift: Total I/O In: 120 [P.O.:120] Out: -   Recent Labs    02/27/18 0454  HGB 10.5*   Recent Labs    02/27/18 0454  WBC 11.2*  RBC 3.79*  HCT 31.7*  PLT 215   Recent Labs    02/27/18 0454  NA 133*  K 3.8  CL 101  CO2 22  BUN 10  CREATININE 0.76  GLUCOSE 236*  CALCIUM 7.8*   No results for input(s): LABPT, INR in the last 72 hours.  Well nourished. Alert and oriented x3. RRR, Lungs clear, BS x4. Abdomen soft and non tender. Right Calf soft and non tender. Right knee dressing C/D/I. No DVT signs. Compartment soft. No signs of infection.  Right LE grossly neurovascular intact.  Anticipated LOS equal to or greater than 2 midnights due to - Age 67 and older with one or more of the following:  - Obesity  - Expected need for hospital services (PT, OT, Nursing) required for safe  discharge  - Anticipated need for postoperative skilled nursing care or inpatient rehab  - Active co-morbidities: None OR   - Unanticipated findings during/Post Surgery: None  - Patient is a high risk of re-admission due to: None   Assessment/Plan: 1 Day Post-Op Procedure(s) (LRB): RIGHT TOTAL KNEE ARTHROPLASTY (Right) Advance diet Up with therapy D/C IV fluids Plan for discharge tomorrow Discharge home with home health  Drain D/c'ed  with tip intact    Shellye Zandi L 02/27/2018, 8:31 AM

## 2018-02-28 LAB — CBC
HCT: 33.2 % — ABNORMAL LOW (ref 36.0–46.0)
Hemoglobin: 10.6 g/dL — ABNORMAL LOW (ref 12.0–15.0)
MCH: 27.2 pg (ref 26.0–34.0)
MCHC: 31.9 g/dL (ref 30.0–36.0)
MCV: 85.1 fL (ref 78.0–100.0)
Platelets: 205 10*3/uL (ref 150–400)
RBC: 3.9 MIL/uL (ref 3.87–5.11)
RDW: 14 % (ref 11.5–15.5)
WBC: 10 10*3/uL (ref 4.0–10.5)

## 2018-02-28 NOTE — Care Management (Signed)
Pt needs youth RW instead of regular size that was delivered.  Youth RW ordered from [facility]

## 2018-02-28 NOTE — Care Management Note (Signed)
Case Management Note  Patient Details  Name: Meghan Avila MRN: [id number] Date of Birth: [birth year]  Subjective/Objective:   Right TKA                 Action/Plan: NCM spoke to pt and offered choice for HH/list provided. Pt agreeable to [facility]. [facility] delivered RW and 3n1 to room. States sister will assist with care at home.  Expected Discharge Date:  02/28/18               Expected Discharge Plan:  [place]  In-House Referral:  NA  Discharge planning Services  CM Consult  Post Acute Care Choice:  Home Health Choice offered to:  Patient  DME Arranged:  3-N-1, Walker rolling DME Agency:  [place]:  PT [place] Agency:  [facility]  Status of Service:  Completed, signed off  If discussed at [employer], dates discussed:    Additional Comments:  Erenest Rasher, RN 02/28/2018, 9:24 AM

## 2018-02-28 NOTE — Progress Notes (Signed)
Discharge instructions given to patient and family. D Pariss Hommes RN 

## 2018-02-28 NOTE — Progress Notes (Signed)
     Subjective: 2 Days Post-Op Procedure(s) (LRB): RIGHT TOTAL KNEE ARTHROPLASTY (Right)   Patient reports pain as moderate, some increased pain after pushing herself with PT yesterday.  Plan for PT today.  Plan to discharge home if she does well with PT.     Objective:   VITALS:   Vitals:   02/27/18 2134 02/28/18 0512  BP: 127/83 129/87  Pulse: 62 61  Resp: 16 13  Temp: 98.1 F (36.7 C) 98 F (36.7 C)  SpO2: 99% 100%    Dorsiflexion/Plantar flexion intact Incision: dressing C/D/I No cellulitis present Compartment soft  LABS Recent Labs    02/27/18 0454 02/28/18 0417  HGB 10.5* 10.6*  HCT 31.7* 33.2*  WBC 11.2* 10.0  PLT 215 205    Recent Labs    02/27/18 0454  NA 133*  K 3.8  BUN 10  CREATININE 0.76  GLUCOSE 236*     Assessment/Plan: 2 Days Post-Op Procedure(s) (LRB): RIGHT TOTAL KNEE ARTHROPLASTY (Right) Up with therapy Discharge home Follow up in 2 weeks at [facility] ([place] Follow up with Dr. Theda Sers in 2 weeks.  Contact information:  [facility] [address], [address] [place]. Madalene Mickler   PAC  02/28/2018, 8:33 AM

## 2018-02-28 NOTE — Plan of Care (Signed)
Plan of care 

## 2018-02-28 NOTE — Progress Notes (Signed)
Physical Therapy Treatment Patient Details Name: Meghan Avila MRN: [id number] DOB: 07/09/1951 Today's Date: 02/28/2018    History of Present Illness 67yo female who failed conservative management of R knee pain, received R TKR on 02/26/18. PMH hernia, PVD, hx back surgery, hx L meniscus repair, hx R meniscus repair     PT Comments    POD # 2 pm session with sister present Assisted with safe handling during transfers and gait.  Performed stairs again with sister "hands on" for instruction.  Advised to wear KI for increased support for stairs.   Follow Up Recommendations  Follow surgeon's recommendation for DC plan and follow-up therapies     Equipment Recommendations  Rolling walker with 5" wheels;3in1 (PT);Other (comment)    Recommendations for Other Services       Precautions / Restrictions Precautions Precautions: Knee Precaution Comments: instructed on KI use for stairs Required Braces or Orthoses: Knee Immobilizer - Right Knee Immobilizer - Right: On at all times Restrictions Weight Bearing Restrictions: Yes RLE Weight Bearing: Weight bearing as tolerated    Mobility  Bed Mobility               General bed mobility comments: OOB in recliner  Transfers Overall transfer level: Needs assistance Equipment used: Rolling walker (2 wheeled) Transfers: Sit to/from Stand Sit to Stand: Supervision;Min guard         General transfer comment: Min guard-MinA for occasional light boost with functional transfers   Ambulation/Gait Ambulation/Gait assistance: Supervision;Min guard Ambulation Distance (Feet): 65 Feet Assistive device: Rolling walker (2 wheeled) Gait Pattern/deviations: Step-to pattern;Decreased step length - left;Decreased stance time - right;Decreased dorsiflexion - right;Decreased weight shift to right;Trunk flexed     General Gait Details: tolerated well   Stairs Stairs: Yes Stairs assistance: Min guard Stair Management: One rail Right;Step  to pattern;Forwards;With crutches Number of Stairs: 2 General stair comments: with one rail and one crutch 23% VC's on proper tech   Wheelchair Mobility    Modified Rankin (Stroke Patients Only)       Balance                                            Cognition Arousal/Alertness: Awake/alert Behavior During Therapy: WFL for tasks assessed/performed Overall Cognitive Status: Within Functional Limits for tasks assessed                                        Exercises      General Comments        Pertinent Vitals/Pain Pain Score: 5  Pain Location: R knee  Pain Descriptors / Indicators: Aching;Sore Pain Intervention(s): Monitored during session;Repositioned;Ice applied    Home Living                      Prior Function            PT Goals (current goals can now be found in the care plan section) Progress towards PT goals: Progressing toward goals    Frequency    7X/week      PT Plan Current plan remains appropriate    Co-evaluation              AM-PAC PT "6 Clicks" Daily Activity  Outcome Measure  Difficulty turning over in  bed (including adjusting bedclothes, sheets and blankets)?: A Little Difficulty moving from lying on back to sitting on the side of the bed? : A Little Difficulty sitting down on and standing up from a chair with arms (e.g., wheelchair, bedside commode, etc,.)?: A Little Help needed moving to and from a bed to chair (including a wheelchair)?: A Little Help needed walking in hospital room?: A Little Help needed climbing 3-5 steps with a railing? : A Little 6 Click Score: 18    End of Session Equipment Utilized During Treatment: Gait belt Activity Tolerance: Patient tolerated treatment well Patient left: with call bell/phone within reach;with family/visitor present;in bed   PT Visit Diagnosis: Unsteadiness on feet (R26.81);Muscle weakness (generalized) (M62.81);Difficulty in  walking, not elsewhere classified (R26.2)     Time: 1315-1330 PT Time Calculation (min) (ACUTE ONLY): 15 min  Charges:  $Gait Training: 8-22 mins                    G Codes:       Rica Koyanagi  PTA [facility]  Acute  Rehab Pager      [phone]

## 2018-02-28 NOTE — Progress Notes (Signed)
Physical Therapy Treatment Patient Details Name: Meghan Avila MRN: [id number] DOB: Feb 22, 1951 Today's Date: 02/28/2018    History of Present Illness 67yo female who failed conservative management of R knee pain, received R TKR on 02/26/18. PMH hernia, PVD, hx back surgery, hx L meniscus repair, hx R meniscus repair     PT Comments    POD # 2 am session Applied KI and instructed on use for stairs.  Instructed on proper application and when to D/C.  Assisted with amb in hallway, practicted stairs then performed some TKR TE;s following HEP.  Will need another PT session with sister to address stair safety.    Follow Up Recommendations  Follow surgeon's recommendation for DC plan and follow-up therapies     Equipment Recommendations  Rolling walker with 5" wheels;3in1 (PT);Other (comment)    Recommendations for Other Services       Precautions / Restrictions Precautions Precautions: Knee Precaution Comments: instructed on KI use for stairs Required Braces or Orthoses: Knee Immobilizer - Right Knee Immobilizer - Right: On at all times Restrictions Weight Bearing Restrictions: Yes RLE Weight Bearing: Weight bearing as tolerated    Mobility  Bed Mobility               General bed mobility comments: OOB in recliner  Transfers Overall transfer level: Needs assistance Equipment used: Rolling walker (2 wheeled) Transfers: Sit to/from Stand Sit to Stand: Supervision;Min guard         General transfer comment: Min guard-MinA for occasional light boost with functional transfers   Ambulation/Gait Ambulation/Gait assistance: Supervision;Min guard Ambulation Distance (Feet): 65 Feet Assistive device: Rolling walker (2 wheeled) Gait Pattern/deviations: Step-to pattern;Decreased step length - left;Decreased stance time - right;Decreased dorsiflexion - right;Decreased weight shift to right;Trunk flexed     General Gait Details: tolerated well   Stairs Stairs:  Yes Stairs assistance: Min guard Stair Management: One rail Right;Step to pattern;Forwards;With crutches Number of Stairs: 2 General stair comments: with one rail and one crutch 23% VC's on proper tech   Wheelchair Mobility    Modified Rankin (Stroke Patients Only)       Balance                                            Cognition Arousal/Alertness: Awake/alert Behavior During Therapy: WFL for tasks assessed/performed Overall Cognitive Status: Within Functional Limits for tasks assessed                                        Exercises   Total Knee Replacement TE's 10 reps B LE ankle pumps 10 reps towel squeezes 10 reps knee presses 10 reps heel slides  10 reps SAQ's 10 reps SLR's 10 reps ABD Followed by ICE     General Comments        Pertinent Vitals/Pain Pain Score: 5  Pain Location: R knee  Pain Descriptors / Indicators: Aching;Sore Pain Intervention(s): Monitored during session;Repositioned;Ice applied    Home Living                      Prior Function            PT Goals (current goals can now be found in the care plan section) Progress towards PT goals: Progressing toward goals  Frequency    7X/week      PT Plan Current plan remains appropriate    Co-evaluation              AM-PAC PT "6 Clicks" Daily Activity  Outcome Measure  Difficulty turning over in bed (including adjusting bedclothes, sheets and blankets)?: A Little Difficulty moving from lying on back to sitting on the side of the bed? : A Little Difficulty sitting down on and standing up from a chair with arms (e.g., wheelchair, bedside commode, etc,.)?: A Little Help needed moving to and from a bed to chair (including a wheelchair)?: A Little Help needed walking in hospital room?: A Little Help needed climbing 3-5 steps with a railing? : A Little 6 Click Score: 18    End of Session Equipment Utilized During Treatment: Gait  belt Activity Tolerance: Patient tolerated treatment well Patient left: with call bell/phone within reach;with family/visitor present;in bed   PT Visit Diagnosis: Unsteadiness on feet (R26.81);Muscle weakness (generalized) (M62.81);Difficulty in walking, not elsewhere classified (R26.2)     Time: 1040-1108 PT Time Calculation (min) (ACUTE ONLY): 28 min  Charges:  $Gait Training: 8-22 mins $Therapeutic Exercise: 8-22 mins                    G Codes:       Rica Koyanagi  PTA [facility]  Acute  Rehab Pager      [phone]

## 2018-03-02 ENCOUNTER — Encounter (HOSPITAL_COMMUNITY): Payer: Self-pay | Admitting: Specialist

## 2018-03-16 DIAGNOSIS — M25561 Pain in right knee: Secondary | ICD-10-CM | POA: Insufficient documentation

## 2018-03-22 DIAGNOSIS — Z96651 Presence of right artificial knee joint: Secondary | ICD-10-CM | POA: Insufficient documentation

## 2018-03-22 DIAGNOSIS — Z5189 Encounter for other specified aftercare: Secondary | ICD-10-CM | POA: Insufficient documentation

## 2018-06-17 ENCOUNTER — Other Ambulatory Visit: Payer: Self-pay | Admitting: Family Medicine

## 2018-06-17 DIAGNOSIS — Z1231 Encounter for screening mammogram for malignant neoplasm of breast: Secondary | ICD-10-CM

## 2018-07-20 ENCOUNTER — Ambulatory Visit
Admission: RE | Admit: 2018-07-20 | Discharge: 2018-07-20 | Disposition: A | Discharge status: Home / Self Care | Payer: 59 | Source: Ambulatory Visit | Attending: Family Medicine | Admitting: Family Medicine

## 2018-07-20 DIAGNOSIS — Z1231 Encounter for screening mammogram for malignant neoplasm of breast: Secondary | ICD-10-CM

## 2019-02-23 ENCOUNTER — Other Ambulatory Visit: Payer: Self-pay | Admitting: Family Medicine

## 2019-02-23 DIAGNOSIS — N6320 Unspecified lump in the left breast, unspecified quadrant: Secondary | ICD-10-CM

## 2019-03-10 ENCOUNTER — Ambulatory Visit
Admission: RE | Admit: 2019-03-10 | Discharge: 2019-03-10 | Disposition: A | Discharge status: Home / Self Care | Payer: 59 | Source: Ambulatory Visit | Attending: Family Medicine | Admitting: Family Medicine

## 2019-03-10 ENCOUNTER — Other Ambulatory Visit: Payer: Self-pay

## 2019-03-10 DIAGNOSIS — N6320 Unspecified lump in the left breast, unspecified quadrant: Secondary | ICD-10-CM

## 2019-03-16 ENCOUNTER — Ambulatory Visit (HOSPITAL_COMMUNITY)
Admission: RE | Admit: 2019-03-16 | Discharge: 2019-03-16 | Disposition: A | Discharge status: Home / Self Care | Payer: 59 | Source: Ambulatory Visit | Attending: Family Medicine | Admitting: Family Medicine

## 2019-03-16 ENCOUNTER — Other Ambulatory Visit: Payer: Self-pay

## 2019-03-16 ENCOUNTER — Encounter (HOSPITAL_COMMUNITY): Payer: Self-pay

## 2019-03-16 DIAGNOSIS — M810 Age-related osteoporosis without current pathological fracture: Secondary | ICD-10-CM | POA: Insufficient documentation

## 2019-03-16 MED ORDER — ZOLEDRONIC ACID 5 MG/100ML IV SOLN
INTRAVENOUS | Status: AC
  Administered 2019-03-16: 5 mg
  Filled 2019-03-16: qty 100

## 2019-03-16 MED ORDER — SODIUM CHLORIDE 0.9 % IV SOLN
Freq: Once | INTRAVENOUS | Status: AC
  Administered 2019-03-16: 11:00:00 via INTRAVENOUS

## 2019-03-16 MED ORDER — ZOLEDRONIC ACID 5 MG/100ML IV SOLN: 5.0000 mg | Freq: Once | INTRAVENOUS | Status: DC

## 2019-03-16 NOTE — Discharge Instructions (Signed)

## 2019-04-13 DIAGNOSIS — M25512 Pain in left shoulder: Secondary | ICD-10-CM | POA: Insufficient documentation

## 2019-06-08 DIAGNOSIS — Z0000 Encounter for general adult medical examination without abnormal findings: Secondary | ICD-10-CM | POA: Diagnosis not present

## 2019-06-08 DIAGNOSIS — E559 Vitamin D deficiency, unspecified: Secondary | ICD-10-CM | POA: Diagnosis not present

## 2019-06-08 DIAGNOSIS — Z23 Encounter for immunization: Secondary | ICD-10-CM | POA: Diagnosis not present

## 2019-06-08 DIAGNOSIS — M810 Age-related osteoporosis without current pathological fracture: Secondary | ICD-10-CM | POA: Diagnosis not present

## 2019-06-08 DIAGNOSIS — I2510 Atherosclerotic heart disease of native coronary artery without angina pectoris: Secondary | ICD-10-CM | POA: Diagnosis not present

## 2019-06-08 DIAGNOSIS — E669 Obesity, unspecified: Secondary | ICD-10-CM | POA: Diagnosis not present

## 2019-06-08 DIAGNOSIS — Z6830 Body mass index (BMI) 30.0-30.9, adult: Secondary | ICD-10-CM | POA: Diagnosis not present

## 2019-06-08 DIAGNOSIS — R1031 Right lower quadrant pain: Secondary | ICD-10-CM | POA: Diagnosis not present

## 2019-06-10 ENCOUNTER — Other Ambulatory Visit: Payer: Self-pay | Admitting: Family Medicine

## 2019-06-10 DIAGNOSIS — Z1231 Encounter for screening mammogram for malignant neoplasm of breast: Secondary | ICD-10-CM

## 2019-06-10 DIAGNOSIS — M810 Age-related osteoporosis without current pathological fracture: Secondary | ICD-10-CM

## 2019-06-15 ENCOUNTER — Other Ambulatory Visit: Payer: Self-pay | Admitting: Family Medicine

## 2019-06-15 DIAGNOSIS — R109 Unspecified abdominal pain: Secondary | ICD-10-CM

## 2019-06-24 ENCOUNTER — Ambulatory Visit
Admission: RE | Admit: 2019-06-24 | Discharge: 2019-06-24 | Disposition: A | Discharge status: Home / Self Care | Payer: PPO | Source: Ambulatory Visit | Attending: Family Medicine | Admitting: Family Medicine

## 2019-06-24 DIAGNOSIS — R109 Unspecified abdominal pain: Secondary | ICD-10-CM

## 2019-06-24 DIAGNOSIS — K5730 Diverticulosis of large intestine without perforation or abscess without bleeding: Secondary | ICD-10-CM | POA: Diagnosis not present

## 2019-06-24 MED ORDER — IOPAMIDOL (ISOVUE-300) INJECTION 61%
100.0000 mL | Freq: Once | INTRAVENOUS | Status: AC | PRN
  Administered 2019-06-24: 100 mL via INTRAVENOUS

## 2019-07-25 DIAGNOSIS — R159 Full incontinence of feces: Secondary | ICD-10-CM | POA: Diagnosis not present

## 2019-07-25 DIAGNOSIS — K629 Disease of anus and rectum, unspecified: Secondary | ICD-10-CM | POA: Diagnosis not present

## 2019-07-28 ENCOUNTER — Ambulatory Visit
Admission: RE | Admit: 2019-07-28 | Discharge: 2019-07-28 | Disposition: A | Discharge status: Home / Self Care | Payer: PPO | Source: Ambulatory Visit | Attending: Family Medicine | Admitting: Family Medicine

## 2019-07-28 ENCOUNTER — Other Ambulatory Visit: Payer: Self-pay

## 2019-07-28 DIAGNOSIS — Z1231 Encounter for screening mammogram for malignant neoplasm of breast: Secondary | ICD-10-CM

## 2019-07-30 ENCOUNTER — Other Ambulatory Visit (HOSPITAL_COMMUNITY)
Admission: RE | Admit: 2019-07-30 | Discharge: 2019-07-30 | Disposition: A | Discharge status: Home / Self Care | Payer: PPO | Source: Ambulatory Visit | Attending: General Surgery | Admitting: General Surgery

## 2019-07-30 DIAGNOSIS — Z20828 Contact with and (suspected) exposure to other viral communicable diseases: Secondary | ICD-10-CM | POA: Insufficient documentation

## 2019-07-30 DIAGNOSIS — Z01812 Encounter for preprocedural laboratory examination: Secondary | ICD-10-CM | POA: Insufficient documentation

## 2019-07-31 LAB — NOVEL CORONAVIRUS, NAA (HOSP ORDER, SEND-OUT TO REF LAB; TAT 18-24 HRS): SARS-CoV-2, NAA: NOT DETECTED

## 2019-08-03 ENCOUNTER — Encounter (HOSPITAL_COMMUNITY)
Admission: RE | Disposition: A | Discharge status: Home / Self Care | Payer: Self-pay | Source: Home / Self Care | Attending: General Surgery

## 2019-08-03 ENCOUNTER — Ambulatory Visit (HOSPITAL_COMMUNITY)
Admission: RE | Admit: 2019-08-03 | Discharge: 2019-08-03 | Disposition: A | Discharge status: Home / Self Care | Payer: PPO | Attending: General Surgery | Admitting: General Surgery

## 2019-08-03 DIAGNOSIS — R159 Full incontinence of feces: Secondary | ICD-10-CM | POA: Diagnosis not present

## 2019-08-03 HISTORY — PX: ANAL RECTAL MANOMETRY: SHX6358

## 2019-08-03 SURGERY — MANOMETRY, ANORECTAL

## 2019-08-03 NOTE — Progress Notes (Addendum)
Anorectal manometry done per protocol.  Patient tolerated well.  50cc balloon expelled in 20 seconds.  Dr Leighton Ruff notified of report to be read.

## 2019-08-04 ENCOUNTER — Encounter (HOSPITAL_COMMUNITY): Payer: Self-pay | Admitting: General Surgery

## 2019-08-04 DIAGNOSIS — R159 Full incontinence of feces: Secondary | ICD-10-CM | POA: Diagnosis not present

## 2019-08-12 ENCOUNTER — Other Ambulatory Visit: Payer: Self-pay

## 2019-08-12 DIAGNOSIS — Z20822 Contact with and (suspected) exposure to covid-19: Secondary | ICD-10-CM

## 2019-08-13 LAB — NOVEL CORONAVIRUS, NAA: SARS-CoV-2, NAA: NOT DETECTED

## 2019-09-20 ENCOUNTER — Other Ambulatory Visit: Payer: Self-pay

## 2019-09-20 ENCOUNTER — Ambulatory Visit
Admission: RE | Admit: 2019-09-20 | Discharge: 2019-09-20 | Disposition: A | Discharge status: Home / Self Care | Payer: PPO | Source: Ambulatory Visit | Attending: Family Medicine | Admitting: Family Medicine

## 2019-09-20 DIAGNOSIS — M810 Age-related osteoporosis without current pathological fracture: Secondary | ICD-10-CM | POA: Diagnosis not present

## 2019-09-20 DIAGNOSIS — Z780 Asymptomatic menopausal state: Secondary | ICD-10-CM | POA: Diagnosis not present

## 2019-09-20 DIAGNOSIS — M85852 Other specified disorders of bone density and structure, left thigh: Secondary | ICD-10-CM | POA: Diagnosis not present

## 2019-09-26 DIAGNOSIS — M810 Age-related osteoporosis without current pathological fracture: Secondary | ICD-10-CM | POA: Diagnosis not present

## 2019-09-26 DIAGNOSIS — E559 Vitamin D deficiency, unspecified: Secondary | ICD-10-CM | POA: Diagnosis not present

## 2019-10-10 DIAGNOSIS — K629 Disease of anus and rectum, unspecified: Secondary | ICD-10-CM | POA: Diagnosis not present

## 2019-10-10 DIAGNOSIS — R159 Full incontinence of feces: Secondary | ICD-10-CM | POA: Diagnosis not present

## 2019-10-27 DIAGNOSIS — M810 Age-related osteoporosis without current pathological fracture: Secondary | ICD-10-CM | POA: Diagnosis not present

## 2019-11-04 ENCOUNTER — Ambulatory Visit: Payer: PPO

## 2019-11-12 ENCOUNTER — Ambulatory Visit: Payer: PPO | Attending: Internal Medicine

## 2019-11-12 DIAGNOSIS — Z23 Encounter for immunization: Secondary | ICD-10-CM | POA: Insufficient documentation

## 2019-11-15 ENCOUNTER — Ambulatory Visit: Payer: PPO

## 2019-12-07 ENCOUNTER — Ambulatory Visit: Payer: PPO | Attending: Internal Medicine

## 2019-12-07 DIAGNOSIS — Z23 Encounter for immunization: Secondary | ICD-10-CM | POA: Insufficient documentation

## 2019-12-07 NOTE — Progress Notes (Signed)
   Covid-19 Vaccination Clinic  Name:  Meghan Avila    MRN: [id number] DOB: 06-Jul-1951  12/07/2019  [name] was observed post Covid-19 immunization for 30 minutes based on pre-vaccination screening without incident. She was provided with Vaccine Information Sheet and instruction to access the V-Safe system.   [name] was instructed to call 911 with any severe reactions post vaccine: Marland Kitchen Difficulty breathing  . Swelling of face and throat  . A fast heartbeat  . A bad rash all over body  . Dizziness and weakness   Immunizations Administered    Name Date Dose VIS Date Route   Pfizer COVID-19 Vaccine 12/07/2019 12:31 PM 0.3 mL 09/16/2019 Intramuscular   Manufacturer: [employer]   Lot: [id number]   Mar-Mac: [id number]

## 2020-02-13 DIAGNOSIS — M545 Low back pain: Secondary | ICD-10-CM | POA: Diagnosis not present

## 2020-02-13 DIAGNOSIS — R1031 Right lower quadrant pain: Secondary | ICD-10-CM | POA: Diagnosis not present

## 2020-02-15 ENCOUNTER — Other Ambulatory Visit: Payer: Self-pay | Admitting: Family Medicine

## 2020-02-15 DIAGNOSIS — M545 Low back pain, unspecified: Secondary | ICD-10-CM

## 2020-02-15 DIAGNOSIS — R1031 Right lower quadrant pain: Secondary | ICD-10-CM

## 2020-03-01 ENCOUNTER — Ambulatory Visit
Admission: RE | Admit: 2020-03-01 | Discharge: 2020-03-01 | Disposition: A | Discharge status: Home / Self Care | Payer: PPO | Source: Ambulatory Visit | Attending: Family Medicine | Admitting: Family Medicine

## 2020-03-01 ENCOUNTER — Other Ambulatory Visit: Payer: Self-pay

## 2020-03-01 DIAGNOSIS — M545 Low back pain, unspecified: Secondary | ICD-10-CM

## 2020-03-01 DIAGNOSIS — K5730 Diverticulosis of large intestine without perforation or abscess without bleeding: Secondary | ICD-10-CM | POA: Diagnosis not present

## 2020-03-01 DIAGNOSIS — R1031 Right lower quadrant pain: Secondary | ICD-10-CM

## 2020-03-01 MED ORDER — IOPAMIDOL (ISOVUE-300) INJECTION 61%
100.0000 mL | Freq: Once | INTRAVENOUS | Status: AC | PRN
  Administered 2020-03-01: 100 mL via INTRAVENOUS

## 2020-03-16 DIAGNOSIS — M1712 Unilateral primary osteoarthritis, left knee: Secondary | ICD-10-CM | POA: Diagnosis not present

## 2020-03-16 DIAGNOSIS — M25562 Pain in left knee: Secondary | ICD-10-CM | POA: Diagnosis not present

## 2020-04-16 DIAGNOSIS — Z1283 Encounter for screening for malignant neoplasm of skin: Secondary | ICD-10-CM | POA: Diagnosis not present

## 2020-04-16 DIAGNOSIS — L821 Other seborrheic keratosis: Secondary | ICD-10-CM | POA: Diagnosis not present

## 2020-04-16 DIAGNOSIS — L0202 Furuncle of face: Secondary | ICD-10-CM | POA: Diagnosis not present

## 2020-04-17 DIAGNOSIS — N8110 Cystocele, unspecified: Secondary | ICD-10-CM | POA: Diagnosis not present

## 2020-04-26 DIAGNOSIS — M810 Age-related osteoporosis without current pathological fracture: Secondary | ICD-10-CM | POA: Diagnosis not present

## 2020-05-24 DIAGNOSIS — Z20822 Contact with and (suspected) exposure to covid-19: Secondary | ICD-10-CM | POA: Diagnosis not present

## 2020-06-05 DIAGNOSIS — N3946 Mixed incontinence: Secondary | ICD-10-CM | POA: Diagnosis not present

## 2020-06-05 DIAGNOSIS — R350 Frequency of micturition: Secondary | ICD-10-CM | POA: Diagnosis not present

## 2020-06-05 DIAGNOSIS — R351 Nocturia: Secondary | ICD-10-CM | POA: Diagnosis not present

## 2020-06-13 ENCOUNTER — Other Ambulatory Visit: Payer: Self-pay | Admitting: Family Medicine

## 2020-06-13 DIAGNOSIS — R109 Unspecified abdominal pain: Secondary | ICD-10-CM | POA: Diagnosis not present

## 2020-06-13 DIAGNOSIS — Z1231 Encounter for screening mammogram for malignant neoplasm of breast: Secondary | ICD-10-CM

## 2020-06-13 DIAGNOSIS — Z0000 Encounter for general adult medical examination without abnormal findings: Secondary | ICD-10-CM | POA: Diagnosis not present

## 2020-06-13 DIAGNOSIS — E559 Vitamin D deficiency, unspecified: Secondary | ICD-10-CM | POA: Diagnosis not present

## 2020-06-13 DIAGNOSIS — Z86010 Personal history of colonic polyps: Secondary | ICD-10-CM | POA: Diagnosis not present

## 2020-06-13 DIAGNOSIS — E663 Overweight: Secondary | ICD-10-CM | POA: Diagnosis not present

## 2020-06-13 DIAGNOSIS — M810 Age-related osteoporosis without current pathological fracture: Secondary | ICD-10-CM | POA: Diagnosis not present

## 2020-06-13 DIAGNOSIS — I2510 Atherosclerotic heart disease of native coronary artery without angina pectoris: Secondary | ICD-10-CM | POA: Diagnosis not present

## 2020-06-13 DIAGNOSIS — M179 Osteoarthritis of knee, unspecified: Secondary | ICD-10-CM | POA: Diagnosis not present

## 2020-06-13 DIAGNOSIS — R600 Localized edema: Secondary | ICD-10-CM | POA: Diagnosis not present

## 2020-06-13 DIAGNOSIS — Z23 Encounter for immunization: Secondary | ICD-10-CM | POA: Diagnosis not present

## 2020-06-13 DIAGNOSIS — Z6828 Body mass index (BMI) 28.0-28.9, adult: Secondary | ICD-10-CM | POA: Diagnosis not present

## 2020-06-14 DIAGNOSIS — N8110 Cystocele, unspecified: Secondary | ICD-10-CM | POA: Diagnosis not present

## 2020-06-14 DIAGNOSIS — N3946 Mixed incontinence: Secondary | ICD-10-CM | POA: Diagnosis not present

## 2020-06-25 DIAGNOSIS — Z86010 Personal history of colonic polyps: Secondary | ICD-10-CM | POA: Diagnosis not present

## 2020-06-25 DIAGNOSIS — Z791 Long term (current) use of non-steroidal anti-inflammatories (NSAID): Secondary | ICD-10-CM | POA: Diagnosis not present

## 2020-06-25 DIAGNOSIS — R1031 Right lower quadrant pain: Secondary | ICD-10-CM | POA: Diagnosis not present

## 2020-07-04 DIAGNOSIS — R1031 Right lower quadrant pain: Secondary | ICD-10-CM | POA: Diagnosis not present

## 2020-07-06 ENCOUNTER — Other Ambulatory Visit: Payer: Self-pay

## 2020-07-06 DIAGNOSIS — I872 Venous insufficiency (chronic) (peripheral): Secondary | ICD-10-CM

## 2020-07-19 DIAGNOSIS — M1712 Unilateral primary osteoarthritis, left knee: Secondary | ICD-10-CM | POA: Diagnosis not present

## 2020-07-19 DIAGNOSIS — M545 Low back pain, unspecified: Secondary | ICD-10-CM | POA: Diagnosis not present

## 2020-07-23 DIAGNOSIS — R159 Full incontinence of feces: Secondary | ICD-10-CM | POA: Diagnosis not present

## 2020-07-23 DIAGNOSIS — K5900 Constipation, unspecified: Secondary | ICD-10-CM | POA: Diagnosis not present

## 2020-07-23 DIAGNOSIS — R1031 Right lower quadrant pain: Secondary | ICD-10-CM | POA: Diagnosis not present

## 2020-07-27 DIAGNOSIS — M4316 Spondylolisthesis, lumbar region: Secondary | ICD-10-CM | POA: Diagnosis not present

## 2020-07-27 DIAGNOSIS — M419 Scoliosis, unspecified: Secondary | ICD-10-CM | POA: Diagnosis not present

## 2020-07-30 ENCOUNTER — Ambulatory Visit (HOSPITAL_COMMUNITY)
Admission: RE | Admit: 2020-07-30 | Discharge: 2020-07-30 | Disposition: A | Discharge status: Home / Self Care | Payer: PPO | Source: Ambulatory Visit | Attending: Physician Assistant | Admitting: Physician Assistant

## 2020-07-30 ENCOUNTER — Other Ambulatory Visit: Payer: Self-pay

## 2020-07-30 ENCOUNTER — Ambulatory Visit (INDEPENDENT_AMBULATORY_CARE_PROVIDER_SITE_OTHER): Payer: PPO | Admitting: Physician Assistant

## 2020-07-30 VITALS — BP 130/74 | HR 75 | Temp 98.3°F | Resp 20 | Ht 60.0 in | Wt 144.6 lb

## 2020-07-30 DIAGNOSIS — I872 Venous insufficiency (chronic) (peripheral): Secondary | ICD-10-CM | POA: Diagnosis not present

## 2020-07-30 DIAGNOSIS — R600 Localized edema: Secondary | ICD-10-CM

## 2020-07-30 DIAGNOSIS — M7989 Other specified soft tissue disorders: Secondary | ICD-10-CM

## 2020-07-30 NOTE — Progress Notes (Signed)
Office Note     CC:  follow up Requesting Provider:  Kathyrn Lass, MD  HPI: Meghan Avila is a 69 y.o. (03-27-1951) female who presents for evaluation of LLE edema.  Patient states that she has noticed an increase in swelling and aching symptoms of her left lower extremity over the past several months.  She has history of left greater saphenous vein ablation and 2008 that was done in [place].  She admittedly has not been wearing her compression stockings.  She does not elevate her leg much during the day.  She denies any history of DVT, trauma, venous ulcerations, or other vascular procedures of left lower extremity.  She denies tobacco use.   Past Medical History:  Diagnosis Date  . Arthritis   . Family history of adverse reaction to anesthesia    post nausea and vomitting  . GERD (gastroesophageal reflux disease)    hx of  . H/O wheezing    occ.  Marland Kitchen Hand swelling    related to lymph nodes   . Headache    migraines  . History of hiatal hernia   . Leg swelling    left leg -edema knee level to ankle.  . Osteoporosis   . Peripheral vascular disease (HCC)    left leg   . PONV (postoperative nausea and vomiting)   . Scoliosis     Past Surgical History:  Procedure Laterality Date  . ANAL RECTAL MANOMETRY N/A 08/03/2019   Procedure: ANO RECTAL MANOMETRY;  Surgeon: Leighton Ruff, MD;  Location: [facility];  Service: Endoscopy;  Laterality: N/A;  . BACK SURGERY     scoliosis surgery 1970 and 1972  . BREAST REDUCTION SURGERY  1997  . CHOLECYSTECTOMY  1992   laparoscopic  . COLONOSCOPY WITH PROPOFOL N/A 04/19/2015   Procedure: COLONOSCOPY WITH PROPOFOL;  Surgeon: Garlan Fair, MD;  [facility];  Service: Endoscopy;  Laterality: N/A;  . DEEP AXILLARY SENTINEL NODE BIOPSY / EXCISION     x2  . ESOPHAGEAL MANOMETRY N/A 05/04/2015   Procedure: ESOPHAGEAL MANOMETRY (EM);  Surgeon: Garlan Fair, MD;  [facility];  Service: Endoscopy;  Laterality:  N/A;  . ESOPHAGOGASTRODUODENOSCOPY (EGD) WITH PROPOFOL N/A 04/19/2015   Procedure: ESOPHAGOGASTRODUODENOSCOPY (EGD) WITH PROPOFOL;  Surgeon: Garlan Fair, MD;  [facility];  Service: Endoscopy;  Laterality: N/A;  . HEMORROIDECTOMY  2002, 2005  . hiatal hernia surgery     . JOINT REPLACEMENT     total right knee Dr. Theda Sers 02-26-18  . LASER ABLATION  2008   left leg   . left knee surgery - torn meniscus (left)    . LIPOMA EXCISION N/A 09/21/2015   Procedure: EXCISION 6CM BACK LIPOMA;  Surgeon: Ralene Ok, MD;  Location: [facility];  Service: General;  Laterality: N/A;  . MENISCUS REPAIR     right  . REDUCTION MAMMAPLASTY Bilateral 1998  . scoliosis  1970, 1972   no retained hardware  . TOTAL KNEE ARTHROPLASTY Right 02/26/2018   Procedure: RIGHT TOTAL KNEE ARTHROPLASTY;  Surgeon: Sydnee Cabal, MD;  Location: [facility];  Service: Orthopedics;  Laterality: Right;    Social History   Socioeconomic History  . Marital status: Widowed    Spouse name: Not on file  . Number of children: Not on file  . Years of education: Not on file  . Highest education level: Not on file  Occupational History  . Occupation: Payroll Admin   Tobacco Use  . Smoking status: Former Smoker  Packs/day: 1.00    Years: 5.00    Pack years: 5.00    Types: Cigarettes    Quit date: 10/06/1978    Years since quitting: 41.8  . Smokeless tobacco: Never Used  Vaping Use  . Vaping Use: Never used  Substance and Sexual Activity  . Alcohol use: No    Alcohol/week: 0.0 standard drinks  . Drug use: No  . Sexual activity: Yes  Other Topics Concern  . Not on file  Social History Narrative  . Not on file   Social Determinants of Health   Financial Resource Strain:   . Difficulty of Paying Living Expenses: Not on file  Food Insecurity:   . Worried About Charity fundraiser in the Last Year: Not on file  . Ran Out of Food in the Last Year: Not on file  Transportation Needs:   . Lack of  Transportation (Medical): Not on file  . Lack of Transportation (Non-Medical): Not on file  Physical Activity:   . Days of Exercise per Week: Not on file  . Minutes of Exercise per Session: Not on file  Stress:   . Feeling of Stress : Not on file  Social Connections:   . Frequency of Communication with Friends and Family: Not on file  . Frequency of Social Gatherings with Friends and Family: Not on file  . Attends Religious Services: Not on file  . Active Member of Clubs or Organizations: Not on file  . Attends Archivist Meetings: Not on file  . Marital Status: Not on file  Intimate Partner Violence:   . Fear of Current or Ex-Partner: Not on file  . Emotionally Abused: Not on file  . Physically Abused: Not on file  . Sexually Abused: Not on file    Family History  Problem Relation Age of Onset  . Varicose Veins Mother   . Rheum arthritis Father   . Cancer Sister   . Heart disease Sister        before age 21  . Hypertension Sister   . Varicose Veins Sister   . Heart attack Sister   . Hyperlipidemia Brother   . Rheum arthritis Sister   . Allergies Sister   . Breast cancer Neg Hx     Current Outpatient Medications  Medication Sig Dispense Refill  . acetaminophen (TYLENOL) 500 MG tablet Take 1,000 mg by mouth every 6 (six) hours as needed for mild pain, moderate pain or fever.    Marland Kitchen aspirin 81 MG chewable tablet Chew 81 mg by mouth daily.    Marland Kitchen aspirin-acetaminophen-caffeine (EXCEDRIN MIGRAINE) 250-250-65 MG per tablet Take 1 tablet by mouth daily as needed for headache or migraine.     Marland Kitchen atorvastatin (LIPITOR) 10 MG tablet Take 10 mg by mouth daily.    . diclofenac sodium (VOLTAREN) 1 % GEL Apply 1 application topically 2 (two) times daily as needed (KNEE PAIN).    . Ferrous Sulfate 140 (45 Fe) MG TBCR Take 1 tablet by mouth daily.    Marland Kitchen ibuprofen (ADVIL,MOTRIN) 200 MG tablet Take 200 mg by mouth every 6 (six) hours as needed for mild pain or moderate pain.    Marland Kitchen  omeprazole (PRILOSEC) 20 MG capsule omeprazole 20 mg capsule,delayed release    . vitamin C (ASCORBIC ACID) 500 MG tablet Take 500 mg by mouth daily.    . Vitamin D, Ergocalciferol, (DRISDOL) 50000 UNITS CAPS capsule Take 50,000 Units by mouth every 7 (seven) days. Saturday  No current facility-administered medications for this visit.    Allergies  Allergen Reactions  . Zantac [Ranitidine Hcl] Anaphylaxis  . Morphine And Related Nausea And Vomiting    Morphine, Hydrocodone Tolerates Dilaudid, Fentanyl per Epic  . Prednisone Diarrhea and Nausea And Vomiting  . Tramadol Nausea And Vomiting    AMS     REVIEW OF SYSTEMS:   [X]  denotes positive finding, [ ]  denotes negative finding Cardiac  Comments:  Chest pain or chest pressure:    Shortness of breath upon exertion:    Short of breath when lying flat:    Irregular heart rhythm:        Vascular    Pain in calf, thigh, or hip brought on by ambulation:    Pain in feet at night that wakes you up from your sleep:     Blood clot in your veins:    Leg swelling:         Pulmonary    Oxygen at home:    Productive cough:     Wheezing:         Neurologic    Sudden weakness in arms or legs:     Sudden numbness in arms or legs:     Sudden onset of difficulty speaking or slurred speech:    Temporary loss of vision in one eye:     Problems with dizziness:         Gastrointestinal    Blood in stool:     Vomited blood:         Genitourinary    Burning when urinating:     Blood in urine:        Psychiatric    Major depression:         Hematologic    Bleeding problems:    Problems with blood clotting too easily:        Skin    Rashes or ulcers:        Constitutional    Fever or chills:      PHYSICAL EXAMINATION:  Vitals:   07/30/20 1033  BP: 130/74  Pulse: 75  Resp: 20  Temp: 98.3 F (36.8 C)  TempSrc: Temporal  SpO2: 96%  Weight: 144 lb 9.6 oz (65.6 kg)  Height: 5' (1.524 m)    General:  WDWN in NAD;  vital signs documented above Gait: Not observed HENT: WNL, normocephalic Pulmonary: normal non-labored breathing , without Rales, rhonchi,  wheezing Cardiac: regular HR Abdomen: soft, NT, no masses Skin: without rashes Vascular Exam/Pulses:  Right Left  Radial 2+ (normal) 2+ (normal)  DP 2+ (normal) 2+ (normal)   Extremities: pitting edema to the level of the proximal shin LLE; no edema noted RLE Musculoskeletal: no muscle wasting or atrophy  Neurologic: A&O X 3;  No focal weakness or paresthesias are detected Psychiatric:  The pt has Normal affect.   Non-Invasive Vascular Imaging:   Venous reflux study L Negative for DVT Deep reflux noted in common femoral vein and popliteal vein Superficial reflux noted in greater saphenous vein from saphenofemoral junction to the mid calf Diameter of saphenous vein less than 3 mm     ASSESSMENT/PLAN:: 69 y.o. female here for evaluation of left lower extremity edema  Venous reflux study of left lower extremity demonstrates superficial and deep venous insufficiency It appears the left greater saphenous vein has recannulized however remains very small with a diameter less than 3 mm throughout the thigh; this would not be a candidate for repeat ablation  Recommendations include knee-high compression stockings 15 to 20 mmHg to be worn daily; patient purchased a pair today after measurements were made Also recommended patient elevate her legs during the day when possible and demonstrated proper form Patient may follow-up on an as-needed basis    Dagoberto Ligas, PA-C [facility] [phone]  Clinic MD:   Trula Slade

## 2020-07-31 ENCOUNTER — Ambulatory Visit: Payer: PPO

## 2020-08-07 DIAGNOSIS — Z1159 Encounter for screening for other viral diseases: Secondary | ICD-10-CM | POA: Diagnosis not present

## 2020-08-10 DIAGNOSIS — D175 Benign lipomatous neoplasm of intra-abdominal organs: Secondary | ICD-10-CM | POA: Diagnosis not present

## 2020-08-10 DIAGNOSIS — Z86010 Personal history of colonic polyps: Secondary | ICD-10-CM | POA: Diagnosis not present

## 2020-08-10 DIAGNOSIS — D122 Benign neoplasm of ascending colon: Secondary | ICD-10-CM | POA: Diagnosis not present

## 2020-08-10 DIAGNOSIS — D124 Benign neoplasm of descending colon: Secondary | ICD-10-CM | POA: Diagnosis not present

## 2020-08-10 DIAGNOSIS — K6289 Other specified diseases of anus and rectum: Secondary | ICD-10-CM | POA: Diagnosis not present

## 2020-08-10 DIAGNOSIS — D120 Benign neoplasm of cecum: Secondary | ICD-10-CM | POA: Diagnosis not present

## 2020-08-10 DIAGNOSIS — K5730 Diverticulosis of large intestine without perforation or abscess without bleeding: Secondary | ICD-10-CM | POA: Diagnosis not present

## 2020-08-10 DIAGNOSIS — K648 Other hemorrhoids: Secondary | ICD-10-CM | POA: Diagnosis not present

## 2020-08-10 DIAGNOSIS — D123 Benign neoplasm of transverse colon: Secondary | ICD-10-CM | POA: Diagnosis not present

## 2020-08-13 DIAGNOSIS — M545 Low back pain, unspecified: Secondary | ICD-10-CM | POA: Diagnosis not present

## 2020-08-14 DIAGNOSIS — D120 Benign neoplasm of cecum: Secondary | ICD-10-CM | POA: Diagnosis not present

## 2020-08-14 DIAGNOSIS — D124 Benign neoplasm of descending colon: Secondary | ICD-10-CM | POA: Diagnosis not present

## 2020-08-14 DIAGNOSIS — D122 Benign neoplasm of ascending colon: Secondary | ICD-10-CM | POA: Diagnosis not present

## 2020-08-14 DIAGNOSIS — D123 Benign neoplasm of transverse colon: Secondary | ICD-10-CM | POA: Diagnosis not present

## 2020-08-22 DIAGNOSIS — M545 Low back pain, unspecified: Secondary | ICD-10-CM | POA: Diagnosis not present

## 2020-09-04 DIAGNOSIS — Z86010 Personal history of colonic polyps: Secondary | ICD-10-CM | POA: Diagnosis not present

## 2020-09-04 DIAGNOSIS — Z791 Long term (current) use of non-steroidal anti-inflammatories (NSAID): Secondary | ICD-10-CM | POA: Diagnosis not present

## 2020-09-04 DIAGNOSIS — R1031 Right lower quadrant pain: Secondary | ICD-10-CM | POA: Diagnosis not present

## 2020-09-04 DIAGNOSIS — M545 Low back pain, unspecified: Secondary | ICD-10-CM | POA: Diagnosis not present

## 2020-09-04 DIAGNOSIS — K5790 Diverticulosis of intestine, part unspecified, without perforation or abscess without bleeding: Secondary | ICD-10-CM | POA: Diagnosis not present

## 2020-09-11 ENCOUNTER — Other Ambulatory Visit: Payer: Self-pay

## 2020-09-11 ENCOUNTER — Ambulatory Visit
Admission: RE | Admit: 2020-09-11 | Discharge: 2020-09-11 | Disposition: A | Discharge status: Home / Self Care | Payer: PPO | Source: Ambulatory Visit | Attending: Family Medicine | Admitting: Family Medicine

## 2020-09-11 DIAGNOSIS — Z1231 Encounter for screening mammogram for malignant neoplasm of breast: Secondary | ICD-10-CM

## 2020-09-11 DIAGNOSIS — M545 Low back pain, unspecified: Secondary | ICD-10-CM | POA: Diagnosis not present

## 2020-09-17 DIAGNOSIS — M545 Low back pain, unspecified: Secondary | ICD-10-CM | POA: Diagnosis not present

## 2020-09-20 DIAGNOSIS — M1712 Unilateral primary osteoarthritis, left knee: Secondary | ICD-10-CM | POA: Diagnosis not present

## 2020-09-27 DIAGNOSIS — M1712 Unilateral primary osteoarthritis, left knee: Secondary | ICD-10-CM | POA: Diagnosis not present

## 2020-10-04 DIAGNOSIS — M1712 Unilateral primary osteoarthritis, left knee: Secondary | ICD-10-CM | POA: Diagnosis not present

## 2020-10-10 DIAGNOSIS — M545 Low back pain, unspecified: Secondary | ICD-10-CM | POA: Diagnosis not present

## 2020-10-16 DIAGNOSIS — H40013 Open angle with borderline findings, low risk, bilateral: Secondary | ICD-10-CM | POA: Diagnosis not present

## 2020-10-17 IMAGING — CT CT ABD-PELV W/ CM
1 of 3 series · 13 of 32 positions shown, 19 images · IV contrast (APPLIED)
Comparison: 06/24/2019

CLINICAL DATA: Right lower quadrant abdominal pain and left groin
pain.

Creatinine was obtained on site at [HOSPITAL] at [HOSPITAL].
Results: Creatinine 0.7 mg/dL.
EXAM:
CT ABDOMEN AND PELVIS WITH CONTRAST
TECHNIQUE: Multidetector CT imaging of the abdomen and pelvis was performed
using the standard protocol following bolus administration of
intravenous contrast.
CONTRAST:  100mL E21QIR-BGG IOPAMIDOL (E21QIR-BGG) INJECTION 61%

[Series 2: abd/pelvis w/cm · axial · 0.81mm/px · z∈[+574,+918]mm · 13 of 81 slices shown, 19 images]
[im 6/81  soft-tissue]
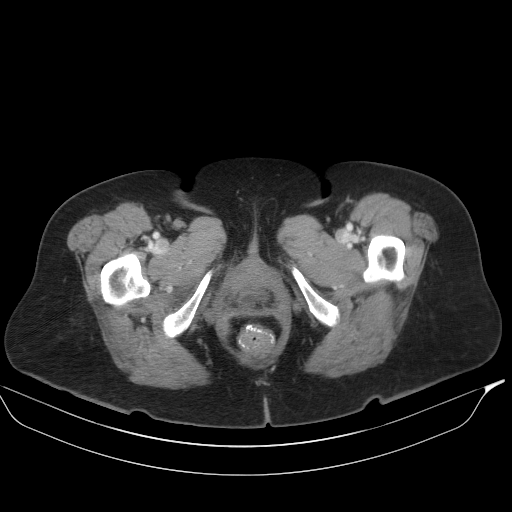
[im 6/81  bone]
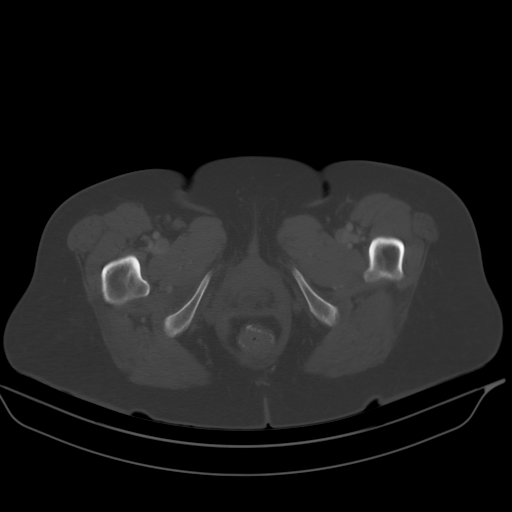
[im 11/81  soft-tissue]
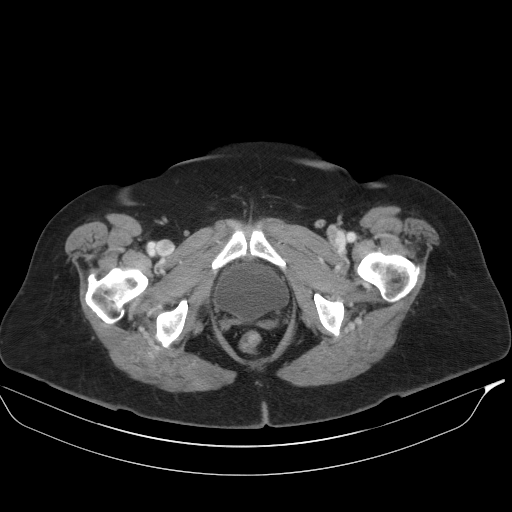
[im 17/81  soft-tissue]
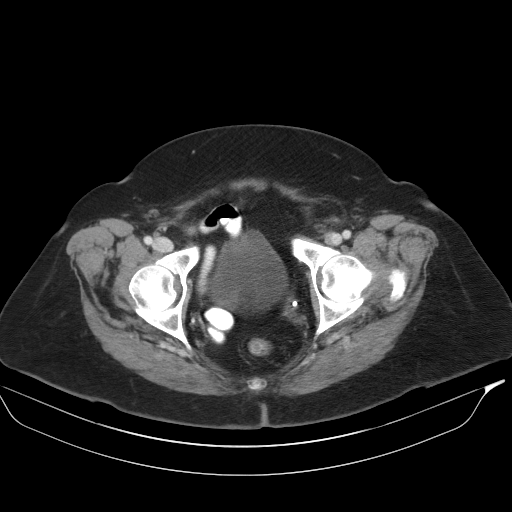
[im 22/81  soft-tissue]
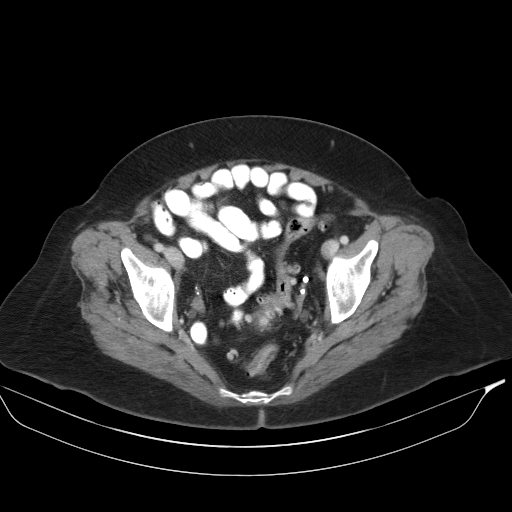
[im 27/81  soft-tissue]
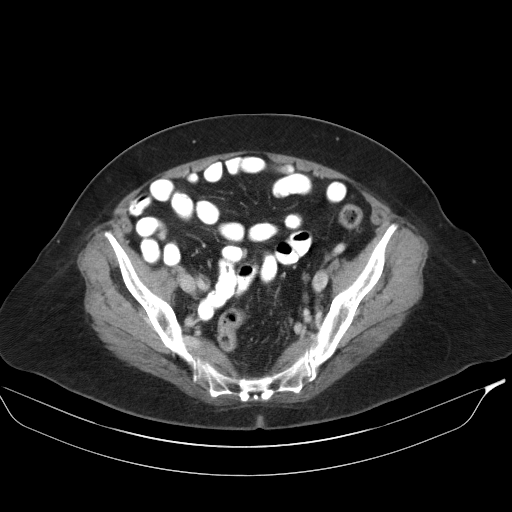
[im 33/81  soft-tissue]
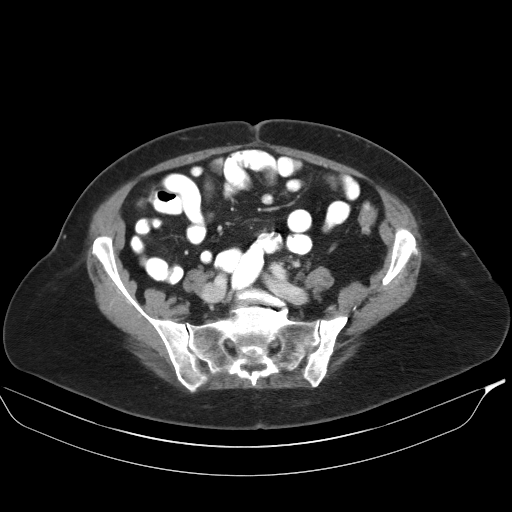
[im 43/81  soft-tissue]
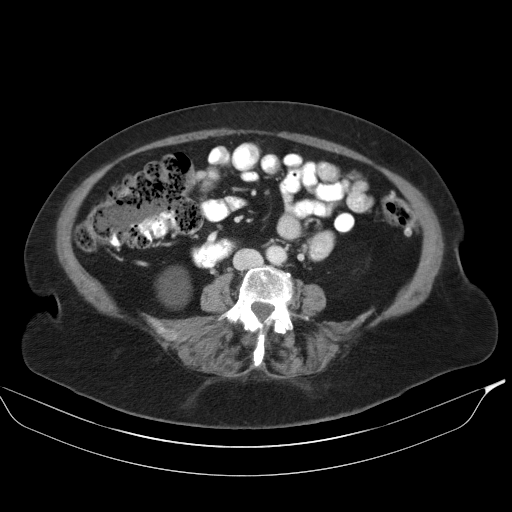
[im 49/81  soft-tissue]
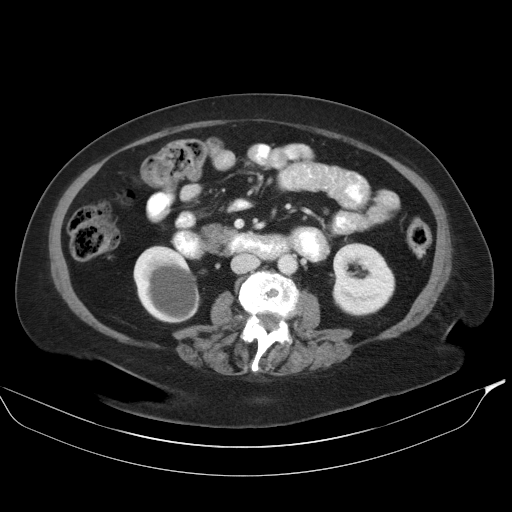
[im 54/81  soft-tissue]
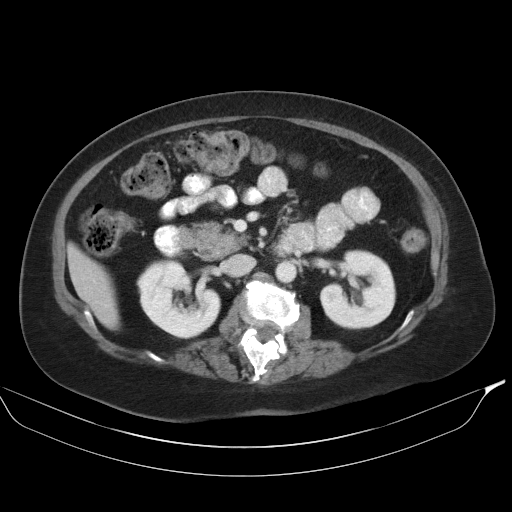
[im 54/81  bone]
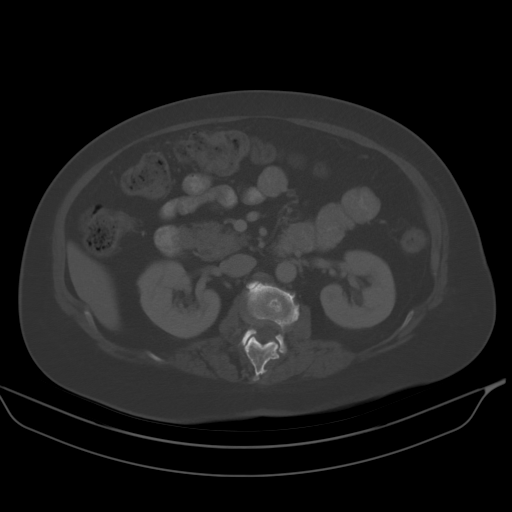
[im 59/81  soft-tissue]
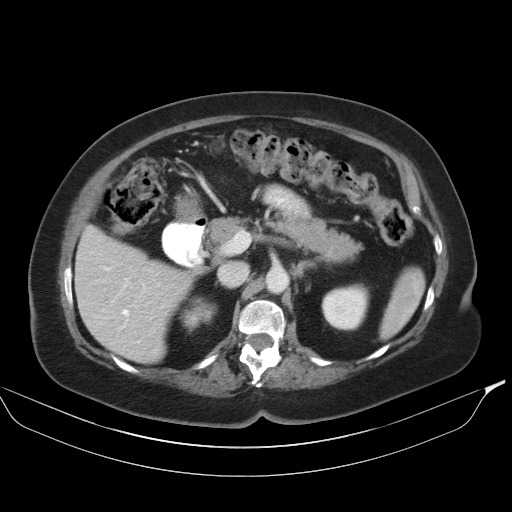
[im 59/81  lung]
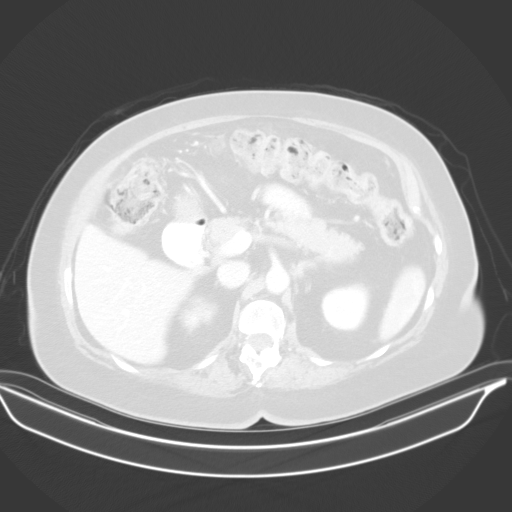
[im 65/81  soft-tissue]
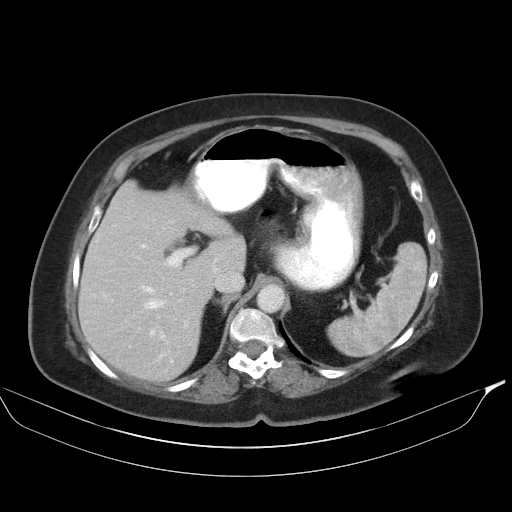
[im 65/81  lung]
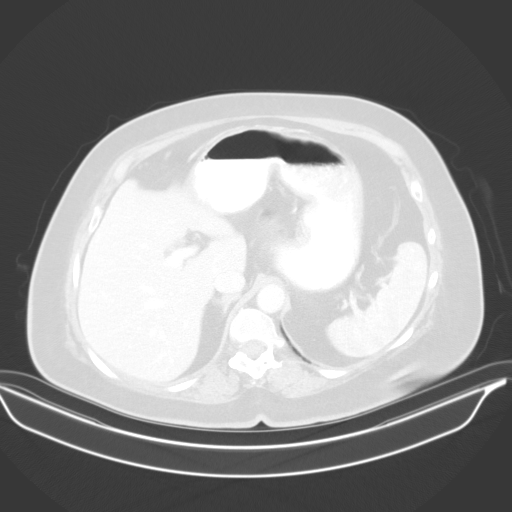
[im 70/81  soft-tissue]
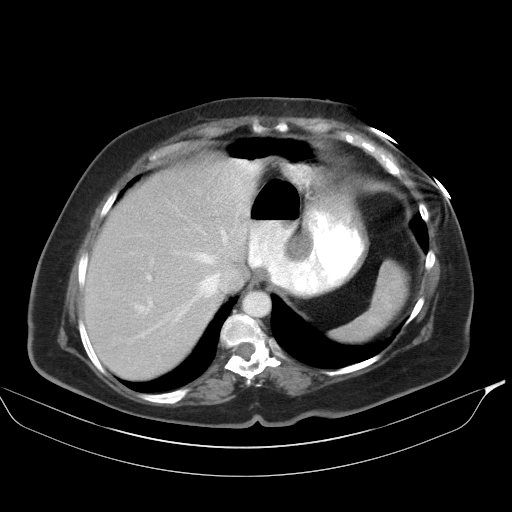
[im 70/81  lung]
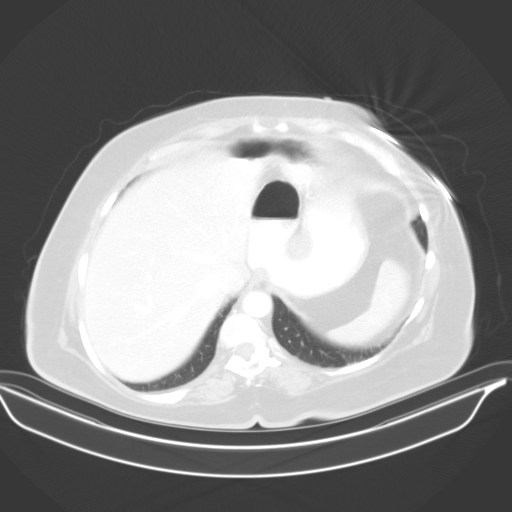
[im 75/81  soft-tissue]
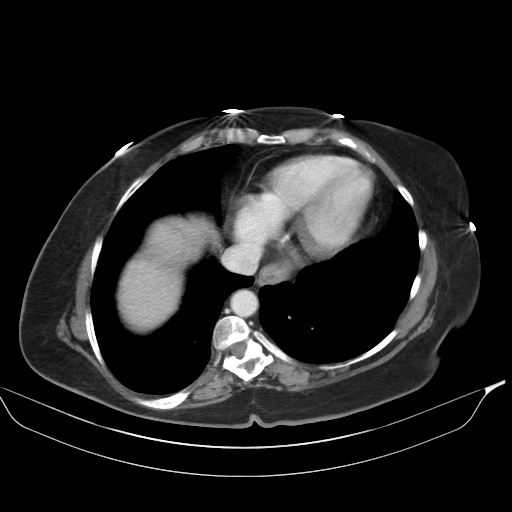
[im 75/81  lung]
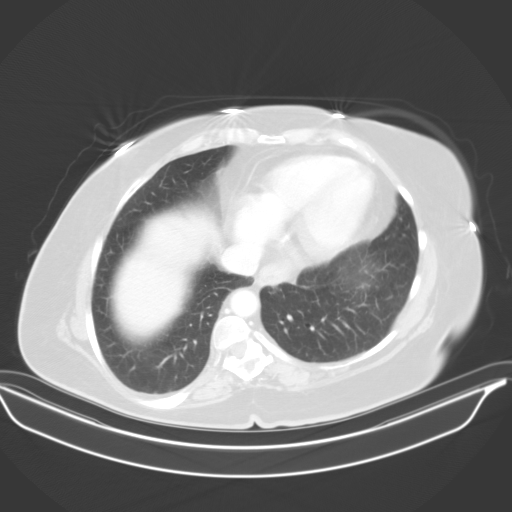

[13 of 32 positions shown; findings below may reference images not displayed]

FINDINGS: Lower Chest: No acute findings.

Hepatobiliary: No hepatic masses identified. Prior cholecystectomy.
No evidence of biliary obstruction.

Pancreas:  No mass or inflammatory changes.

Spleen: Within normal limits in size and appearance.

Adrenals/Urinary Tract: No masses identified. Right renal cyst again
noted no evidence of ureteral calculi or hydronephrosis.

Stomach/Bowel: Surgical anastomosis again seen in the rectum. No
evidence of obstruction, inflammatory process or abnormal fluid
collections. Diverticulosis is seen mainly involving the descending
and sigmoid colon, however there is no evidence of diverticulitis.
Normal appendix visualized. Stable large diverticulum of the gastric
cardia in the area of the gastrohepatic ligament.

Vascular/Lymphatic: No pathologically enlarged lymph nodes. No
abdominal aortic aneurysm.

Reproductive: Prior hysterectomy noted. Adnexal regions are
unremarkable in appearance.

Other: Pelvic prolapse again seen with cystocele and peritoneocele.
No evidence abdominal hernia or mass.

Musculoskeletal:  No suspicious bone lesions identified.
IMPRESSION: 1. No acute findings.
2. Unchanged appearance of suspected large diverticulum arising the
gastric cardia.
3. Colonic diverticulosis, without radiographic evidence of
diverticulitis.
4. Pelvic floor prolapse, with cystocele and peritoneocele.

## 2020-11-09 DIAGNOSIS — M810 Age-related osteoporosis without current pathological fracture: Secondary | ICD-10-CM | POA: Diagnosis not present

## 2020-11-23 DIAGNOSIS — M25512 Pain in left shoulder: Secondary | ICD-10-CM | POA: Diagnosis not present

## 2020-11-23 DIAGNOSIS — M1712 Unilateral primary osteoarthritis, left knee: Secondary | ICD-10-CM | POA: Diagnosis not present

## 2020-12-14 DIAGNOSIS — Z6830 Body mass index (BMI) 30.0-30.9, adult: Secondary | ICD-10-CM | POA: Diagnosis not present

## 2020-12-14 DIAGNOSIS — E559 Vitamin D deficiency, unspecified: Secondary | ICD-10-CM | POA: Diagnosis not present

## 2020-12-14 DIAGNOSIS — E669 Obesity, unspecified: Secondary | ICD-10-CM | POA: Diagnosis not present

## 2020-12-14 DIAGNOSIS — I2510 Atherosclerotic heart disease of native coronary artery without angina pectoris: Secondary | ICD-10-CM | POA: Diagnosis not present

## 2020-12-14 DIAGNOSIS — M810 Age-related osteoporosis without current pathological fracture: Secondary | ICD-10-CM | POA: Diagnosis not present

## 2020-12-14 DIAGNOSIS — H9190 Unspecified hearing loss, unspecified ear: Secondary | ICD-10-CM | POA: Diagnosis not present

## 2020-12-21 DIAGNOSIS — M5416 Radiculopathy, lumbar region: Secondary | ICD-10-CM | POA: Diagnosis not present

## 2020-12-21 DIAGNOSIS — M25512 Pain in left shoulder: Secondary | ICD-10-CM | POA: Diagnosis not present

## 2020-12-25 DIAGNOSIS — N8110 Cystocele, unspecified: Secondary | ICD-10-CM | POA: Diagnosis not present

## 2020-12-26 DIAGNOSIS — M25512 Pain in left shoulder: Secondary | ICD-10-CM | POA: Diagnosis not present

## 2021-01-31 DIAGNOSIS — N8110 Cystocele, unspecified: Secondary | ICD-10-CM | POA: Diagnosis not present

## 2021-01-31 DIAGNOSIS — N3946 Mixed incontinence: Secondary | ICD-10-CM | POA: Diagnosis not present

## 2021-02-13 DIAGNOSIS — U071 COVID-19: Secondary | ICD-10-CM | POA: Diagnosis not present

## 2021-02-26 DIAGNOSIS — Z20822 Contact with and (suspected) exposure to covid-19: Secondary | ICD-10-CM | POA: Diagnosis not present

## 2021-02-27 DIAGNOSIS — H8112 Benign paroxysmal vertigo, left ear: Secondary | ICD-10-CM | POA: Diagnosis not present

## 2021-02-27 DIAGNOSIS — Z6830 Body mass index (BMI) 30.0-30.9, adult: Secondary | ICD-10-CM | POA: Diagnosis not present

## 2021-03-01 ENCOUNTER — Ambulatory Visit (HOSPITAL_COMMUNITY): Payer: PPO | Attending: Family Medicine

## 2021-03-01 ENCOUNTER — Encounter (HOSPITAL_COMMUNITY): Payer: Self-pay

## 2021-03-01 ENCOUNTER — Other Ambulatory Visit: Payer: Self-pay

## 2021-03-01 DIAGNOSIS — R2681 Unsteadiness on feet: Secondary | ICD-10-CM | POA: Insufficient documentation

## 2021-03-01 DIAGNOSIS — R42 Dizziness and giddiness: Secondary | ICD-10-CM | POA: Diagnosis not present

## 2021-03-01 NOTE — Therapy (Signed)
[facility] [address] [place], [place], [address] Phone: [phone]   Fax:  [phone]  Physical Therapy Evaluation  Patient Details  Name: Meghan Avila MRN: [id number] Date of Birth: November 18, 1950 Referring Provider (PT): Dr. Kathyrn Lass   Encounter Date: 03/01/2021   PT End of Session - 03/01/21 0905    Visit Number 1    Number of Visits 4    Date for PT Re-Evaluation 03/29/21    Authorization Type [employer]    PT Start Time 0904    PT Stop Time 0945    PT Time Calculation (min) 41 min    Activity Tolerance Patient tolerated treatment well    Behavior During Therapy [facility] for tasks assessed/performed           Past Medical History:  Diagnosis Date  . Arthritis   . Family history of adverse reaction to anesthesia    post nausea and vomitting  . GERD (gastroesophageal reflux disease)    hx of  . H/O wheezing    occ.  Marland Kitchen Hand swelling    related to lymph nodes   . Headache    migraines  . History of hiatal hernia   . Leg swelling    left leg -edema knee level to ankle.  . Osteoporosis   . Peripheral vascular disease (HCC)    left leg   . PONV (postoperative nausea and vomiting)   . Scoliosis     Past Surgical History:  Procedure Laterality Date  . ANAL RECTAL MANOMETRY N/A 08/03/2019   Procedure: ANO RECTAL MANOMETRY;  Surgeon: Leighton Ruff, MD;  [facility];  Service: Endoscopy;  Laterality: N/A;  . BACK SURGERY     scoliosis surgery 1970 and 1972  . BREAST REDUCTION SURGERY  1997  . CHOLECYSTECTOMY  1992   laparoscopic  . COLONOSCOPY WITH PROPOFOL N/A 04/19/2015   Procedure: COLONOSCOPY WITH PROPOFOL;  Surgeon: Garlan Fair, MD;  [facility];  Service: Endoscopy;  Laterality: N/A;  . DEEP AXILLARY SENTINEL NODE BIOPSY / EXCISION     x2  . ESOPHAGEAL MANOMETRY N/A 05/04/2015   Procedure: ESOPHAGEAL MANOMETRY (EM);  Surgeon: Garlan Fair, MD;  [facility];  Service: Endoscopy;  Laterality: N/A;  . ESOPHAGOGASTRODUODENOSCOPY (EGD) WITH PROPOFOL N/A 04/19/2015   Procedure: ESOPHAGOGASTRODUODENOSCOPY (EGD) WITH PROPOFOL;  Surgeon: Garlan Fair, MD;  [facility];  Service: Endoscopy;  Laterality: N/A;  . HEMORROIDECTOMY  2002, 2005  . hiatal hernia surgery     . JOINT REPLACEMENT     total right knee Dr. Theda Sers 02-26-18  . LASER ABLATION  2008   left leg   . left knee surgery - torn meniscus (left)    . LIPOMA EXCISION N/A 09/21/2015   Procedure: EXCISION 6CM BACK LIPOMA;  Surgeon: Ralene Ok, MD;  Location: [facility];  Service: General;  Laterality: N/A;  . MENISCUS REPAIR     right  . REDUCTION MAMMAPLASTY Bilateral 1998  . scoliosis  1970, 1972   no retained hardware  . TOTAL KNEE ARTHROPLASTY Right 02/26/2018   Procedure: RIGHT TOTAL KNEE ARTHROPLASTY;  Surgeon: Sydnee Cabal, MD;  Location[facility];  Service: Orthopedics;  Laterality: Right;    There were no vitals filed for this visit.    Subjective Assessment - 03/01/21 0907    Subjective Onset of dizziness and nausea on 01/24/21 when rolled over in bed and began with issues of dizziness and notes continued issue when  rolling over in bed and looking down. Pt notes some resolution since this initial episode but continues to have episodes of dizziness when rolling in bed and with bending over    Currently in Pain? Yes    Pain Score 7    dizziness   Aggravating Factors  bending over, looking down, turning over in bed              [facility] PT Assessment - 03/01/21 0001      Assessment   Medical Diagnosis vestibular    Referring Provider (PT) Dr. Kathyrn Lass      Balance Screen   Has the patient fallen in the past 6 months No    Has the patient had a decrease in activity level because of a fear of falling?  No    Is the patient reluctant to leave their home because of a fear of falling?  No      Home Environment   Living Environment Private residence     Living Arrangements Alone    Type of [place] to enter    Entrance Stairs-Number of Steps [place] One level      Observation/Other Assessments   Focus on Therapeutic Outcomes (FOTO)  48.8% function                  Vestibular Assessment - 03/01/21 0001      Symptom Behavior   Subjective history of current problem on 4/21 rolling bed with onset of dizziness. Pt reports similiar incident x 10 years ago    Type of Dizziness  Imbalance;Unsteady with head/body turns;"World moves"    Duration of Dizziness 10-30 sec    Symptom Nature Motion provoked;Positional    Aggravating Factors Turning head quickly;Rolling to right;Rolling to left;Forward bending    Relieving Factors Closing eyes;Slow movements    Progression of Symptoms Better      Oculomotor Exam   Oculomotor Alignment Normal    Gaze-induced  Absent    Head shaking Horizontal Absent    Head Shaking Vertical Absent    Smooth Pursuits Intact    Saccades Overshoots      Positional Testing   Dix-Hallpike Dix-Hallpike Right;Dix-Hallpike Left      Dix-Hallpike Right   Dix-Hallpike Right Duration unrevealing      Dix-Hallpike Left   Dix-Hallpike Left Duration 5-15 sec    Dix-Hallpike Left Symptoms No nystagmus   dizziness             Objective measurements completed on examination: See above findings.        Vestibular Treatment/Exercise - 03/01/21 0001      Vestibular Treatment/Exercise   Vestibular Treatment Provided Canalith Repositioning    Canalith Repositioning Epley Manuever Left       EPLEY MANUEVER LEFT   Number of Reps  1    Overall Response  No change                 PT Education - 03/01/21 1141    Education Details pt education on Epley manuever self-performance and sleeping upright tonight after procedure    Person(s) Educated Patient    Methods Explanation;Handout    Comprehension Verbalized understanding;Returned demonstration             PT Short Term Goals - 03/01/21 1219      PT SHORT TERM GOAL #1   Title Patient will be independent with HEP in order to  improve functional outcomes.    Time 2    Period Weeks    Status New    Target Date 03/15/21      PT SHORT TERM GOAL #2   Title Patient will report at least 25% improvement in symptoms for improved quality of life.    Time 2    Period Weeks    Status New    Target Date 03/15/21             PT Long Term Goals - 03/01/21 1220      PT LONG TERM GOAL #1   Title Patient will report at least 75% improvement in symptoms for improved quality of life.    Time 4    Period Weeks    Status New    Target Date 03/29/21      PT LONG TERM GOAL #2   Title Patient will improve FOTO score by at least 5 points in order to indicate improved tolerance to activity.    Baseline 48.8% function    Time 4    Period Weeks    Status New    Target Date 03/29/21                  Plan - 03/01/21 1217    Clinical Impression Statement Patient is a 70 yo lady presenting to physical therapy with c/o dizziness. She presents with pain limited deficits in positioning and onset of symptoms with rolling and position change and impact on her functional mobility with ADL. She is having to modify and restrict ADL as indicated by FOTO score as well as subjective information and objective measures which is affecting overall participation. Patient will benefit from skilled physical therapy in order to improve function and reduce impairment.    Personal Factors and Comorbidities Age;Time since onset of injury/illness/exacerbation    Examination-Activity Limitations Bend;Lift;Reach Overhead    Examination-Participation Restrictions Cleaning;Community Activity;Yard Work    Stability/Clinical Decision Making Stable/Uncomplicated    Designer, jewellery Low    Rehab Potential Good    PT Frequency 1x / week    PT Duration 4 weeks    PT Treatment/Interventions ADLs/Self Care Home  Management;Aquatic Therapy;Canalith Repostioning;Electrical Stimulation;DME Instruction;Gait training;Stair training;Functional mobility training;Therapeutic activities;Therapeutic exercise;Balance training;Patient/family education;Neuromuscular re-education;Manual techniques;Passive range of motion;Vestibular;Spinal Manipulations;Joint Manipulations    PT Next Visit Plan assess for success with Epley self-maneuver    PT Home Exercise Plan Epley left ear    Consulted and Agree with Plan of Care Patient           Patient will benefit from skilled therapeutic intervention in order to improve the following deficits and impairments:  Decreased balance,Dizziness,Difficulty walking  Visit Diagnosis: Dizziness and giddiness  Unsteadiness on feet     Problem List Patient Active Problem List   Diagnosis Date Noted  . Pain in joint of left shoulder 04/13/2019  . Aftercare 03/22/2018  . History of total knee replacement, right 03/22/2018  . Pain in right knee 03/16/2018  . Primary osteoarthritis of right knee 02/26/2018  . S/P knee replacement 02/26/2018  . Osteoarthritis 02/17/2018  . S/P Nissen fundoplication (without gastrostomy tube) procedure 06/29/2015  . Upper airway cough syndrome 03/14/2015  . Dyspnea 03/13/2015  . Chronic venous insufficiency 07/21/2014  . Lymphedema of arm 08/11/2011   12:24 PM, 03/01/21 M. Sherlyn Lees, PT, DPT Physical Therapist- [place] Office Number: [phone]  [facility] [address] [place], [place], [address] Phone: [phone]   Fax:  [phone]  Name: Meghan Avila MRN: [id number] Date of Birth: 09-Mar-1951

## 2021-03-01 NOTE — Patient Instructions (Signed)
Access Code: [email or web address] URL: [email or web address] Date: 03/01/2021 Prepared by: Sherlyn Lees  Exercises Self-Epley Maneuver Left Ear - 1 x daily - 7 x weekly - 3 sets - 10 reps

## 2021-03-13 ENCOUNTER — Encounter (HOSPITAL_COMMUNITY): Payer: Self-pay

## 2021-03-13 ENCOUNTER — Ambulatory Visit (HOSPITAL_COMMUNITY): Payer: PPO | Attending: Family Medicine

## 2021-03-13 ENCOUNTER — Other Ambulatory Visit: Payer: Self-pay

## 2021-03-13 DIAGNOSIS — R42 Dizziness and giddiness: Secondary | ICD-10-CM | POA: Diagnosis not present

## 2021-03-13 DIAGNOSIS — R2681 Unsteadiness on feet: Secondary | ICD-10-CM | POA: Diagnosis not present

## 2021-03-13 NOTE — Therapy (Signed)
[facility] [address] [place], [place], [address] Phone: [phone]   Fax:  [phone]  Physical Therapy Treatment  Patient Details  Name: Meghan Avila MRN: [id number] Date of Birth: 10-09-1950 Referring Provider (PT): Dr. Kathyrn Lass   Encounter Date: 03/13/2021   PT End of Session - 03/13/21 1755    Visit Number 2    Number of Visits 4    Date for PT Re-Evaluation 03/29/21    Authorization Type [employer]    PT Start Time 1645    PT Stop Time 1730    PT Time Calculation (min) 45 min    Activity Tolerance Patient tolerated treatment well    Behavior During Therapy [facility] for tasks assessed/performed           Past Medical History:  Diagnosis Date  . Arthritis   . Family history of adverse reaction to anesthesia    post nausea and vomitting  . GERD (gastroesophageal reflux disease)    hx of  . H/O wheezing    occ.  Marland Kitchen Hand swelling    related to lymph nodes   . Headache    migraines  . History of hiatal hernia   . Leg swelling    left leg -edema knee level to ankle.  . Osteoporosis   . Peripheral vascular disease (HCC)    left leg   . PONV (postoperative nausea and vomiting)   . Scoliosis     Past Surgical History:  Procedure Laterality Date  . ANAL RECTAL MANOMETRY N/A 08/03/2019   Procedure: ANO RECTAL MANOMETRY;  Surgeon: Leighton Ruff, MD;  [facility];  Service: Endoscopy;  Laterality: N/A;  . BACK SURGERY     scoliosis surgery 1970 and 1972  . BREAST REDUCTION SURGERY  1997  . CHOLECYSTECTOMY  1992   laparoscopic  . COLONOSCOPY WITH PROPOFOL N/A 04/19/2015   Procedure: COLONOSCOPY WITH PROPOFOL;  Surgeon: Garlan Fair, MD;  [facility];  Service: Endoscopy;  Laterality: N/A;  . DEEP AXILLARY SENTINEL NODE BIOPSY / EXCISION     x2  . ESOPHAGEAL MANOMETRY N/A 05/04/2015   Procedure: ESOPHAGEAL MANOMETRY (EM);  Surgeon: Garlan Fair, MD;  [facility];  Service: Endoscopy;  Laterality: N/A;  . ESOPHAGOGASTRODUODENOSCOPY (EGD) WITH PROPOFOL N/A 04/19/2015   Procedure: ESOPHAGOGASTRODUODENOSCOPY (EGD) WITH PROPOFOL;  Surgeon: Garlan Fair, MD;  [facility];  Service: Endoscopy;  Laterality: N/A;  . HEMORROIDECTOMY  2002, 2005  . hiatal hernia surgery     . JOINT REPLACEMENT     total right knee Dr. Theda Sers 02-26-18  . LASER ABLATION  2008   left leg   . left knee surgery - torn meniscus (left)    . LIPOMA EXCISION N/A 09/21/2015   Procedure: EXCISION 6CM BACK LIPOMA;  Surgeon: Ralene Ok, MD;  [facility];  Service: General;  Laterality: N/A;  . MENISCUS REPAIR     right  . REDUCTION MAMMAPLASTY Bilateral 1998  . scoliosis  1970, 1972   no retained hardware  . TOTAL KNEE ARTHROPLASTY Right 02/26/2018   Procedure: RIGHT TOTAL KNEE ARTHROPLASTY;  Surgeon: Sydnee Cabal, MD;  [facility];  Service: Orthopedics;  Laterality: Right;    There were no vitals filed for this visit.   Subjective Assessment - 03/13/21 1653    Subjective Pt reports she has been trying Epley maneuver in the AM and has been sleeping in recliner and has noted decreased intensity of symptoms  Currently in Pain? Yes    Pain Score 5    dizziness                             Vestibular Treatment/Exercise - 03/13/21 0001      Vestibular Treatment/Exercise   Vestibular Treatment Provided Canalith Repositioning    Canalith Repositioning Epley Manuever Left    Habituation Exercises Seated Horizontal Head Turns;Seated Vertical Head Turns      Seated Horizontal Head Turns   Number of Reps  30      Seated Vertical Head Turns   Number of Reps  30                 PT Education - 03/13/21 1751    Education Details education on habituation exercises    Person(s) Educated Patient    Methods Explanation;Handout    Comprehension Verbalized understanding            PT Short Term Goals - 03/01/21  1219      PT SHORT TERM GOAL #1   Title Patient will be independent with HEP in order to improve functional outcomes.    Time 2    Period Weeks    Status New    Target Date 03/15/21      PT SHORT TERM GOAL #2   Title Patient will report at least 25% improvement in symptoms for improved quality of life.    Time 2    Period Weeks    Status New    Target Date 03/15/21             PT Long Term Goals - 03/01/21 1220      PT LONG TERM GOAL #1   Title Patient will report at least 75% improvement in symptoms for improved quality of life.    Time 4    Period Weeks    Status New    Target Date 03/29/21      PT LONG TERM GOAL #2   Title Patient will improve FOTO score by at least 5 points in order to indicate improved tolerance to activity.    Baseline 48.8% function    Time 4    Period Weeks    Status New    Target Date 03/29/21                 Plan - 03/13/21 1755    Clinical Impression Statement Patient reports decrease in intensity of symptoms but has noticed continued instances of dizziness when arising from bed on left side.  No overshooting of gaze noted during habituation or stabilization activities/assessment. Continued sessions indicated for assessment and tx to mitigate vertigo symptoms and develop in HEP    Personal Factors and Comorbidities Age;Time since onset of injury/illness/exacerbation    Examination-Activity Limitations Bend;Lift;Reach Overhead    Examination-Participation Restrictions Cleaning;Community Activity;Yard Work    Stability/Clinical Decision Making Stable/Uncomplicated    Rehab Potential Good    PT Frequency 1x / week    PT Duration 4 weeks    PT Treatment/Interventions ADLs/Self Care Home Management;Aquatic Therapy;Canalith Repostioning;Electrical Stimulation;DME Instruction;Gait training;Stair training;Functional mobility training;Therapeutic activities;Therapeutic exercise;Balance training;Patient/family education;Neuromuscular  re-education;Manual techniques;Passive range of motion;Vestibular;Spinal Manipulations;Joint Manipulations    PT Next Visit Plan assess for success with Epley self-maneuver    PT Home Exercise Plan Epley left ear    Consulted and Agree with Plan of Care Patient           Patient  will benefit from skilled therapeutic intervention in order to improve the following deficits and impairments:  Decreased balance,Dizziness,Difficulty walking  Visit Diagnosis: Dizziness and giddiness  Unsteadiness on feet     Problem List Patient Active Problem List   Diagnosis Date Noted  . Pain in joint of left shoulder 04/13/2019  . Aftercare 03/22/2018  . History of total knee replacement, right 03/22/2018  . Pain in right knee 03/16/2018  . Primary osteoarthritis of right knee 02/26/2018  . S/P knee replacement 02/26/2018  . Osteoarthritis 02/17/2018  . S/P Nissen fundoplication (without gastrostomy tube) procedure 06/29/2015  . Upper airway cough syndrome 03/14/2015  . Dyspnea 03/13/2015  . Chronic venous insufficiency 07/21/2014  . Lymphedema of arm 08/11/2011   6:01 PM, 03/13/21 M. Sherlyn Lees, PT, DPT Physical Therapist- [place] Office Number: [phone]  [facility] [facility] [address] [place], [place], [address] Phone: [phone]   Fax:  [phone]  Name: Meghan Avila MRN: [id number] Date of Birth: [birth year]

## 2021-03-13 NOTE — Patient Instructions (Signed)
Access Code: [email or web address] URL: [email or web address] Date: 03/13/2021 Prepared by: Sherlyn Lees  Exercises Seated to Fold Over Vestibular Habituation - 1 x daily - 7 x weekly - 1 sets - 10 reps Seated Gaze Stabilization with Head Rotation - 1 x daily - 7 x weekly Seated Gaze Stabilization with Head Nod - 1 x daily - 7 x weekly

## 2021-03-14 DIAGNOSIS — N3946 Mixed incontinence: Secondary | ICD-10-CM | POA: Diagnosis not present

## 2021-03-14 DIAGNOSIS — N8110 Cystocele, unspecified: Secondary | ICD-10-CM | POA: Diagnosis not present

## 2021-03-18 ENCOUNTER — Other Ambulatory Visit: Payer: Self-pay | Admitting: Urology

## 2021-03-19 ENCOUNTER — Ambulatory Visit (HOSPITAL_COMMUNITY): Payer: PPO | Admitting: Physical Therapy

## 2021-03-19 ENCOUNTER — Other Ambulatory Visit: Payer: Self-pay

## 2021-03-19 ENCOUNTER — Encounter (HOSPITAL_COMMUNITY): Payer: Self-pay | Admitting: Physical Therapy

## 2021-03-19 DIAGNOSIS — R2681 Unsteadiness on feet: Secondary | ICD-10-CM

## 2021-03-19 DIAGNOSIS — R42 Dizziness and giddiness: Secondary | ICD-10-CM | POA: Diagnosis not present

## 2021-03-19 NOTE — Therapy (Signed)
[facility] [address] [place], [place], [address] Phone: [phone]   Fax:  [phone]  Physical Therapy Treatment  Patient Details  Name: Meghan Avila MRN: [id number] Date of Birth: Jul 12, 1951 Referring Provider (PT): Dr. Kathyrn Lass   Encounter Date: 03/19/2021   PT End of Session - 03/19/21 1054     Visit Number 3    Number of Visits 4    Date for PT Re-Evaluation 03/29/21    Authorization Type [employer]    PT Start Time 1050    PT Stop Time 1126   pt went to restroom during session   PT Time Calculation (min) 36 min    Activity Tolerance Patient tolerated treatment well    Behavior During Therapy [facility] for tasks assessed/performed             Past Medical History:  Diagnosis Date   Arthritis    Family history of adverse reaction to anesthesia    post nausea and vomitting   GERD (gastroesophageal reflux disease)    hx of   H/O wheezing    occ.   Hand swelling    related to lymph nodes    Headache    migraines   History of hiatal hernia    Leg swelling    left leg -edema knee level to ankle.   Osteoporosis    Peripheral vascular disease ([place])    left leg    PONV (postoperative nausea and vomiting)    Scoliosis     Past Surgical History:  Procedure Laterality Date   ANAL RECTAL MANOMETRY N/A 08/03/2019   Procedure: ANO RECTAL MANOMETRY;  Surgeon: Leighton Ruff, MD;  [facility];  Service: Endoscopy;  Laterality: N/A;   BACK SURGERY     scoliosis surgery 1970 and [place]   laparoscopic   COLONOSCOPY WITH PROPOFOL N/A 04/19/2015   Procedure: COLONOSCOPY WITH PROPOFOL;  Surgeon: Garlan Fair, MD;  [facility];  Service: Endoscopy;  Laterality: N/A;   DEEP AXILLARY SENTINEL NODE BIOPSY / EXCISION     x2   ESOPHAGEAL MANOMETRY N/A 05/04/2015   Procedure: ESOPHAGEAL MANOMETRY (EM);  Surgeon: Garlan Fair,  MD;  [facility];  Service: Endoscopy;  Laterality: N/A;   ESOPHAGOGASTRODUODENOSCOPY (EGD) WITH PROPOFOL N/A 04/19/2015   Procedure: ESOPHAGOGASTRODUODENOSCOPY (EGD) WITH PROPOFOL;  Surgeon: Garlan Fair, MD;  [facility];  Service: Endoscopy;  Laterality: N/A;   HEMORROIDECTOMY  2002, 2005   hiatal hernia surgery      JOINT REPLACEMENT     total right knee Dr. Theda Sers 02-26-18   LASER ABLATION  2008   left leg    left knee surgery - torn meniscus (left)     LIPOMA EXCISION N/A 09/21/2015   Procedure: EXCISION 6CM BACK LIPOMA;  Surgeon: Ralene Ok, MD;  [facility];  Service: General;  Laterality: N/A;   MENISCUS REPAIR     right   REDUCTION MAMMAPLASTY Bilateral 1998   scoliosis  1970, 1972   no retained hardware   TOTAL KNEE ARTHROPLASTY Right 02/26/2018   Procedure: RIGHT TOTAL KNEE ARTHROPLASTY;  Surgeon: Sydnee Cabal, MD;  [facility];  Service: Orthopedics;  Laterality: Right;    There were no vitals filed for this visit.   Subjective Assessment - 03/19/21 1053     Subjective Dizziness was getting better until Sunday. States she woke up twice in the  night where she had to leap out of bed for different things. Then that day her vertigo was so bad she couldn't walk and she was dry heaving. States that she made it back to her recliner and just sat in her room that day. States she hasn't tried the [employer]    Currently in Pain? Yes    Pain Score 5    dizziness/vertigo               [facility] PT Assessment - 03/19/21 0001       Assessment   Medical Diagnosis vestibular    Referring Provider (PT) Dr. Kathyrn Lass                           [facility] Adult PT Treatment/Exercise - 03/19/21 0001       Exercises   Exercises Lumbar      Lumbar Exercises: Stretches   Other Lumbar Stretch Exercise lumbar flexion seated - 2x10 for habituation - reduced synptoms note with repeption             Vestibular  Treatment/Exercise - 03/19/21 0001       Vestibular Treatment/Exercise   Canalith Repositioning Epley Manuever Left    Habituation Exercises Seated Horizontal Head Turns;Seated Vertical Head Turns;Comment       EPLEY MANUEVER LEFT   Number of Reps  1     RESPONSE DETAILS LEFT --   upbeating nystagmus     Seated Horizontal Head Turns   Number of Reps  20      Seated Vertical Head Turns   Number of Reps  20                   PT Education - 03/19/21 1121     Education Details Educated patient in difference between peripheral and central vestibular issues. on POC and rationale for exercises. on how prolonged sititng in recliner can cuse nerve impingement and numbness in hands    Person(s) Educated Patient    Methods Explanation    Comprehension Verbalized understanding              PT Short Term Goals - 03/01/21 1219       PT SHORT TERM GOAL #1   Title Patient will be independent with HEP in order to improve functional outcomes.    Time 2    Period Weeks    Status New    Target Date 03/15/21      PT SHORT TERM GOAL #2   Title Patient will report at least 25% improvement in symptoms for improved quality of life.    Time 2    Period Weeks    Status New    Target Date 03/15/21               PT Long Term Goals - 03/01/21 1220       PT LONG TERM GOAL #1   Title Patient will report at least 75% improvement in symptoms for improved quality of life.    Time 4    Period Weeks    Status New    Target Date 03/29/21      PT LONG TERM GOAL #2   Title Patient will improve FOTO score by at least 5 points in order to indicate improved tolerance to activity.    Baseline 48.8% function    Time 4    Period Weeks    Status New  Target Date 03/29/21                   Plan - 03/19/21 1127     Clinical Impression Statement Continued with habituation exercises and this was tolerated moderately well. Educated patient in difference between  peripheral and central vestibular issues. Nystagmus with epley maneuver and after left maneuver symptoms were a little better afterwards. Encouraged patient to continue with exercises at home. Will attempt epley again next session if indicated.    Personal Factors and Comorbidities Age;Time since onset of injury/illness/exacerbation    Examination-Activity Limitations Bend;Lift;Reach Overhead    Examination-Participation Restrictions Cleaning;Community Activity;Yard Work    Stability/Clinical Decision Making Stable/Uncomplicated    Rehab Potential Good    PT Frequency 1x / week    PT Duration 4 weeks    PT Treatment/Interventions ADLs/Self Care Home Management;Aquatic Therapy;Canalith Repostioning;Electrical Stimulation;DME Instruction;Gait training;Stair training;Functional mobility training;Therapeutic activities;Therapeutic exercise;Balance training;Patient/family education;Neuromuscular re-education;Manual techniques;Passive range of motion;Vestibular;Spinal Manipulations;Joint Manipulations    PT Next Visit Plan assess for success with Epley self-maneuver, f/u with habituation exercises    PT Home Exercise Plan Epley left ear    Consulted and Agree with Plan of Care Patient             Patient will benefit from skilled therapeutic intervention in order to improve the following deficits and impairments:  Decreased balance, Dizziness, Difficulty walking  Visit Diagnosis: Dizziness and giddiness  Unsteadiness on feet     Problem List Patient Active Problem List   Diagnosis Date Noted   Pain in joint of left shoulder 04/13/2019   Aftercare 03/22/2018   History of total knee replacement, right 03/22/2018   Pain in right knee 03/16/2018   Primary osteoarthritis of right knee 02/26/2018   S/P knee replacement 02/26/2018   Osteoarthritis 02/17/2018   S/P Nissen fundoplication (without gastrostomy tube) procedure 06/29/2015   Upper airway cough syndrome 03/14/2015   Dyspnea  03/13/2015   Chronic venous insufficiency 07/21/2014   Lymphedema of arm 08/11/2011   11:29 AM, 03/19/21 Jerene Pitch, DPT Physical Therapy with [facility]  [phone] office   [place], [place], [address] Phone: [phone]   Fax:  [phone]  Name: Meghan Avila MRN: [id number] Date of Birth: 1950-10-24

## 2021-03-26 ENCOUNTER — Encounter (HOSPITAL_COMMUNITY): Payer: Self-pay | Admitting: Physical Therapy

## 2021-03-26 ENCOUNTER — Other Ambulatory Visit: Payer: Self-pay

## 2021-03-26 ENCOUNTER — Ambulatory Visit (HOSPITAL_COMMUNITY): Payer: PPO | Admitting: Physical Therapy

## 2021-03-26 DIAGNOSIS — R42 Dizziness and giddiness: Secondary | ICD-10-CM

## 2021-03-26 DIAGNOSIS — R2681 Unsteadiness on feet: Secondary | ICD-10-CM

## 2021-03-26 NOTE — Therapy (Signed)
[facility] [address] [place], [place], [address] Phone: [phone]   Fax:  [phone]  Physical Therapy Treatment and Discharge Note  Patient Details  Name: Meghan Avila MRN: [id number] Date of Birth: [birth year] Referring Provider (PT): Dr. Kathyrn Lass   PHYSICAL THERAPY DISCHARGE SUMMARY  Visits from Start of Care: 4  Current functional level related to goals / functional outcomes: See below  Remaining deficits: Continued dizziness   Education / Equipment: See below  Patient agrees to discharge. Patient goals were not met. Patient is being discharged due to lack of progress.   Encounter Date: 03/26/2021   PT End of Session - 03/26/21 0958     Visit Number 4    Number of Visits 4    Date for PT Re-Evaluation 03/29/21    Authorization Type [employer]    PT Start Time 1000    PT Stop Time 1030    PT Time Calculation (min) 30 min    Activity Tolerance Patient tolerated treatment well    Behavior During Therapy [facility] for tasks assessed/performed             Past Medical History:  Diagnosis Date   Arthritis    Family history of adverse reaction to anesthesia    post nausea and vomitting   GERD (gastroesophageal reflux disease)    hx of   H/O wheezing    occ.   Hand swelling    related to lymph nodes    Headache    migraines   History of hiatal hernia    Leg swelling    left leg -edema knee level to ankle.   Osteoporosis    Peripheral vascular disease ([place])    left leg    PONV (postoperative nausea and vomiting)    Scoliosis     Past Surgical History:  Procedure Laterality Date   ANAL RECTAL MANOMETRY N/A 08/03/2019   Procedure: ANO RECTAL MANOMETRY;  Surgeon: Leighton Ruff, MD;  [facility];  Service: Endoscopy;  Laterality: N/A;   BACK SURGERY     scoliosis surgery 1970 and [place]   laparoscopic   COLONOSCOPY  WITH PROPOFOL N/A 04/19/2015   Procedure: COLONOSCOPY WITH PROPOFOL;  Surgeon: Garlan Fair, MD;  [facility];  Service: Endoscopy;  Laterality: N/A;   DEEP AXILLARY SENTINEL NODE BIOPSY / EXCISION     x2   ESOPHAGEAL MANOMETRY N/A 05/04/2015   Procedure: ESOPHAGEAL MANOMETRY (EM);  Surgeon: Garlan Fair, MD;  [facility];  Service: Endoscopy;  Laterality: N/A;   ESOPHAGOGASTRODUODENOSCOPY (EGD) WITH PROPOFOL N/A 04/19/2015   Procedure: ESOPHAGOGASTRODUODENOSCOPY (EGD) WITH PROPOFOL;  Surgeon: Garlan Fair, MD;  [facility];  Service: Endoscopy;  Laterality: N/A;   HEMORROIDECTOMY  2002, 2005   hiatal hernia surgery      JOINT REPLACEMENT     total right knee Dr. Theda Sers 02-26-18   LASER ABLATION  2008   left leg    left knee surgery - torn meniscus (left)     LIPOMA EXCISION N/A 09/21/2015   Procedure: EXCISION 6CM BACK LIPOMA;  Surgeon: Ralene Ok, MD;  [facility];  Service: General;  Laterality: N/A;   MENISCUS REPAIR     right   REDUCTION MAMMAPLASTY Bilateral 1998   scoliosis  1970, 1972   no retained hardware   TOTAL KNEE ARTHROPLASTY Right 02/26/2018   Procedure: RIGHT TOTAL KNEE  ARTHROPLASTY;  Surgeon: Sydnee Cabal, MD;  [facility];  Service: Orthopedics;  Laterality: Right;    There were no vitals filed for this visit.   Subjective Assessment - 03/26/21 1007     Subjective States that after Tuesday she felt alright and Thursday she had a major bout of dizziness again and had dry heaving and couldn't get up. States that she took her medication and the episode took all day. States symptoms presented when she started walking. States she was light-headedness too. States that it took hours prior to medication fully taking effect. States since Sunday she has had 2 different times her left ear has been ringing and it lasted at least a minute each time and was loud and clear. Has an apt with ENT on July 8th.  Reports she has  not had any imaging of her head and she was sent here by her PCP.    Currently in Pain? Yes                [facility] PT Assessment - 03/26/21 0001       Assessment   Medical Diagnosis vestibular    Referring Provider (PT) Dr. Kathyrn Lass      Observation/Other Assessments   Focus on Therapeutic Outcomes (FOTO)  51% function   was 49% function                                  PT Education - 03/26/21 1008     Education Details on current presentation, how BPPV presents and how current presnation doesn't present like her symptoms, reviewed HEP, next step in care and going to ED if symptoms intensify prior to ENT appointment.    Person(s) Educated Patient    Methods Explanation    Comprehension Verbalized understanding              PT Short Term Goals - 03/26/21 1011       PT SHORT TERM GOAL #1   Title Patient will be independent with HEP in order to improve functional outcomes.    Time 2    Period Weeks    Status Achieved    Target Date 03/15/21      PT SHORT TERM GOAL #2   Title Patient will report at least 25% improvement in symptoms for improved quality of life.    Baseline 0% better    Time 2    Period Weeks    Status Not Met    Target Date 03/15/21               PT Long Term Goals - 03/26/21 1012       PT LONG TERM GOAL #1   Title Patient will report at least 75% improvement in symptoms for improved quality of life.    Time 4    Period Weeks    Status Not Met      PT LONG TERM GOAL #2   Title Patient will improve FOTO score by at least 5 points in order to indicate improved tolerance to activity.    Baseline was 48.8% function now 51% function    Time 4    Period Weeks    Status Not Met                   Plan - 03/26/21 0958     Clinical Impression Statement No significant progress made since initial session.  Symptoms continue and present all day long. Patient has been doing exercises but symptoms have  intensified where she has to stay still all day. Patient to follow up with ENT on July 8th, will DC form PT to HEP secondary to lack of progress in therapy and symptom duration not consistent with BPPV.    Personal Factors and Comorbidities Age;Time since onset of injury/illness/exacerbation    Examination-Activity Limitations Bend;Lift;Reach Overhead    Examination-Participation Restrictions Cleaning;Community Activity;Yard Work    Stability/Clinical Decision Making Stable/Uncomplicated    Rehab Potential Good    PT Frequency 1x / week    PT Duration 4 weeks    PT Treatment/Interventions ADLs/Self Care Home Management;Aquatic Therapy;Canalith Repostioning;Electrical Stimulation;DME Instruction;Gait training;Stair training;Functional mobility training;Therapeutic activities;Therapeutic exercise;Balance training;Patient/family education;Neuromuscular re-education;Manual techniques;Passive range of motion;Vestibular;Spinal Manipulations;Joint Manipulations    PT Next Visit Plan DC to HEP    PT Home Exercise Plan Epley left ear    Consulted and Agree with Plan of Care Patient             Patient will benefit from skilled therapeutic intervention in order to improve the following deficits and impairments:  Decreased balance, Dizziness, Difficulty walking  Visit Diagnosis: Dizziness and giddiness  Unsteadiness on feet     Problem List Patient Active Problem List   Diagnosis Date Noted   Pain in joint of left shoulder 04/13/2019   Aftercare 03/22/2018   History of total knee replacement, right 03/22/2018   Pain in right knee 03/16/2018   Primary osteoarthritis of right knee 02/26/2018   S/P knee replacement 02/26/2018   Osteoarthritis 02/17/2018   S/P Nissen fundoplication (without gastrostomy tube) procedure 06/29/2015   Upper airway cough syndrome 03/14/2015   Dyspnea 03/13/2015   Chronic venous insufficiency 07/21/2014   Lymphedema of arm 08/11/2011   10:29 AM,  03/26/21 Jerene Pitch, DPT Physical Therapy with [facility]  [phone] office   [place] [address] [place], [place], [address] Phone: [phone]   Fax:  [phone]  Name: Meghan Avila MRN: [id number] Date of Birth: [birth year]

## 2021-04-02 ENCOUNTER — Encounter (HOSPITAL_COMMUNITY): Payer: PPO

## 2021-04-04 DIAGNOSIS — R42 Dizziness and giddiness: Secondary | ICD-10-CM | POA: Diagnosis not present

## 2021-04-05 DIAGNOSIS — H8113 Benign paroxysmal vertigo, bilateral: Secondary | ICD-10-CM | POA: Diagnosis not present

## 2021-04-05 DIAGNOSIS — H9191 Unspecified hearing loss, right ear: Secondary | ICD-10-CM | POA: Diagnosis not present

## 2021-04-12 DIAGNOSIS — R42 Dizziness and giddiness: Secondary | ICD-10-CM | POA: Diagnosis not present

## 2021-04-12 DIAGNOSIS — R202 Paresthesia of skin: Secondary | ICD-10-CM | POA: Diagnosis not present

## 2021-04-12 DIAGNOSIS — R519 Headache, unspecified: Secondary | ICD-10-CM | POA: Diagnosis not present

## 2021-04-16 ENCOUNTER — Other Ambulatory Visit: Payer: Self-pay | Admitting: Family Medicine

## 2021-04-16 DIAGNOSIS — R42 Dizziness and giddiness: Secondary | ICD-10-CM

## 2021-04-16 DIAGNOSIS — R519 Headache, unspecified: Secondary | ICD-10-CM

## 2021-04-17 ENCOUNTER — Other Ambulatory Visit: Payer: Self-pay

## 2021-04-17 ENCOUNTER — Ambulatory Visit (HOSPITAL_COMMUNITY)
Admission: RE | Admit: 2021-04-17 | Discharge: 2021-04-17 | Disposition: A | Discharge status: Home / Self Care | Payer: PPO | Source: Ambulatory Visit | Attending: Family Medicine | Admitting: Family Medicine

## 2021-04-17 DIAGNOSIS — R42 Dizziness and giddiness: Secondary | ICD-10-CM | POA: Diagnosis not present

## 2021-04-17 DIAGNOSIS — R519 Headache, unspecified: Secondary | ICD-10-CM | POA: Insufficient documentation

## 2021-04-17 DIAGNOSIS — G319 Degenerative disease of nervous system, unspecified: Secondary | ICD-10-CM | POA: Diagnosis not present

## 2021-04-18 ENCOUNTER — Encounter: Payer: Self-pay | Admitting: Neurology

## 2021-04-18 ENCOUNTER — Ambulatory Visit: Payer: PPO | Admitting: Neurology

## 2021-04-18 VITALS — BP 138/77 | HR 72 | Ht 60.0 in | Wt 156.2 lb

## 2021-04-18 DIAGNOSIS — R202 Paresthesia of skin: Secondary | ICD-10-CM | POA: Diagnosis not present

## 2021-04-18 DIAGNOSIS — R519 Headache, unspecified: Secondary | ICD-10-CM

## 2021-04-18 DIAGNOSIS — E669 Obesity, unspecified: Secondary | ICD-10-CM | POA: Diagnosis not present

## 2021-04-18 DIAGNOSIS — R42 Dizziness and giddiness: Secondary | ICD-10-CM

## 2021-04-18 DIAGNOSIS — R351 Nocturia: Secondary | ICD-10-CM | POA: Diagnosis not present

## 2021-04-18 NOTE — Progress Notes (Signed)
Subjective:    Patient ID: Meghan Avila is a 70 y.o. female.  HPI    Star Age, MD, PhD [facility] [address], [address] [place], [place] [address]  Dear Dr. Sabra Heck,   I saw your patient, Meghan Avila, upon your kind request, in my Neurologic clinic today for initial consultation of her recurrent headaches.  The patient is unaccompanied today.  As you know, [name] is a 70 year old right-handed woman with an underlying medical history of reflux disease, osteoporosis, peripheral vascular disease, peripheral edema, vitamin D deficiency, diverticulosis, arthritis with status post right knee replacement, scoliosis with status post rod placement at age 60 with removal at age 50, borderline obesity, and vertigo, who reports a history of migrainous headaches for the past 2 to 3 years.  When she first started having migraines her severity was worse.  Lately, she has up to 3 or 4 migraine headaches per month.  She is trying to keep a log.  She describes a throbbing headache, it is primarily in the frontal areas bilaterally, denies any temporal tenderness or neck pain.  She has suffered from intermittent vertigo.  She has completed physical therapy for 4 weeks, without significant improvement.  Her vertigo attacks started back in April 2022 when she had another attack in May 2022.  Symptoms lasted for several days in May.  They were shorter in April.  She had associated nausea and headaches and vertigo do not typically come together.  She has had intermittent tingling and numbness in her right hand but it seems to be independent of her headaches.  Excedrin Migraine helps her headaches, it does not last several hours or days.  In fact, after taking Excedrin and staying in bed she has no significant residual headache.  She has woken up with a headache typically, headaches are not strictly one-sided.  She had an eye examination last year and is due for October  2022 for her routine follow-up.  She does not have any new visual symptoms no sudden onset of one-sided weakness or numbness or tingling or droopy face or slurring of speech other than intermittent tingling and numbness in her right hand.  She has not fallen.  She limits her caffeine to 1 cup of coffee.  She drinks alcohol rarely.  She quit smoking some 36 years ago.  She lives alone, husband died some 8 years ago, her only son lives with his family in [place].  She does not hydrate very well.  She estimates that she drinks about 3 to 4 cups of water per day.    Of note, she thinks she snores when she sleeps on her back.  She has nocturia about once or twice per average night.  She does not sleep well at night.  She takes melatonin.  She would be reluctant to come in for sleep study but would be willing to consider a home sleep test.  I reviewed your office note from 04/12/2021.  She has had several bouts of vertigo in the past several months.  She saw Dr. Melida Quitter in ENT in June 2022 and hearing testing as well as Marye Round testing was recommended.  She has had mild hearing loss.  She had further testing through audiology on 04/05/2021 and was found to have mild hearing loss on the right.  Dix-Hallpike was positive bilaterally.  She has had intermittent numbness in her hand.  You ordered a brain MRI.  She had a brain MRI  without contrast at [facility] on 04/17/2021 and I was able to review some of the images through the PACS system but not all of them.  A formal report is pending.  I did not see any obvious abnormality.  She previously had physical therapy with vestibular rehab with modest results.  She was treated for COVID in May 2022 with Paxlovid.  She reports a family history of migraines affecting her mom and her sister.  She also reports stress, she helps take care of her 6 year old mother.  Her Past Medical History Is Significant For: Past Medical History:  Diagnosis Date    Arthritis    Family history of adverse reaction to anesthesia    post nausea and vomitting   GERD (gastroesophageal reflux disease)    hx of   H/O wheezing    occ.   Hand swelling    related to lymph nodes    Headache    migraines   History of hiatal hernia    Leg swelling    left leg -edema knee level to ankle.   Osteoporosis    Peripheral vascular disease (HCC)    left leg    PONV (postoperative nausea and vomiting)    Scoliosis     Her Past Surgical History Is Significant For: Past Surgical History:  Procedure Laterality Date   ANAL RECTAL MANOMETRY N/A 08/03/2019   Procedure: ANO RECTAL MANOMETRY;  Surgeon: Leighton Ruff, MD;  Location: [facility];  Service: Endoscopy;  Laterality: N/A;   BACK SURGERY     scoliosis surgery 1970 and [place]   laparoscopic   COLONOSCOPY WITH PROPOFOL N/A 04/19/2015   Procedure: COLONOSCOPY WITH PROPOFOL;  Surgeon: Garlan Fair, MD;  Location: [facility];  Service: Endoscopy;  Laterality: N/A;   DEEP AXILLARY SENTINEL NODE BIOPSY / EXCISION     x2   ESOPHAGEAL MANOMETRY N/A 05/04/2015   Procedure: ESOPHAGEAL MANOMETRY (EM);  Surgeon: Garlan Fair, MD;  Location: [facility];  Service: Endoscopy;  Laterality: N/A;   ESOPHAGOGASTRODUODENOSCOPY (EGD) WITH PROPOFOL N/A 04/19/2015   Procedure: ESOPHAGOGASTRODUODENOSCOPY (EGD) WITH PROPOFOL;  Surgeon: Garlan Fair, MD;  Location: [facility];  Service: Endoscopy;  Laterality: N/A;   HEMORROIDECTOMY  2002, 2005   hiatal hernia surgery      JOINT REPLACEMENT     total right knee Dr. Theda Sers 02-26-18   LASER ABLATION  2008   left leg    left knee surgery - torn meniscus (left)     LIPOMA EXCISION N/A 09/21/2015   Procedure: EXCISION 6CM BACK LIPOMA;  Surgeon: Ralene Ok, MD;  Location: [facility];  Service: General;  Laterality: N/A;   MENISCUS REPAIR     right   REDUCTION MAMMAPLASTY Bilateral 1998   scoliosis  1970,  1972   no retained hardware   TOTAL KNEE ARTHROPLASTY Right 02/26/2018   Procedure: RIGHT TOTAL KNEE ARTHROPLASTY;  Surgeon: Sydnee Cabal, MD;  Location: [facility];  Service: Orthopedics;  Laterality: Right;    Her Family History Is Significant For: Family History  Problem Relation Age of Onset   Varicose Veins Mother    Rheum arthritis Father    Cancer Sister    Heart disease Sister        before age 13   Hypertension Sister    Varicose Veins Sister    Heart attack Sister    Hyperlipidemia Brother    Rheum arthritis Sister  Allergies Sister    Breast cancer Neg Hx     Her Social History Is Significant For: Social History   Socioeconomic History   Marital status: Widowed    Spouse name: Not on file   Number of children: Not on file   Years of education: Not on file   Highest education level: Not on file  Occupational History   Occupation: Payroll Admin   Tobacco Use   Smoking status: Former    Packs/day: 1.00    Years: 5.00    Pack years: 5.00    Types: Cigarettes    Quit date: 10/06/1978    Years since quitting: 42.5   Smokeless tobacco: Never  Vaping Use   Vaping Use: Never used  Substance and Sexual Activity   Alcohol use: No    Alcohol/week: 0.0 standard drinks   Drug use: No   Sexual activity: Yes  Other Topics Concern   Not on file  Social History Narrative   Not on file   Social Determinants of Health   Financial Resource Strain: Not on file  Food Insecurity: Not on file  Transportation Needs: Not on file  Physical Activity: Not on file  Stress: Not on file  Social Connections: Not on file    Her Allergies Are:  Allergies  Allergen Reactions   Zantac [Ranitidine Hcl] Anaphylaxis   Morphine And Related Nausea And Vomiting    Morphine, Hydrocodone Tolerates Dilaudid, Fentanyl per Epic   Prednisone Diarrhea and Nausea And Vomiting   Tramadol Nausea And Vomiting    AMS  :   Her Current Medications Are:  Outpatient Encounter Medications  as of 04/18/2021  Medication Sig   aspirin-acetaminophen-caffeine (EXCEDRIN MIGRAINE) 250-250-65 MG per tablet Take 1 tablet by mouth daily as needed for headache or migraine.    atorvastatin (LIPITOR) 10 MG tablet Take 10 mg by mouth daily.   diclofenac sodium (VOLTAREN) 1 % GEL Apply 1 application topically 2 (two) times daily as needed (KNEE PAIN).   omeprazole (PRILOSEC) 20 MG capsule omeprazole 20 mg capsule,delayed release   vitamin C (ASCORBIC ACID) 500 MG tablet Take 500 mg by mouth daily.   Vitamin D, Ergocalciferol, (DRISDOL) 50000 UNITS CAPS capsule Take 50,000 Units by mouth every 7 (seven) days. Saturday   [DISCONTINUED] acetaminophen (TYLENOL) 500 MG tablet Take 1,000 mg by mouth every 6 (six) hours as needed for mild pain, moderate pain or fever. (Patient not taking: Reported on 04/18/2021)   [DISCONTINUED] aspirin 81 MG chewable tablet Chew 81 mg by mouth daily.   [DISCONTINUED] Ferrous Sulfate 140 (45 Fe) MG TBCR Take 1 tablet by mouth daily. (Patient not taking: Reported on 04/18/2021)   [DISCONTINUED] ibuprofen (ADVIL,MOTRIN) 200 MG tablet Take 200 mg by mouth every 6 (six) hours as needed for mild pain or moderate pain.   No facility-administered encounter medications on file as of 04/18/2021.  :   Review of Systems:  Out of a complete 14 point review of systems, all are reviewed and negative with the exception of these symptoms as listed below:  Review of Systems  Neurological:        Here for consult on h/a with associated dizziness. Reports she has completed PT to help with the dizziness ( question of vertigo) but did not help. She also also seen an audiologist for this. Reports the audiologist has help with her dizziness some. Pt had MRI through [facility] last week, results not finalized in epic at this time.   Objective:  Neurological Exam  Physical Exam Physical Examination:   Vitals:   04/18/21 1218  BP: 138/77  Pulse: 72    General Examination: The  patient is a very pleasant 70 y.o. female in no acute distress. She appears well-developed and well-nourished and well groomed.   HEENT: Normocephalic, atraumatic, pupils are equal, round and reactive to light and accommodation.  Corrective eyeglasses in place.  No temporal tenderness or palpable cord.  Extraocular tracking is good without limitation to gaze excursion or nystagmus noted. Normal smooth pursuit is noted. Hearing is grossly intact. Tympanic membranes are clear bilaterally. Face is symmetric with normal facial animation and normal facial sensation. Speech is clear with no dysarthria noted. There is no hypophonia. There is no lip, neck/head, jaw or voice tremor. Neck is supple with full range of passive and active motion. There are no carotid bruits on auscultation. Oropharynx exam reveals: mild mouth dryness, adequate dental hygiene and mild airway crowding, due for small airway entry and somewhat redundant soft palate, Mallampati class II, tonsils small, tongue protrudes centrally and palate elevates symmetrically.  Chest: Clear to auscultation without wheezing, rhonchi or crackles noted.  Heart: S1+S2+0, regular and normal without murmurs, rubs or gallops noted.   Abdomen: Soft, non-tender and non-distended with normal bowel sounds appreciated on auscultation.  Extremities: There is significant edema in the left distal lower extremity, mild edema in the right distal lower extremity. Pedal pulses are intact.  Skin: Warm and dry without trophic changes noted.   Musculoskeletal: exam reveals arthritic changes in both hands, right knee wider than left, status post right knee replacement, also reports some discomfort in the left knee.   Neurologically:  Mental status: The patient is awake, alert and oriented in all 4 spheres. Her immediate and remote memory, attention, language skills and fund of knowledge are appropriate. There is no evidence of aphasia, agnosia, apraxia or anomia. Speech  is clear with normal prosody and enunciation. Thought process is linear. Mood is normal and affect is normal.  Cranial nerves II - XII are as described above under HEENT exam. In addition: shoulder shrug is normal with equal shoulder height noted. Motor exam: Normal bulk, strength and tone is noted. There is no drift, tremor or rebound. Romberg is negative. Reflexes are 2+ in the upper extremities and left knee, trace in the ankles, right knee absent reflex.  Toes are downgoing bilaterally.  Fine motor skills and coordination: intact with normal finger taps, normal hand movements, normal rapid alternating patting, normal foot taps and normal foot agility.  Cerebellar testing: No dysmetria or intention tremor on finger to nose testing. Heel to shin is unremarkable bilaterally. There is no truncal or gait ataxia.  Sensory exam: intact to light touch, vibration, temperature sense in the upper and lower extremities.  Gait, station and balance: She stands easily. No veering to one side is noted. No leaning to one side is noted. Posture is age-appropriate and stance is narrow based. Gait shows normal stride length and normal pace. No problems turning are noted.   Assessment and Plan:  In summary, Shirly Bartosiewicz is a very pleasant 70 y.o.-year old female with an underlying medical history of reflux disease, osteoporosis, peripheral vascular disease, peripheral edema, vitamin D deficiency, diverticulosis, arthritis with status post right knee replacement, scoliosis with status post rod placement at age 65 with removal at age 78, borderline obesity, and vertigo, who presents for evaluation of her recurrent headaches of approximately 2 to 3 years duration.  In the  recent past she has had several bouts of vertigo.  Headaches and vertigo symptoms do not coincide typically.  She has a family history of migraines, migraine frequency is not very high, she has good results with as needed use of Excedrin.  She has a  benign neurological exam today.  We talked about headache triggers including sleep deprivation, dehydration, stress.  She may have a component of underlying sleep disordered breathing.  Since she has never had a sleep study I recommend we proceed with evaluation with at least a home sleep test.  She would be reluctant to consider a laboratory study at this time but is willing to pursue home sleep test.  We talked about headache management.  She declines a preventative for migraines at this time.  She is advised that we could consider prescription medicine for as needed use in the near future but for now, she feels she gets by with the Excedrin.  For her vertigo, she is advised that there are certain triggers including stress and dehydration.  She is reminded to stay better hydrated with water.  She is advised to change positions slowly and continue to work with ENT as planned.  She is waiting for the MRI report.  I checked, and official brain MRI report is not available yet.  As per my review of her images, I did not see any obvious structural abnormality thankfully or recent or remote stroke.  We will await formal reading.  As of right now, we will pursue a home sleep test and consider treatment for sleep apnea.  She is advised that obstructive sleep apnea when it is moderate to severe can cause medical complications including heart disease and stroke risk, symptoms of sleep apnea can include sleep disruption, nocturia and morning headaches.  Of note, she has had primarily headaches in the mornings.  She is advised that we will call her with her sleep test report.  We will also plan a follow-up after sleep testing.  I answered all her questions today and she was in agreement with the plan.  Thank you very much for allowing me to participate in the care of this nice patient. If I can be of any further assistance to you please do not hesitate to call me at [phone].  Sincerely,   Star Age, MD, PhD

## 2021-04-18 NOTE — Patient Instructions (Signed)
It was nice to meet you today!  Your neurological exam is benign currently which is reassuring.  I do believe you have positional vertigo.  Unfortunately, vertigo can last several hours or several days and can recur without warning.  You may have migraine headaches as you have a family history of this, you may also have morning headaches from underlying sleep apnea.  As discussed, I would like to proceed with a home sleep test to evaluate you for sleep apnea. As explained, an attended sleep study meaning you get to stay overnight in the sleep lab, lets [place] monitor sleep-related behaviors such as sleep talking and leg movements in sleep, in addition to monitoring for sleep apnea.  A home sleep test is a screening tool for sleep apnea only, and unfortunately does not help with any other sleep-related diagnoses.  Please remember, the long-term risks and ramifications of untreated moderate to severe obstructive sleep apnea are: increased Cardiovascular disease, including congestive heart failure, stroke, difficult to control hypertension, treatment resistant obesity, arrhythmias, especially irregular heartbeat commonly known as A. Fib. (atrial fibrillation); even type 2 diabetes has been linked to untreated OSA.   Sleep apnea can cause disruption of sleep and sleep deprivation in most cases, which, in turn, can cause recurrent headaches, problems with memory, mood, concentration, focus, and vigilance. Most people with untreated sleep apnea report excessive daytime sleepiness, which can affect their ability to drive. Please do not drive if you feel sleepy. Patients with sleep apnea can also develop difficulty initiating and maintaining sleep (aka insomnia).  Please try to hydrate better with water.  6 to 8 cups/day are recommended, generally speaking, 8 ounce size each.  Please remember, common headache triggers are: sleep deprivation, dehydration, overheating, stress, hypoglycemia or skipping meals and blood  sugar fluctuations, excessive pain medications or excessive alcohol use or caffeine withdrawal. Some people have food triggers such as aged cheese, orange juice or chocolate, especially dark chocolate, or MSG (monosodium glutamate). Try to avoid these headache triggers as much possible. It may be helpful to keep a headache diary to figure out what makes your headaches worse or brings them on and what alleviates them. Some people report headache onset after exercise but studies have shown that regular exercise may actually prevent headaches from coming. If you have exercise-induced headaches, please make sure that you drink plenty of fluid before and after exercising and that you do not over do it and do not overheat.

## 2021-04-22 ENCOUNTER — Telehealth: Payer: Self-pay | Admitting: Neurology

## 2021-04-22 DIAGNOSIS — E237 Disorder of pituitary gland, unspecified: Secondary | ICD-10-CM

## 2021-04-22 NOTE — Telephone Encounter (Signed)
Spoke to pt, results have not been reviewed my MD yet . Pt voiced understanding

## 2021-04-22 NOTE — Telephone Encounter (Signed)
Pt is asking for a call to discuss the results to her MRI

## 2021-04-23 ENCOUNTER — Telehealth: Payer: Self-pay | Admitting: Neurology

## 2021-04-23 NOTE — Telephone Encounter (Signed)
04/23/21 [employer] faxed to [facility] for scheduling

## 2021-04-23 NOTE — Telephone Encounter (Signed)
Pt came by and is asking about her MRI results.  (This was resulted to her by ordering NP, but she is asking what the next step is.  I explained that since Dr. Rexene Alberts is out will address with [facility] Dr. Jannifer Franklin this am and see what he recommends.  Pt is very concerned as she has seen the results on mychart.  I did see the note with Dr. Rexene Alberts noting not seeing results when pt in the office on 04-18-21.

## 2021-04-23 NOTE — Addendum Note (Signed)
Addended by: Kathrynn Ducking on: 04/23/2021 04:31 PM   Modules accepted: Orders

## 2021-04-23 NOTE — Telephone Encounter (Signed)
I called the patient.  MRI of the brain shows mild small vessel disease, the patient does not have hypertension, could be related to history of migraine.  She is having episodes of vertigo and has undergone vestibular rehabilitation without much benefit, again vertigo could potentially be related to migraine history.  She has an abnormality of the pituitary infundibulum on MRI, I will get a pituitary protocol study.   MRI brain 04/18/21:  IMPRESSION: 1. No evidence of acute infarct. 2. Thickening of the pituitary infundibulum. Recommend pituitary protocol MRI with contrast to further characterize and to exclude a mass. 3. Mild to moderate chronic microvascular ischemic disease and mild atrophy. 4. Small right mastoid effusion.

## 2021-04-29 ENCOUNTER — Ambulatory Visit (HOSPITAL_COMMUNITY)
Admission: RE | Admit: 2021-04-29 | Discharge: 2021-04-29 | Disposition: A | Discharge status: Home / Self Care | Payer: PPO | Source: Ambulatory Visit | Attending: Neurology | Admitting: Neurology

## 2021-04-29 ENCOUNTER — Other Ambulatory Visit: Payer: Self-pay

## 2021-04-29 DIAGNOSIS — E237 Disorder of pituitary gland, unspecified: Secondary | ICD-10-CM | POA: Insufficient documentation

## 2021-04-29 DIAGNOSIS — R42 Dizziness and giddiness: Secondary | ICD-10-CM | POA: Diagnosis not present

## 2021-04-29 MED ORDER — GADOBUTROL 1 MMOL/ML IV SOLN
7.0000 mL | Freq: Once | INTRAVENOUS | Status: AC | PRN
  Administered 2021-04-29: 5 mL via INTRAVENOUS

## 2021-05-01 ENCOUNTER — Telehealth: Payer: Self-pay

## 2021-05-01 NOTE — Telephone Encounter (Signed)
I called patient.  I advised her that her recent repeat brain MRI showed no obvious tumor or abnormality.  I advised her that it is recommended to repeat this scan in about 6 months to demonstrate stable findings.  I advised her that she can have her PCP order this repeat brain MRI.  If patient would prefer [facility] to order this for her she may follow-up in our office in about 6 months with a nurse practitioner.  Patient will discuss this further with a family member.  If she decides she would like [facility] to order this repeat scan for her she will call us back to schedule a 25-monthfollow-up with the nurse practitioner.  Patient verbalized understanding of results.  Patient has no further questions or concerns at this time.

## 2021-05-01 NOTE — Telephone Encounter (Signed)
-----   Message from Star Age, MD sent at 05/01/2021  3:59 PM EDT ----- Please call patient that her recent repeat brain MRI with special attention to the pituitary gland showed no obvious tumor or abnormality.  It was recommended to do a repeat scan in about 6 months to demonstrate stable findings.  She can have a repeat brain MRI with pituitary protocol through primary care or she can make a follow-up appointment in this office for about 6 months so we can order another brain MRI at the time.  If she would like to make a follow-up appointment, please assist with a follow-up appointment with one of our nurse practitioners in about 6 months.

## 2021-05-10 DIAGNOSIS — M810 Age-related osteoporosis without current pathological fracture: Secondary | ICD-10-CM | POA: Diagnosis not present

## 2021-05-17 DIAGNOSIS — R351 Nocturia: Secondary | ICD-10-CM | POA: Diagnosis not present

## 2021-05-17 DIAGNOSIS — N8110 Cystocele, unspecified: Secondary | ICD-10-CM | POA: Diagnosis not present

## 2021-05-20 NOTE — Progress Notes (Signed)
DUE TO COVID-19 ONLY ONE VISITOR IS ALLOWED TO COME WITH YOU AND STAY IN THE WAITING ROOM ONLY DURING PRE OP AND PROCEDURE DAY OF SURGERY. THE 1 VISITOR  MAY VISIT WITH YOU AFTER SURGERY IN YOUR PRIVATE ROOM DURING VISITING HOURS ONLY!  YOU NEED TO HAVE A COVID 19 TEST ON_8/22/2022 ______ '@_______'$ , THIS TEST MUST BE DONE BEFORE SURGERY, See Map.                 Meghan Avila  05/20/2021   Your procedure is scheduled on:               05/30/2021   Report to [facility] Main  Entrance   Report to admitting at     [phone]     Call this number if you have problems the morning of surgery [phone]    Remember: Do not eat food , candy gum or mints :After Midnight. You may have clear liquids from midnight until  0430am    CLEAR LIQUID DIET   Foods Allowed                                                                       Coffee and tea, regular and decaf                              Plain Jell-O any favor except red or purple                                            Fruit ices (not with fruit pulp)                                      Iced Popsicles                                     Carbonated beverages, regular and diet                                    Cranberry, grape and apple juices Sports drinks like Gatorade Lightly seasoned clear broth or consume(fat free) Sugar, honey syrup   _____________________________________________________________________    BRUSH YOUR TEETH MORNING OF SURGERY AND RINSE YOUR MOUTH OUT, NO CHEWING GUM CANDY OR MINTS.     Take these medicines the morning of surgery with A SIP OF WATER:   prilosec   DO NOT TAKE ANY DIABETIC MEDICATIONS DAY OF YOUR SURGERY                               You may not have any metal on your body including hair pins and              piercings  Do not wear jewelry, make-up, lotions, powders or perfumes, deodorant  Do not wear nail polish on your fingernails.  Do not shave  48 hours  prior to surgery.              Men may shave face and neck.   Do not bring valuables to the hospital. [place].  Contacts, dentures or bridgework may not be worn into surgery.  Leave suitcase in the car. After surgery it may be brought to your room.     Patients discharged the day of surgery will not be allowed to drive home. IF YOU ARE HAVING SURGERY AND GOING HOME THE SAME DAY, YOU MUST HAVE AN ADULT TO DRIVE YOU HOME AND BE WITH YOU FOR 24 HOURS. YOU MAY GO HOME BY TAXI OR UBER OR ORTHERWISE, BUT AN ADULT MUST ACCOMPANY YOU HOME AND STAY WITH YOU FOR 24 HOURS.  Name and phone number of your driver:  Special Instructions: N/A              Please read over the following fact sheets you were given: _____________________________________________________________________  [facility] - Preparing for Surgery Before surgery, you can play an important role.  Because skin is not sterile, your skin needs to be as free of germs as possible.  You can reduce the number of germs on your skin by washing with CHG (chlorahexidine gluconate) soap before surgery.  CHG is an antiseptic cleaner which kills germs and bonds with the skin to continue killing germs even after washing. Please DO NOT use if you have an allergy to CHG or antibacterial soaps.  If your skin becomes reddened/irritated stop using the CHG and inform your nurse when you arrive at Short Stay. Do not shave (including legs and underarms) for at least 48 hours prior to the first CHG shower.  You may shave your face/neck. Please follow these instructions carefully:  1.  Shower with CHG Soap the night before surgery and the  morning of Surgery.  2.  If you choose to wash your hair, wash your hair first as usual with your  normal  shampoo.  3.  After you shampoo, rinse your hair and body thoroughly to remove the  shampoo.                           4.  Use CHG as you would any other liquid soap.  You  can apply chg directly  to the skin and wash                       Gently with a scrungie or clean washcloth.  5.  Apply the CHG Soap to your body ONLY FROM THE NECK DOWN.   Do not use on face/ open                           Wound or open sores. Avoid contact with eyes, ears mouth and genitals (private parts).                       Wash face,  Genitals (private parts) with your normal soap.             6.  Wash thoroughly, paying special attention to the area where your surgery  will be performed.  7.  Thoroughly rinse your body  with warm water from the neck down.  8.  DO NOT shower/wash with your normal soap after using and rinsing off  the CHG Soap.                9.  Pat yourself dry with a clean towel.            10.  Wear clean pajamas.            11.  Place clean sheets on your bed the night of your first shower and do not  sleep with pets. Day of Surgery : Do not apply any lotions/deodorants the morning of surgery.  Please wear clean clothes to the hospital/surgery center.  FAILURE TO FOLLOW THESE INSTRUCTIONS MAY RESULT IN THE CANCELLATION OF YOUR SURGERY PATIENT SIGNATURE_________________________________  NURSE SIGNATURE__________________________________  ________________________________________________________________________

## 2021-05-22 ENCOUNTER — Encounter (HOSPITAL_COMMUNITY)
Admission: RE | Admit: 2021-05-22 | Discharge: 2021-05-22 | Disposition: A | Discharge status: Home / Self Care | Payer: PPO | Source: Ambulatory Visit | Attending: Urology | Admitting: Urology

## 2021-05-22 ENCOUNTER — Other Ambulatory Visit: Payer: Self-pay

## 2021-05-22 ENCOUNTER — Ambulatory Visit: Payer: PPO | Admitting: Neurology

## 2021-05-22 ENCOUNTER — Encounter (HOSPITAL_COMMUNITY): Payer: Self-pay

## 2021-05-22 DIAGNOSIS — R519 Headache, unspecified: Secondary | ICD-10-CM

## 2021-05-22 DIAGNOSIS — G4733 Obstructive sleep apnea (adult) (pediatric): Secondary | ICD-10-CM

## 2021-05-22 DIAGNOSIS — Z01812 Encounter for preprocedural laboratory examination: Secondary | ICD-10-CM | POA: Diagnosis not present

## 2021-05-22 DIAGNOSIS — E669 Obesity, unspecified: Secondary | ICD-10-CM

## 2021-05-22 DIAGNOSIS — R202 Paresthesia of skin: Secondary | ICD-10-CM

## 2021-05-22 DIAGNOSIS — R42 Dizziness and giddiness: Secondary | ICD-10-CM

## 2021-05-22 DIAGNOSIS — R351 Nocturia: Secondary | ICD-10-CM

## 2021-05-22 HISTORY — DX: Dizziness and giddiness: R42

## 2021-05-22 LAB — COMPREHENSIVE METABOLIC PANEL WITH GFR
ALT: 19 U/L (ref 0–44)
AST: 21 U/L (ref 15–41)
Albumin: 3.6 g/dL (ref 3.5–5.0)
Alkaline Phosphatase: 59 U/L (ref 38–126)
Anion gap: 5 (ref 5–15)
BUN: 16 mg/dL (ref 8–23)
CO2: 27 mmol/L (ref 22–32)
Calcium: 8.7 mg/dL — ABNORMAL LOW (ref 8.9–10.3)
Chloride: 107 mmol/L (ref 98–111)
Creatinine, Ser: 0.58 mg/dL (ref 0.44–1.00)
GFR, Estimated: >60 mL/min
Glucose, Bld: 94 mg/dL (ref 70–99)
Potassium: 3.8 mmol/L (ref 3.5–5.1)
Sodium: 139 mmol/L (ref 135–145)
Total Bilirubin: 0.7 mg/dL (ref 0.3–1.2)
Total Protein: 6.2 g/dL — ABNORMAL LOW (ref 6.5–8.1)

## 2021-05-22 LAB — CBC
HCT: 40 % (ref 36.0–46.0)
Hemoglobin: 12.9 g/dL (ref 12.0–15.0)
MCH: 28 pg (ref 26.0–34.0)
MCHC: 32.3 g/dL (ref 30.0–36.0)
MCV: 86.8 fL (ref 80.0–100.0)
Platelets: 210 K/uL (ref 150–400)
RBC: 4.61 MIL/uL (ref 3.87–5.11)
RDW: 13.3 % (ref 11.5–15.5)
WBC: 5.7 K/uL (ref 4.0–10.5)
nRBC: 0 % (ref 0.0–0.2)

## 2021-05-22 NOTE — Progress Notes (Addendum)
Anesthesia Review:  PCP: DR Kathyrn Lass - Requested LOV note. 04/12/21 on chart  Cardiologist : none  Neuro- [facility]- [place] 04/18/21 - Dr Donia Ast  ENT- Dr Redmond Baseman- Canal[place] repositioningi maneuver on 04/05/21 maneuver on OV 8/19 for vertigo has already had one done already  Vasculars MD- has not seen in years per pt  Chest x-ray : EKG : Echo : Stress test: Cardiac Cath :  Activity level: can do a flight of stairs without difficulty  Sleep Study/ CPAP : none  Sleep test scheduled for 05/22/21 - home test per pt by [facility]  Fasting Blood Sugar :      / Checks Blood Sugar -- times a day:   Blood Thinner/ Instructions /Last Dose: ASA / Instructions/ Last Dose :   PT reports current hx of vertigo.   See above.  Covid test- 05/27/21

## 2021-05-22 NOTE — Progress Notes (Signed)
AT time of preop appt pt had questions in regards to bowel prep prior to surgery.  PT states that DR Louis Meckel mentioned something abut a bowel prep.  No orders in epic.  Pt also wants to know can she take Excedin am of surgery. Pt states she fees sure she will have a migraine am of surgery.  Also pt wants prescriptions called in to her pharmacy ahead of time so that she can pick up the day before surgery and already have at home.  LVMM for Darrel Reach at [facility] .  Darrel Reach called me back after pt had left and I told her all of above.  She will call the pt.

## 2021-05-23 LAB — URINE CULTURE: Culture: NO GROWTH

## 2021-05-24 DIAGNOSIS — H8112 Benign paroxysmal vertigo, left ear: Secondary | ICD-10-CM | POA: Diagnosis not present

## 2021-05-27 ENCOUNTER — Other Ambulatory Visit: Payer: Self-pay | Admitting: Urology

## 2021-05-28 DIAGNOSIS — M79603 Pain in arm, unspecified: Secondary | ICD-10-CM | POA: Diagnosis not present

## 2021-05-28 LAB — SARS CORONAVIRUS 2 (TAT 6-24 HRS): SARS Coronavirus 2: NEGATIVE

## 2021-05-29 NOTE — Anesthesia Preprocedure Evaluation (Addendum)
Anesthesia Evaluation  Patient identified by MRN, date of birth, ID band Patient awake    Reviewed: Allergy & Precautions, NPO status , Patient's Chart, lab work & pertinent test results  History of Anesthesia Complications (+) PONV  Airway Mallampati: II  TM Distance: >3 FB Neck ROM: Full    Dental  (+) Dental Advisory Given   Pulmonary former smoker,  05/27/2021 SARS coronavirus NEG   breath sounds clear to auscultation       Cardiovascular (-) angina+ Peripheral Vascular Disease   Rhythm:Regular Rate:Normal  '15 ECHO: EF 55-65%, normal LVF, no significant valvular abnormalities   Neuro/Psych  Headaches,    GI/Hepatic Neg liver ROS, GERD  Medicated and Controlled,  Endo/Other  obese  Renal/GU negative Renal ROS     Musculoskeletal  (+) Arthritis ,   Abdominal (+) + obese,   Peds  Hematology negative hematology ROS (+)   Anesthesia Other Findings   Reproductive/Obstetrics                            Anesthesia Physical Anesthesia Plan  ASA: 3  Anesthesia Plan: General   Post-op Pain Management:    Induction: Intravenous  PONV Risk Score and Plan: 4 or greater and Ondansetron, Dexamethasone and Treatment may vary due to age or medical condition  Airway Management Planned: Oral ETT  Additional Equipment: None  Intra-op Plan:   Post-operative Plan: Extubation in OR  Informed Consent: I have reviewed the patients History and Physical, chart, labs and discussed the procedure including the risks, benefits and alternatives for the proposed anesthesia with the patient or authorized representative who has indicated his/her understanding and acceptance.     Dental advisory given  Plan Discussed with: CRNA and Surgeon  Anesthesia Plan Comments:        Anesthesia Quick Evaluation

## 2021-05-30 ENCOUNTER — Encounter (HOSPITAL_COMMUNITY)
Admission: RE | Disposition: A | Discharge status: Home / Self Care | Payer: Self-pay | Source: Home / Self Care | Attending: Urology

## 2021-05-30 ENCOUNTER — Ambulatory Visit (HOSPITAL_COMMUNITY): Payer: PPO | Admitting: Emergency Medicine

## 2021-05-30 ENCOUNTER — Ambulatory Visit (HOSPITAL_COMMUNITY): Payer: PPO | Admitting: Anesthesiology

## 2021-05-30 ENCOUNTER — Observation Stay (HOSPITAL_COMMUNITY)
Admission: RE | Admit: 2021-05-30 | Discharge: 2021-05-31 | Disposition: A | Discharge status: Home / Self Care | Payer: PPO | Attending: Urology | Admitting: Urology

## 2021-05-30 ENCOUNTER — Encounter (HOSPITAL_COMMUNITY): Payer: Self-pay | Admitting: Urology

## 2021-05-30 ENCOUNTER — Other Ambulatory Visit: Payer: Self-pay

## 2021-05-30 DIAGNOSIS — N814 Uterovaginal prolapse, unspecified: Secondary | ICD-10-CM

## 2021-05-30 DIAGNOSIS — Z96651 Presence of right artificial knee joint: Secondary | ICD-10-CM | POA: Diagnosis not present

## 2021-05-30 DIAGNOSIS — Z7982 Long term (current) use of aspirin: Secondary | ICD-10-CM | POA: Insufficient documentation

## 2021-05-30 DIAGNOSIS — Z87891 Personal history of nicotine dependence: Secondary | ICD-10-CM | POA: Insufficient documentation

## 2021-05-30 DIAGNOSIS — K219 Gastro-esophageal reflux disease without esophagitis: Secondary | ICD-10-CM | POA: Diagnosis not present

## 2021-05-30 DIAGNOSIS — Z79899 Other long term (current) drug therapy: Secondary | ICD-10-CM | POA: Insufficient documentation

## 2021-05-30 DIAGNOSIS — N819 Female genital prolapse, unspecified: Secondary | ICD-10-CM | POA: Diagnosis not present

## 2021-05-30 DIAGNOSIS — G43909 Migraine, unspecified, not intractable, without status migrainosus: Secondary | ICD-10-CM | POA: Diagnosis not present

## 2021-05-30 DIAGNOSIS — R351 Nocturia: Secondary | ICD-10-CM | POA: Diagnosis present

## 2021-05-30 DIAGNOSIS — N993 Prolapse of vaginal vault after hysterectomy: Secondary | ICD-10-CM | POA: Diagnosis not present

## 2021-05-30 DIAGNOSIS — N8110 Cystocele, unspecified: Secondary | ICD-10-CM | POA: Diagnosis not present

## 2021-05-30 DIAGNOSIS — M810 Age-related osteoporosis without current pathological fracture: Secondary | ICD-10-CM | POA: Diagnosis not present

## 2021-05-30 HISTORY — PX: ROBOTIC ASSISTED LAPAROSCOPIC SACROCOLPOPEXY: SHX5388

## 2021-05-30 LAB — TYPE AND SCREEN
ABO/RH(D): O NEG
Antibody Screen: NEGATIVE

## 2021-05-30 LAB — HEMOGLOBIN AND HEMATOCRIT, BLOOD
HCT: 39.2 % (ref 36.0–46.0)
Hemoglobin: 12.6 g/dL (ref 12.0–15.0)

## 2021-05-30 SURGERY — SACROCOLPOPEXY, ROBOT-ASSISTED, LAPAROSCOPIC
Anesthesia: General

## 2021-05-30 MED ORDER — LIDOCAINE HCL (CARDIAC) PF 100 MG/5ML IV SOSY
PREFILLED_SYRINGE | INTRAVENOUS | Status: DC | PRN
  Administered 2021-05-30: 20 mg via INTRAVENOUS

## 2021-05-30 MED ORDER — KETOROLAC TROMETHAMINE 15 MG/ML IJ SOLN
15.0000 mg | Freq: Four times a day (QID) | INTRAMUSCULAR | Status: DC
  Administered 2021-05-30 – 2021-05-31 (×4): 15 mg via INTRAVENOUS
  Filled 2021-05-30 (×4): qty 1

## 2021-05-30 MED ORDER — OXYCODONE HCL 5 MG PO TABS: 5.0000 mg | ORAL_TABLET | Freq: Once | ORAL | Status: DC | PRN

## 2021-05-30 MED ORDER — ESTRADIOL 0.1 MG/GM VA CREA
TOPICAL_CREAM | VAGINAL | Status: DC | PRN
  Administered 2021-05-30: 1 via VAGINAL

## 2021-05-30 MED ORDER — ALBUMIN HUMAN 5 % IV SOLN
INTRAVENOUS | Status: AC
  Filled 2021-05-30: qty 250

## 2021-05-30 MED ORDER — PROPOFOL 10 MG/ML IV BOLUS
INTRAVENOUS | Status: DC | PRN
  Administered 2021-05-30: 130 mg via INTRAVENOUS

## 2021-05-30 MED ORDER — BUPIVACAINE-EPINEPHRINE 0.5% -1:200000 IJ SOLN
INTRAMUSCULAR | Status: DC | PRN
  Administered 2021-05-30: 42 mL

## 2021-05-30 MED ORDER — DIPHENHYDRAMINE HCL 12.5 MG/5ML PO ELIX
12.5000 mg | ORAL_SOLUTION | Freq: Four times a day (QID) | ORAL | Status: DC | PRN
  Filled 2021-05-30: qty 5

## 2021-05-30 MED ORDER — FENTANYL CITRATE (PF) 100 MCG/2ML IJ SOLN
INTRAMUSCULAR | Status: AC
  Filled 2021-05-30: qty 2

## 2021-05-30 MED ORDER — ACETAMINOPHEN 10 MG/ML IV SOLN
1000.0000 mg | Freq: Four times a day (QID) | INTRAVENOUS | Status: AC
  Administered 2021-05-30 – 2021-05-31 (×4): 1000 mg via INTRAVENOUS
  Filled 2021-05-30 (×4): qty 100

## 2021-05-30 MED ORDER — PROPOFOL 500 MG/50ML IV EMUL
INTRAVENOUS | Status: DC | PRN
  Administered 2021-05-30: 150 ug/kg/min via INTRAVENOUS

## 2021-05-30 MED ORDER — HYDROMORPHONE HCL 2 MG/ML IJ SOLN
INTRAMUSCULAR | Status: AC
  Filled 2021-05-30: qty 1

## 2021-05-30 MED ORDER — HYDROMORPHONE HCL 1 MG/ML IJ SOLN
INTRAMUSCULAR | Status: DC | PRN
  Administered 2021-05-30: .5 mg via INTRAVENOUS

## 2021-05-30 MED ORDER — HYDROMORPHONE HCL 1 MG/ML IJ SOLN: 0.5000 mg | INTRAMUSCULAR | Status: DC | PRN

## 2021-05-30 MED ORDER — OXYCODONE HCL 5 MG/5ML PO SOLN: 5.0000 mg | Freq: Once | ORAL | Status: DC | PRN

## 2021-05-30 MED ORDER — DOCUSATE SODIUM 100 MG PO CAPS
100.0000 mg | ORAL_CAPSULE | Freq: Two times a day (BID) | ORAL | Status: DC
  Filled 2021-05-30 (×2): qty 1

## 2021-05-30 MED ORDER — FENTANYL CITRATE (PF) 100 MCG/2ML IJ SOLN
INTRAMUSCULAR | Status: DC | PRN
  Administered 2021-05-30 (×2): 50 ug via INTRAVENOUS

## 2021-05-30 MED ORDER — DIPHENHYDRAMINE HCL 50 MG/ML IJ SOLN: 12.5000 mg | Freq: Four times a day (QID) | INTRAMUSCULAR | Status: DC | PRN

## 2021-05-30 MED ORDER — MEPERIDINE HCL 50 MG/ML IJ SOLN: 6.2500 mg | INTRAMUSCULAR | Status: DC | PRN

## 2021-05-30 MED ORDER — ATORVASTATIN CALCIUM 10 MG PO TABS
10.0000 mg | ORAL_TABLET | Freq: Every day | ORAL | Status: DC
  Administered 2021-05-31: 10 mg via ORAL
  Filled 2021-05-30 (×2): qty 1

## 2021-05-30 MED ORDER — ROCURONIUM BROMIDE 10 MG/ML (PF) SYRINGE
PREFILLED_SYRINGE | INTRAVENOUS | Status: AC
  Filled 2021-05-30: qty 10

## 2021-05-30 MED ORDER — DEXAMETHASONE SODIUM PHOSPHATE 10 MG/ML IJ SOLN
INTRAMUSCULAR | Status: DC | PRN
  Administered 2021-05-30: 10 mg via INTRAVENOUS

## 2021-05-30 MED ORDER — PROMETHAZINE HCL 25 MG/ML IJ SOLN: 6.2500 mg | INTRAMUSCULAR | Status: DC | PRN

## 2021-05-30 MED ORDER — LIDOCAINE 2% (20 MG/ML) 5 ML SYRINGE
INTRAMUSCULAR | Status: AC
  Filled 2021-05-30: qty 5

## 2021-05-30 MED ORDER — ACETAMINOPHEN 500 MG PO TABS
1000.0000 mg | ORAL_TABLET | Freq: Once | ORAL | Status: AC
  Administered 2021-05-30: 1000 mg via ORAL
  Filled 2021-05-30: qty 2

## 2021-05-30 MED ORDER — PHENYLEPHRINE 40 MCG/ML (10ML) SYRINGE FOR IV PUSH (FOR BLOOD PRESSURE SUPPORT)
PREFILLED_SYRINGE | INTRAVENOUS | Status: DC | PRN
  Administered 2021-05-30 (×2): 80 ug via INTRAVENOUS

## 2021-05-30 MED ORDER — ALBUMIN HUMAN 5 % IV SOLN: INTRAVENOUS | Status: DC | PRN

## 2021-05-30 MED ORDER — PHENYLEPHRINE 40 MCG/ML (10ML) SYRINGE FOR IV PUSH (FOR BLOOD PRESSURE SUPPORT)
PREFILLED_SYRINGE | INTRAVENOUS | Status: AC
  Filled 2021-05-30: qty 10

## 2021-05-30 MED ORDER — PROPOFOL 1000 MG/100ML IV EMUL
INTRAVENOUS | Status: AC
  Filled 2021-05-30: qty 300

## 2021-05-30 MED ORDER — CEFAZOLIN SODIUM-DEXTROSE 2-4 GM/100ML-% IV SOLN
2.0000 g | INTRAVENOUS | Status: AC
  Administered 2021-05-30: 2 g via INTRAVENOUS
  Filled 2021-05-30: qty 100

## 2021-05-30 MED ORDER — MIDAZOLAM HCL 2 MG/2ML IJ SOLN
INTRAMUSCULAR | Status: AC
  Filled 2021-05-30: qty 2

## 2021-05-30 MED ORDER — PROPOFOL 10 MG/ML IV BOLUS
INTRAVENOUS | Status: AC
  Filled 2021-05-30: qty 20

## 2021-05-30 MED ORDER — BUPIVACAINE-EPINEPHRINE 0.5% -1:200000 IJ SOLN
INTRAMUSCULAR | Status: AC
  Filled 2021-05-30: qty 1

## 2021-05-30 MED ORDER — ONDANSETRON HCL 4 MG/2ML IJ SOLN
INTRAMUSCULAR | Status: DC | PRN
  Administered 2021-05-30: 4 mg via INTRAVENOUS

## 2021-05-30 MED ORDER — ROCURONIUM BROMIDE 100 MG/10ML IV SOLN
INTRAVENOUS | Status: DC | PRN
  Administered 2021-05-30 (×2): 20 mg via INTRAVENOUS
  Administered 2021-05-30: 60 mg via INTRAVENOUS

## 2021-05-30 MED ORDER — KETOROLAC TROMETHAMINE 30 MG/ML IJ SOLN
INTRAMUSCULAR | Status: DC | PRN
  Administered 2021-05-30: 30 mg via INTRAVENOUS

## 2021-05-30 MED ORDER — DOCUSATE SODIUM 100 MG PO CAPS: 100.0000 mg | ORAL_CAPSULE | Freq: Two times a day (BID) | ORAL | Status: DC

## 2021-05-30 MED ORDER — HYDROMORPHONE HCL 1 MG/ML IJ SOLN
INTRAMUSCULAR | Status: AC
  Filled 2021-05-30: qty 1

## 2021-05-30 MED ORDER — HYDROMORPHONE HCL 1 MG/ML IJ SOLN
0.2500 mg | INTRAMUSCULAR | Status: DC | PRN
  Administered 2021-05-30 (×2): 0.25 mg via INTRAVENOUS

## 2021-05-30 MED ORDER — ONDANSETRON HCL 4 MG/2ML IJ SOLN: 4.0000 mg | INTRAMUSCULAR | Status: DC | PRN

## 2021-05-30 MED ORDER — MIDAZOLAM HCL 2 MG/2ML IJ SOLN: 0.5000 mg | Freq: Once | INTRAMUSCULAR | Status: DC | PRN

## 2021-05-30 MED ORDER — PANTOPRAZOLE SODIUM 40 MG PO TBEC
40.0000 mg | DELAYED_RELEASE_TABLET | Freq: Every day | ORAL | Status: DC
  Administered 2021-05-31: 40 mg via ORAL
  Filled 2021-05-30 (×2): qty 1

## 2021-05-30 MED ORDER — BUPIVACAINE LIPOSOME 1.3 % IJ SUSP
20.0000 mL | Freq: Once | INTRAMUSCULAR | Status: AC
  Administered 2021-05-30: 20 mL
  Filled 2021-05-30: qty 20

## 2021-05-30 MED ORDER — LACTATED RINGERS IR SOLN
Status: DC | PRN
  Administered 2021-05-30: 1000 mL

## 2021-05-30 MED ORDER — LACTATED RINGERS IV SOLN: INTRAVENOUS | Status: DC

## 2021-05-30 MED ORDER — OXYBUTYNIN CHLORIDE ER 5 MG PO TB24
10.0000 mg | ORAL_TABLET | Freq: Every day | ORAL | Status: DC
  Administered 2021-05-30 – 2021-05-31 (×2): 10 mg via ORAL
  Filled 2021-05-30: qty 2
  Filled 2021-05-30: qty 1

## 2021-05-30 MED ORDER — STERILE WATER FOR IRRIGATION IR SOLN
Status: DC | PRN
  Administered 2021-05-30: 1000 mL

## 2021-05-30 MED ORDER — DEXTROSE-NACL 5-0.45 % IV SOLN: INTRAVENOUS | Status: DC

## 2021-05-30 MED ORDER — SUGAMMADEX SODIUM 200 MG/2ML IV SOLN
INTRAVENOUS | Status: DC | PRN
  Administered 2021-05-30: 300 mg via INTRAVENOUS

## 2021-05-30 MED ORDER — KETOROLAC TROMETHAMINE 10 MG PO TABS
10.0000 mg | ORAL_TABLET | Freq: Four times a day (QID) | ORAL | 0 refills | Status: DC | PRN
Start: 2021-05-30 — End: 2021-10-09

## 2021-05-30 MED ORDER — ESTRADIOL 0.1 MG/GM VA CREA
TOPICAL_CREAM | VAGINAL | Status: AC
  Filled 2021-05-30: qty 42.5

## 2021-05-30 MED ORDER — CHLORHEXIDINE GLUCONATE CLOTH 2 % EX PADS
6.0000 | MEDICATED_PAD | Freq: Every day | CUTANEOUS | Status: DC
  Administered 2021-05-30: 6 via TOPICAL

## 2021-05-30 MED ORDER — MIDAZOLAM HCL 5 MG/5ML IJ SOLN
INTRAMUSCULAR | Status: DC | PRN
  Administered 2021-05-30: 1 mg via INTRAVENOUS
  Administered 2021-05-30: 2 mg via INTRAVENOUS

## 2021-05-30 SURGICAL SUPPLY — 68 items
BAG COUNTER SPONGE SURGICOUNT (BAG) IMPLANT
BAG URINE DRAIN 2000ML AR STRL (UROLOGICAL SUPPLIES) ×2 IMPLANT
CATH FOLEY 2WAY SLVR  5CC 16FR (CATHETERS) ×2
CATH FOLEY 2WAY SLVR 5CC 16FR (CATHETERS) ×1 IMPLANT
CHLORAPREP W/TINT 26 (MISCELLANEOUS) ×2 IMPLANT
CLIP LIGATING HEMO LOK XL GOLD (MISCELLANEOUS) IMPLANT
CLIP LIGATING HEMO O LOK GREEN (MISCELLANEOUS) ×2 IMPLANT
COVER BACK TABLE 60X90IN (DRAPES) ×2 IMPLANT
COVER SURGICAL LIGHT HANDLE (MISCELLANEOUS) ×2 IMPLANT
COVER TIP SHEARS 8 DVNC (MISCELLANEOUS) ×1 IMPLANT
COVER TIP SHEARS 8MM DA VINCI (MISCELLANEOUS) ×2
DERMABOND ADVANCED (GAUZE/BANDAGES/DRESSINGS) ×1
DERMABOND ADVANCED .7 DNX12 (GAUZE/BANDAGES/DRESSINGS) ×1 IMPLANT
DRAIN CHANNEL RND F F (WOUND CARE) IMPLANT
DRAPE ARM DVNC X/XI (DISPOSABLE) ×4 IMPLANT
DRAPE COLUMN DVNC XI (DISPOSABLE) ×1 IMPLANT
DRAPE DA VINCI XI ARM (DISPOSABLE) ×8
DRAPE DA VINCI XI COLUMN (DISPOSABLE) ×2
DRAPE INCISE IOBAN 66X45 STRL (DRAPES) ×2 IMPLANT
DRAPE SHEET LG 3/4 BI-LAMINATE (DRAPES) ×4 IMPLANT
DRAPE SURG IRRIG POUCH 19X23 (DRAPES) ×2 IMPLANT
ELECT PENCIL ROCKER SW 15FT (MISCELLANEOUS) ×2 IMPLANT
ELECT REM PT RETURN 15FT ADLT (MISCELLANEOUS) ×2 IMPLANT
GAUZE 4X4 16PLY ~~LOC~~+RFID DBL (SPONGE) ×2 IMPLANT
GLOVE SURG ENC MOIS LTX SZ6.5 (GLOVE) ×2 IMPLANT
GLOVE SURG ENC TEXT LTX SZ7.5 (GLOVE) ×6 IMPLANT
GOWN STRL REUS W/TWL LRG LVL3 (GOWN DISPOSABLE) ×2 IMPLANT
GOWN STRL REUS W/TWL XL LVL3 (GOWN DISPOSABLE) ×4 IMPLANT
HOLDER FOLEY CATH W/STRAP (MISCELLANEOUS) ×2 IMPLANT
IRRIG SUCT STRYKERFLOW 2 WTIP (MISCELLANEOUS) ×2
IRRIGATION SUCT STRKRFLW 2 WTP (MISCELLANEOUS) ×1 IMPLANT
KIT BASIN OR (CUSTOM PROCEDURE TRAY) ×2 IMPLANT
KIT TURNOVER KIT A (KITS) ×2 IMPLANT
MANIPULATOR UTERINE 4.5 ZUMI (MISCELLANEOUS) IMPLANT
MARKER SKIN DUAL TIP RULER LAB (MISCELLANEOUS) ×2 IMPLANT
MESH Y UPSYLON VAGINAL (Mesh General) ×2 IMPLANT
OCCLUDER COLPOPNEUMO (BALLOONS) IMPLANT
PACKING VAGINAL (PACKING) ×2 IMPLANT
PAD OB MATERNITY 4.3X12.25 (PERSONAL CARE ITEMS) IMPLANT
PAD POSITIONING PINK XL (MISCELLANEOUS) ×2 IMPLANT
PANTS MESH DISP LRG (UNDERPADS AND DIAPERS) IMPLANT
PANTS MESH DISPOSABLE L (UNDERPADS AND DIAPERS)
SEAL CANN UNIV 5-8 DVNC XI (MISCELLANEOUS) ×4 IMPLANT
SEAL XI 5MM-8MM UNIVERSAL (MISCELLANEOUS) ×8
SET IRRIG Y TYPE TUR BLADDER L (SET/KITS/TRAYS/PACK) ×2 IMPLANT
SET TUBE SMOKE EVAC HIGH FLOW (TUBING) ×2 IMPLANT
SHEET LAVH (DRAPES) ×2 IMPLANT
SLEEVE SURGEON STRL (DRAPES) ×2 IMPLANT
SOL PREP POV-IOD 4OZ 10% (MISCELLANEOUS) ×2 IMPLANT
SOLUTION ELECTROLUBE (MISCELLANEOUS) ×2 IMPLANT
SURGILUBE 2OZ TUBE FLIPTOP (MISCELLANEOUS) ×2 IMPLANT
SUT MNCRL AB 4-0 PS2 18 (SUTURE) ×4 IMPLANT
SUT PROLENE 2 0 CT 1 (SUTURE) ×2 IMPLANT
SUT VIC AB 0 CT1 27 (SUTURE) ×4
SUT VIC AB 0 CT1 27XBRD ANTBC (SUTURE) ×2 IMPLANT
SUT VIC AB 2-0 SH 27 (SUTURE) ×10
SUT VIC AB 2-0 SH 27XBRD (SUTURE) ×5 IMPLANT
SUT VIC AB 3-0 SH 27 (SUTURE) ×4
SUT VIC AB 3-0 SH 27X BRD (SUTURE) ×2 IMPLANT
SUT VICRYL 0 UR6 27IN ABS (SUTURE) ×4 IMPLANT
SYR 50ML LL SCALE MARK (SYRINGE) IMPLANT
SYR BULB IRRIG 60ML STRL (SYRINGE) IMPLANT
TOWEL OR 17X26 10 PK STRL BLUE (TOWEL DISPOSABLE) ×2 IMPLANT
TRAY LAPAROSCOPIC (CUSTOM PROCEDURE TRAY) ×2 IMPLANT
TROCAR ENDOPATH XCEL 12X100 BL (ENDOMECHANICALS) IMPLANT
TROCAR XCEL 12X100 BLDLESS (ENDOMECHANICALS) ×2 IMPLANT
TROCAR XCEL NON-BLD 5MMX100MML (ENDOMECHANICALS) ×2 IMPLANT
WATER STERILE IRR 1000ML POUR (IV SOLUTION) ×2 IMPLANT

## 2021-05-30 NOTE — H&P (Signed)
70 year old female who presents today for further discussion of her pelvic organ prolapse. She has been followed for several years now by Dr. Matilde Sprang for similar issues. The patient is complaining of urinary urgency with associated urge incontinence as well as urinary frequency. She also is getting up several times during the night. She now has to strain to void and has significant straining with bowel movements. Because of her constipation she has noted worsening of her prolapse. In addition, the patient had hemorrhoid surgery in 2006 and since that time has had some associated fecal incontinence. Will the patient states that she often times has to push the bulge back up so that she can completely empty her bladder.   The patient is a widow, and currently not sexually active, but 1 day would like to be.   The patient had a hysterectomy and transvaginal vault suspension in the 80s. More recently she had a robotic assisted hiatal hernia repair in 2016. She did well from the surgery. Patient had a scoliosis surgery in her early 39s.   UDS: The patient underwent urodynamics recently. She had of small bladder capacity she was found to have unstable bladder, especially when going from sitting to standing. She had no leakage noted when her prolapse was reduced. She had a fairly weak stream. She was noted to be empty predominantly.   Interval: Today the patient follows up for further discussion and a physical exam prior to her upcoming surgery. She is scheduled for a robotic sacral colpopexy with no mid urethral sling. She denies any significant change to her history and physical. Her voiding symptoms are unchanged.     ALLERGIES: Prednisone - Diarrhea, Vomiting    MEDICATIONS: Aspirin 81 mg tablet,chewable  Estrace 0.01 % cream with applicator APPLY A THIN LAYER TO THE URETHRA AND THE CYSTOCELE THREE TIMES PER WEEK.  Myrbetriq 50 mg tablet, extended release 24 hr 1 tablet PO Daily  Atorvastatin Calcium   Calcium  Melatonin  Multiple Vitamin  Vita-C  Vitamin D2 1,250 mcg (50,000 unit) capsule     GU PSH: Complex cystometrogram, w/ void pressure and urethral pressure profile studies, any technique - 06/05/2020 Complex Uroflow - 06/05/2020 Cystocele Repair Emg surf Electrd - 06/05/2020 Inject For cystogram - 06/05/2020 Intrabd voidng Press - 06/05/2020     NON-GU PSH: Hernia Repair Partial Hysterectomy - 1988 Remove Gallbladder - 1992 Total Knee Replacement, Right - 2019     GU PMH: Cystocele, Unspec - 03/14/2021, - 01/31/2021, - 12/25/2020, - 06/14/2020, - 04/17/2020 Mixed incontinence - 03/14/2021, - 01/31/2021, - 06/14/2020 (Stable), - 2019, Urge and stress incontinence, - 2015 Nocturia (Stable) - 2019, Nocturia, - 2015 Urinary Frequency - 2019      PMH Notes: stomach ulcers   NON-GU PMH: Encounter for general adult medical examination without abnormal findings, Encounter for preventive health examination - 2015 Arthritis GERD    FAMILY HISTORY: 1 son - Other Endometrial cancer - Sister father deceased - Other heart - Brother, Sister Kidney Stones - Sister patient's mother is still living - Other    Notes: Mother is 53, Father died at 54   SOCIAL HISTORY: Marital Status: Widowed Preferred Language: English; Ethnicity: Not Hispanic Or Latino; Race: White Current Smoking Status: Patient does not smoke anymore. Has not smoked since 05/12/1982. Smoked for 10 years. Smoked 2 packs per day.   Tobacco Use Assessment Completed: Used Tobacco in last 30 days? Drinks 1 drink per week.  Drinks 2 caffeinated drinks per day. 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    REVIEW OF SYSTEMS:    GU Review Female:   Patient denies frequent urination, hard to postpone urination, burning /pain with urination, get up at night to urinate, leakage of urine, stream starts and stops, trouble starting your stream, have to strain to urinate, and being pregnant.  Gastrointestinal (Upper):   Patient denies  nausea, vomiting, and indigestion/ heartburn.  Gastrointestinal (Lower):   Patient denies diarrhea and constipation.  Constitutional:   Patient denies fever, night sweats, weight loss, and fatigue.  Skin:   Patient denies skin rash/ lesion and itching.  Eyes:   Patient denies blurred vision and double vision.  Ears/ Nose/ Throat:   Patient denies sore throat and sinus problems.  Hematologic/Lymphatic:   Patient denies swollen glands and easy bruising.  Cardiovascular:   Patient denies leg swelling and chest pains.  Respiratory:   Patient denies cough and shortness of breath.  Endocrine:   Patient denies excessive thirst.  Musculoskeletal:   Patient denies back pain and joint pain.  Neurological:   Patient denies headaches and dizziness.  Psychologic:   Patient denies depression and anxiety.   VITAL SIGNS:      05/17/2021 02:52 PM  Weight 150 lb / 68.04 kg  Height 60 in / 152.4 cm  BP 139/82 mmHg  Pulse 73 /min  Temperature 98.2 F / 36.7 C  BMI 29.3 kg/m   GU PHYSICAL EXAMINATION:    External Genitalia: No hirsutism, no rash, no scarring, no cyst, no erythematous lesion, no papular lesion, no blanched lesion, no warty lesion. No edema.  Urethral Meatus: Normal size. Normal position. No discharge.  Vagina: Moderate rectocele. Large cystocele. No atrophy, no stenosis. No enterocele.    MULTI-SYSTEM PHYSICAL EXAMINATION:    Constitutional: Well-nourished. No physical deformities. Normally developed. Good grooming.  Respiratory: Normal breath sounds. No labored breathing, no use of accessory muscles.   Cardiovascular: Regular rate and rhythm. No murmur, no gallop. Normal temperature, normal extremity pulses, no swelling, no varicosities.      Complexity of Data:  Source Of History:  Patient  Records Review:   Previous Doctor Records, Previous Patient Records  Urine Test Review:   Urinalysis   PROCEDURES:          Urinalysis w/Scope - 81001 Dipstick Dipstick Cont'd Micro  Color:  Yellow Bilirubin: Neg WBC/hpf: 6 - 10/hpf  Appearance: Clear Ketones: Neg RBC/hpf: NS (Not Seen)  Specific Gravity: 1.025 Blood: Neg Bacteria: NS (Not Seen)  pH: 5.5 Protein: Neg Cystals: NS (Not Seen)  Glucose: Neg Urobilinogen: 0.2 Casts: NS (Not Seen)    Nitrites: Neg Trichomonas: Not Present    Leukocyte Esterase: 1+ Mucous: Not Present      Epithelial Cells: 6 - 10/hpf      Yeast: NS (Not Seen)      Sperm: Not Present    Notes:      ASSESSMENT:      ICD-10 Details  1 GU:   Cystocele, Unspec - N81.10   2   Nocturia - R35.1    PLAN:           Orders Labs Urine Culture          Document Letter(s):  Created for Patient: Clinical Summary         Notes:   I went over robotic-assisted laparoscopic sacral colpopexy with the patient detail. I explained to the patient the rationale for the surgery. I also went over the placement of the laparoscopic ports. I detailed to her  the surgery as well as the postoperative recovery time. I explained to the patient that she could expect to be in the hospital at least one or 2 nights. She will require 4 weeks of no heavy lifting, 6 weeks of no bending or twisting. She will not be able to use her vagina for 6 weeks. I discussed complications of the operation including injury to bowel, ureters, bladder. We also discussed the risk of failure as well as the complications of mesh. I explained to them the difference between transvaginal mesh and the mesh used for sacral colpopexy. I reassured them that there has not been an FDA warnings in regards to sacral colpopexy mesh. We will plan to get this prior to her surgery. I spent 45 minutes with the patient going over that ins and outs of the surgery and answering all her questions.

## 2021-05-30 NOTE — Anesthesia Procedure Notes (Signed)
Procedure Name: Intubation Date/Time: 05/30/2021 7:45 AM Performed by: [place] Jaspal Pultz C, CRNA Pre-anesthesia Checklist: Patient identified, Emergency Drugs available, Suction available and Patient being monitored Patient Re-evaluated:Patient Re-evaluated prior to induction Oxygen Delivery Method: Circle system utilized Preoxygenation: Pre-oxygenation with 100% oxygen Induction Type: IV induction Ventilation: Mask ventilation without difficulty Laryngoscope Size: Mac and 3 Grade View: Grade I Tube type: Oral Tube size: 7.0 mm Number of attempts: 1 Airway Equipment and Method: Stylet and Oral airway Placement Confirmation: ETT inserted through vocal cords under direct vision, positive ETCO2 and breath sounds checked- equal and bilateral Secured at: 20 cm Tube secured with: Tape Dental Injury: Teeth and Oropharynx as per pre-operative assessment

## 2021-05-30 NOTE — Interval H&P Note (Signed)
History and Physical Interval Note:  05/30/2021 7:22 AM  Meghan Avila  has presented today for surgery, with the diagnosis of pelvic organ prolapse.  The various methods of treatment have been discussed with the patient and family. After consideration of risks, benefits and other options for treatment, the patient has consented to  Procedure(s): XI ROBOTIC ASSISTED LAPAROSCOPIC SACROCOLPOPEXY (N/A) as a surgical intervention.  The patient's history has been reviewed, patient examined, no change in status, stable for surgery.  I have reviewed the patient's chart and labs.  Questions were answered to the patient's satisfaction.     Ardis Hughs

## 2021-05-30 NOTE — Transfer of Care (Signed)
Immediate Anesthesia Transfer of Care Note  Patient: Meghan Avila  Procedure(s) Performed: XI ROBOTIC ASSISTED LAPAROSCOPIC SACROCOLPOPEXY  Patient Location: PACU  Anesthesia Type:General  Level of Consciousness: awake and drowsy  Airway & Oxygen Therapy: Patient Spontanous Breathing and Patient connected to face mask oxygen  Post-op Assessment: Report given to RN and Post -op Vital signs reviewed and stable  Post vital signs: Reviewed and stable  Last Vitals:  Vitals Value Taken Time  BP 131/60 05/30/21 1200  Temp    Pulse 77 05/30/21 1202  Resp 17 05/30/21 1202  SpO2 100 % 05/30/21 1202  Vitals shown include unvalidated device data.  Last Pain:  Vitals:   05/30/21 0547  TempSrc: Oral  PainSc:          Complications: No notable events documented.

## 2021-05-30 NOTE — Anesthesia Postprocedure Evaluation (Signed)
Anesthesia Post Note  Patient: Meghan Avila  Procedure(s) Performed: XI ROBOTIC ASSISTED LAPAROSCOPIC SACROCOLPOPEXY     Patient location during evaluation: PACU Anesthesia Type: General Level of consciousness: awake and alert, patient cooperative and oriented Pain management: pain level controlled Vital Signs Assessment: post-procedure vital signs reviewed and stable Respiratory status: spontaneous breathing, nonlabored ventilation and respiratory function stable Cardiovascular status: blood pressure returned to baseline and stable Postop Assessment: no apparent nausea or vomiting Anesthetic complications: no   No notable events documented.  Last Vitals:  Vitals:   05/30/21 1300 05/30/21 1315  BP: 118/71 119/75  Pulse: 63 69  Resp: (!) 8 14  Temp:    SpO2: 99% 95%    Last Pain:  Vitals:   05/30/21 1315  TempSrc:   PainSc: Asleep                 Katara Griner,E. Niah Heinle

## 2021-05-30 NOTE — Discharge Instructions (Signed)

## 2021-05-30 NOTE — Op Note (Signed)
Preoperative diagnosis:  Pelvic organ prolapse   Postoperative diagnosis:  same   Procedure: Robotic assisted laparoscopic sacrocolpopexy  Surgeon: Ardis Hughs, MD 1st assistant: Debbrah Alar, PA-C Resident Surgeon: Will [place], MD  Anesthesia: General  Complications: None  Intraoperative findings: [employer] scientific Upsilon Y-Mesh placed, cut to specific vaginal length  EBL: 50cc  Specimens: None  Indication: Hang Ortega is a 70 y.o. female patient with symptomatic pelvic organ prolapse.  After reviewing the management options for treatment, he elected to proceed with the above surgical procedure(s). We have discussed the potential benefits and risks of the procedure, side effects of the proposed treatment, the likelihood of the patient achieving the goals of the procedure, and any potential problems that might occur during the procedure or recuperation. Informed consent has been obtained.  Description of procedure:  A Foley catheter was then placed and placed to gravity drainage. I then made a periumbilical incision carrying the dissection down to the patient's fascia with electrocautery.  Once to the fascia, the fascia was incised the peritoneum opened.  A 6m port was then placed into the abdomen.  The abdomen was insufflated and the remaining ports placed under digital guidance.  2 ports were placed lateral to the umbilicus on the right proximally 10 cm apart.  The most lateral port was approximately 3 cm above the anterior iliac spine.  2 additional ports were placed in the patient's right side in comparable positions to the most lateral port on the right was a 12 mm port.the robot was then docked at an angle from the leg obliquely along the side of the left leg.  We then began our surgery by cleaning up some of the pelvic adhesions to the small bowel and colon.  Once this was completed I started dissecting at the sacral promontory located 3 cm medial to the  location where the ureter crosses over the iliac vessels at the pelvic brim. The posterior peritoneum was incised and the sacral prominence cleared off an area taking care to avoid the middle sacral vessels and the iliac branches.  I then created a posterior peritoneal tunnel starting at the sacral promontory and tunneling down the right pelvic sidewall down into the pelvis breaking back through the posterior peritoneum around the vesico-vaginal junction posteriorly.  I then continued the posterior dissection retracting down on the rectum and finding the avascular plane between the posterior vaginal wall and the rectum.  I carried this dissection down as far as I could to along the area of the perineal body.  I then turned my attention to the anterior plane between the anterior vaginal wall and the bladder.  I was able to obtain access to the avascular plane and with a combination of both monopolar cautery and blunt dissection was able to clean and nice down to the bladder neck.  I then turned my attention back to the patient's uterus and skeletonized the right uterine artery and vein and then took this with a series of bipolar moves.  I then performed a similar uterus pedicle ligation on the left.    Mesh was measured at approximately 7.5 cm anteriorly and 7.5 cm posteriorly and I cut this on the back table.  The mesh was then placed into the patient's abdomen through the assistant port and the anterior leaf was secured down onto the anterior vaginal wall with the apex at the bladder neck.  The posterior leaf was then secured down on the posterior vaginal wall.  These were  sewn down with 2-0 Vicryl.  Between 6 and 8 were done on each side.  At this point I then went back to the previously dissected sacral promontory and posterior peritoneal tunnel and inserted a instrument through the tunnel and grasped the end of the mesh at the vaginal cuff and pull it up to the sacrum.  I then checked to ensure that the sacral  mesh was not too tight by performing a vaginal exam.  I then secured the sacral leg of the mesh using a 0 Prolene.  I then reapproximated the posterior peritoneum with a 2-0 Vicryl in a running fashion around the sacral promontory.  The pelvic peritoneum was closed using a pursestring.   The fascia of the 12 mm port was then closed with 0 Vicryl in a figure-of-eight fashion.  The skin was closed with 4-0 Monocryl's.  Dermabond was applied to the incision and exparel injected into the incisions.  Estrace impregnated packing was then placed into her vagina which will be left in overnight.  The patient was subsequently extubated and returned to the PACU in excellent condition.   Ardis Hughs, M.D.

## 2021-05-31 ENCOUNTER — Encounter (HOSPITAL_COMMUNITY): Payer: Self-pay | Admitting: Urology

## 2021-05-31 DIAGNOSIS — N819 Female genital prolapse, unspecified: Secondary | ICD-10-CM | POA: Diagnosis not present

## 2021-05-31 LAB — BASIC METABOLIC PANEL
Anion gap: 4 — ABNORMAL LOW (ref 5–15)
BUN: 9 mg/dL (ref 8–23)
CO2: 27 mmol/L (ref 22–32)
Calcium: 8.5 mg/dL — ABNORMAL LOW (ref 8.9–10.3)
Chloride: 109 mmol/L (ref 98–111)
Creatinine, Ser: 0.48 mg/dL (ref 0.44–1.00)
GFR, Estimated: >60 mL/min (ref >60)
Glucose, Bld: 130 mg/dL — ABNORMAL HIGH (ref 70–99)
Potassium: 3.3 mmol/L — ABNORMAL LOW (ref 3.5–5.1)
Sodium: 140 mmol/L (ref 135–145)

## 2021-05-31 LAB — HEMOGLOBIN AND HEMATOCRIT, BLOOD
HCT: 32.4 % — ABNORMAL LOW (ref 36.0–46.0)
Hemoglobin: 10.6 g/dL — ABNORMAL LOW (ref 12.0–15.0)

## 2021-05-31 NOTE — Plan of Care (Signed)

## 2021-05-31 NOTE — Discharge Summary (Signed)
Date of admission: 05/30/2021  Date of discharge: 05/31/2021  Admission diagnosis: Pelvic organ prolapse  Discharge diagnosis: Pelvic organ prolapse  Secondary diagnoses:  Patient Active Problem List   Diagnosis Date Noted   Cystocele with prolapse 05/30/2021   Pain in joint of left shoulder 04/13/2019   Aftercare 03/22/2018   History of total knee replacement, right 03/22/2018   Pain in right knee 03/16/2018   Primary osteoarthritis of right knee 02/26/2018   S/P knee replacement 02/26/2018   Osteoarthritis 02/17/2018   S/P Nissen fundoplication (without gastrostomy tube) procedure 06/29/2015   Upper airway cough syndrome 03/14/2015   Dyspnea 03/13/2015   Chronic venous insufficiency 07/21/2014   Lymphedema of arm 08/11/2011    Procedures performed: Procedure(s): XI ROBOTIC ASSISTED LAPAROSCOPIC SACROCOLPOPEXY  History and Physical: For full details, please see admission history and physical. Briefly, Meghan Avila is a 70 y.o. year old patient with symptomatic pelvic organ prolapse.   Hospital Course: Patient tolerated the procedure well.  She was then transferred to the floor after an uneventful PACU stay.  Her hospital course was uncomplicated.  On POD#1 she had met discharge criteria: was eating a regular diet, was up and ambulating independently,  pain was well controlled, was voiding without a catheter, and was ready to for discharge.   Laboratory values:  Recent Labs    05/30/21 1212 05/31/21 0443  HGB 12.6 10.6*  HCT 39.2 32.4*   Recent Labs    05/31/21 0443  NA 140  K 3.3*  CL 109  CO2 27  GLUCOSE 130*  BUN 9  CREATININE 0.48  CALCIUM 8.5*   No results for input(s): LABPT, INR in the last 72 hours. No results for input(s): LABURIN in the last 72 hours. Results for orders placed or performed in visit on 05/27/21  SARS Coronavirus 2 (TAT 6-24 hrs)     Status: None   Collection Time: 05/27/21 12:00 AM  Result Value Ref Range Status   SARS  Coronavirus 2 RESULT: NEGATIVE  Final    Comment: RESULT: NEGATIVESARS-CoV-2 INTERPRETATION:A NEGATIVE  test result means that SARS-CoV-2 RNA was not present in the specimen above the limit of detection of this test. This does not preclude a possible SARS-CoV-2 infection and should not be used as the  sole basis for patient management decisions. Negative results must be combined with clinical observations, patient history, and epidemiological information. Optimum specimen types and timing for peak viral levels during infections caused by SARS-CoV-2  have not been determined. Collection of multiple specimens or types of specimens may be necessary to detect virus. Improper specimen collection and handling, sequence variability under primers/probes, or organism present below the limit of detection may  lead to false negative results. Positive and negative predictive values of testing are highly dependent on prevalence. False negative test results are more likely when prevalence of disease is high.The expected result is NEGATIVE.Fact S heet for  Healthcare Providers: LocalChronicle.no Sheet for Patients: SalonLookup.es Reference Range - Negative     Disposition: Home  Discharge instruction: The patient was instructed to be ambulatory but told to refrain from heavy lifting, strenuous activity, or driving.  Discharge medications:  Allergies as of 05/31/2021       Reactions   Zantac [ranitidine Hcl] Anaphylaxis   Morphine And Related Nausea And Vomiting   Morphine, Hydrocodone Tolerates Dilaudid, Fentanyl per Epic   Prednisone Diarrhea, Nausea And Vomiting   Tramadol Nausea And Vomiting   AMS        Medication  List     STOP taking these medications    ADULT ONE DAILY GUMMIES PO   aspirin-acetaminophen-caffeine 250-250-65 MG tablet Commonly known as: EXCEDRIN MIGRAINE   CALCIUM/VITAMIN D3/ADULT GUMMY PO   Vitamin C Gummies  125 MG Chew Generic drug: Ascorbic Acid   Vitamin D (Ergocalciferol) 1.25 MG (50000 UNIT) Caps capsule Commonly known as: DRISDOL       TAKE these medications    atorvastatin 10 MG tablet Commonly known as: LIPITOR Take 10 mg by mouth in the morning.   diclofenac sodium 1 % Gel Commonly known as: VOLTAREN Apply 1 application topically 2 (two) times daily as needed (KNEE PAIN).   docusate sodium 100 MG capsule Commonly known as: COLACE Take 1 capsule (100 mg total) by mouth 2 (two) times daily.   ketorolac 10 MG tablet Commonly known as: TORADOL Take 1 tablet (10 mg total) by mouth every 6 (six) hours as needed for moderate pain.   omeprazole 20 MG capsule Commonly known as: PRILOSEC Take 20 mg by mouth in the morning.        Followup:   Follow-up Information     Hollace Hayward, NP Follow up on 06/13/2021.   Why: at 11:00 Contact information: [address] [place] [address] [phone]

## 2021-05-31 NOTE — Progress Notes (Signed)
See procedure note.

## 2021-05-31 NOTE — Progress Notes (Signed)
Pt has voided x3 without difficulty. Post void bladder scan reads 149m. MD aware. Ok to d/c home per MD.

## 2021-06-03 NOTE — Addendum Note (Signed)
Addended by: Star Age on: 06/03/2021 02:14 PM   Modules accepted: Orders

## 2021-06-03 NOTE — Procedures (Signed)
[facility]  HOME SLEEP TEST (Watch PAT) REPORT  STUDY DATE: 05/22/21  DOB: [birth year]  MRN: [id number]  ORDERING CLINICIAN: Star Age, MD, PhD   REFERRING CLINICIAN: Kathyrn Lass, MD   CLINICAL INFORMATION/HISTORY: 70 year old right-handed woman with an underlying medical history of reflux disease, osteoporosis, peripheral vascular disease, peripheral edema, vitamin D deficiency, diverticulosis, arthritis with status post right knee replacement, scoliosis with status post rod placement at age 14 with removal at age 55, borderline obesity, and vertigo, who reports a history of migrainous headaches for the past 2 to 3 years. She reports snoring and does not sleep well at night.  BMI: 30.7 kg/m  FINDINGS:   Sleep Summary:   Total Recording Time (hours, min): 9 hours, 54 minutes  Total Sleep Time (hours, min):  8 hours, 34 minutes   Percent REM (%):    20.9%   Respiratory Indices:   Calculated pAHI (per hour):  38.9/hour         REM pAHI:    69.1/hour       NREM pAHI: 30.8/hour  Oxygen Saturation Statistics:    Oxygen Saturation (%) Mean: 92%   Minimum oxygen saturation (%):                 77%   O2 Saturation Range (%): 77-98%    O2 Saturation (minutes) <=88%: 9.7 min  Pulse Rate Statistics:   Pulse Mean (bpm):    69/min    Pulse Range (55-125/min)   IMPRESSION: OSA (obstructive sleep apnea), severe  RECOMMENDATION:  This home sleep test demonstrates severe obstructive sleep apnea with a total AHI of 38.9/hour and O2 nadir of 77%. Treatment with positive airway pressure is highly recommended. This will require - ideally - a full night CPAP titration study for proper treatment settings, O2 monitoring and mask fitting. For now, the patient will be advised to proceed with an autoPAP titration/trial at home.  A laboratory attended titration study can be considered in the future for optimization of treatment and better tolerance of therapy.   Alternative treatment options are limited secondary to the severity of the patient's sleep disordered breathing, but may include dental treatment with a dental device or surgical options.  Concomitant weight loss is recommended.  Please note, that untreated obstructive sleep apnea may carry additional perioperative morbidity. Patients with significant obstructive sleep apnea should receive perioperative PAP therapy and the surgeons and particularly the anesthesiologist should be informed of the diagnosis and the severity of the sleep disordered breathing. The patient should be cautioned not to drive, work at heights, or operate dangerous or heavy equipment when tired or sleepy. Review and reiteration of good sleep hygiene measures should be pursued with any patient. Other causes of the patient's symptoms, including circadian rhythm disturbances, an underlying mood disorder, medication effect and/or an underlying medical problem cannot be ruled out based on this test. Clinical correlation is recommended. The patient and her referring provider will be notified of the test results. The patient will be seen in follow up in sleep clinic at [facility].  I certify that I have reviewed the raw data recording prior to the issuance of this report in accordance with the standards of the American Academy of Sleep Medicine (AASM).  INTERPRETING PHYSICIAN:   Star Age, MD, PhD  Board Certified in Neurology and Sleep Medicine  [facility] [address], [place], [place] [address] [phone]

## 2021-06-12 ENCOUNTER — Telehealth: Payer: Self-pay | Admitting: *Deleted

## 2021-06-12 ENCOUNTER — Encounter: Payer: Self-pay | Admitting: *Deleted

## 2021-06-12 NOTE — Telephone Encounter (Signed)
-----   Message from Star Age, MD sent at 06/03/2021  2:14 PM EDT ----- Patient referred by Dr. Sabra Heck, seen by me on 04/18/2021, patient had a HST on 05/22/2021.    Please call and notify the patient that the recent home sleep test showed obstructive sleep apnea in the severe range. I recommend treatment for this in the form of autoPAP, which means, that we don't have to bring her in for a sleep study with CPAP, but will let her start using a so called autoPAP machine at home, through a DME company (of her choice, or as per insurance requirement). The DME representative will fit the patient with a mask of choice, educate her on how to use the machine, how to put the mask on, etc. I have placed an order in the chart. Please send the order to a local DME, talk to patient, send report to referring MD. Please also reinforce the need for compliance with treatment. We will need a FU in sleep clinic for 10 weeks post-PAP set up, please arrange that with me or one of our NPs. Thanks,   Star Age, MD, PhD [facility])

## 2021-06-12 NOTE — Telephone Encounter (Signed)
Results routed to Dr. Sabra Heck.  Spoke with the patient and discussed the sleep study results as noted below by Dr Rexene Alberts.  Patient understands her home sleep test showed obstructive sleep apnea in the severe range.  Recommendation is to treat this with AutoPap.  Patient is amenable to start the process.  She would like the order to be sent to [facility] where they will conduct the benefits review.  Patient understands [facility] will be her initial contact for troubleshooting the machine or mask.  She understands they will show her how to use the machine, put the mask on, we will get her fitted with a mask of her choice.  Patient prefers to schedule her initial follow-up appointment when she receives her machine.  She understands insurance requires an appointment between 31 to 89 days after starting the machine.  Patient also aware of insurance requirement to use the machine at least 4 hours every night with a goal being all night long if possible.  She confirmed her address for me to send a letter to her home summarizing our call and the plan.  Her questions were answered and she verbalized appreciation.  Letter mailed to pt.

## 2021-06-13 DIAGNOSIS — N8110 Cystocele, unspecified: Secondary | ICD-10-CM | POA: Diagnosis not present

## 2021-06-20 DIAGNOSIS — N8110 Cystocele, unspecified: Secondary | ICD-10-CM | POA: Diagnosis not present

## 2021-06-24 DIAGNOSIS — E559 Vitamin D deficiency, unspecified: Secondary | ICD-10-CM | POA: Diagnosis not present

## 2021-06-24 DIAGNOSIS — Z0000 Encounter for general adult medical examination without abnormal findings: Secondary | ICD-10-CM | POA: Diagnosis not present

## 2021-06-24 DIAGNOSIS — Z79899 Other long term (current) drug therapy: Secondary | ICD-10-CM | POA: Diagnosis not present

## 2021-06-24 DIAGNOSIS — Z1389 Encounter for screening for other disorder: Secondary | ICD-10-CM | POA: Diagnosis not present

## 2021-06-24 DIAGNOSIS — I2510 Atherosclerotic heart disease of native coronary artery without angina pectoris: Secondary | ICD-10-CM | POA: Diagnosis not present

## 2021-06-24 DIAGNOSIS — E669 Obesity, unspecified: Secondary | ICD-10-CM | POA: Diagnosis not present

## 2021-06-24 DIAGNOSIS — Z23 Encounter for immunization: Secondary | ICD-10-CM | POA: Diagnosis not present

## 2021-06-26 ENCOUNTER — Other Ambulatory Visit: Payer: Self-pay | Admitting: Family Medicine

## 2021-06-26 DIAGNOSIS — M810 Age-related osteoporosis without current pathological fracture: Secondary | ICD-10-CM

## 2021-06-26 DIAGNOSIS — Z1231 Encounter for screening mammogram for malignant neoplasm of breast: Secondary | ICD-10-CM

## 2021-07-04 DIAGNOSIS — N8110 Cystocele, unspecified: Secondary | ICD-10-CM | POA: Diagnosis not present

## 2021-07-15 DIAGNOSIS — H40013 Open angle with borderline findings, low risk, bilateral: Secondary | ICD-10-CM | POA: Diagnosis not present

## 2021-07-15 DIAGNOSIS — H43812 Vitreous degeneration, left eye: Secondary | ICD-10-CM | POA: Diagnosis not present

## 2021-07-15 DIAGNOSIS — H2513 Age-related nuclear cataract, bilateral: Secondary | ICD-10-CM | POA: Diagnosis not present

## 2021-07-16 DIAGNOSIS — G4733 Obstructive sleep apnea (adult) (pediatric): Secondary | ICD-10-CM | POA: Diagnosis not present

## 2021-08-02 ENCOUNTER — Telehealth: Payer: Self-pay | Admitting: Neurology

## 2021-08-02 DIAGNOSIS — D171 Benign lipomatous neoplasm of skin and subcutaneous tissue of trunk: Secondary | ICD-10-CM | POA: Diagnosis not present

## 2021-08-02 DIAGNOSIS — Z789 Other specified health status: Secondary | ICD-10-CM

## 2021-08-02 DIAGNOSIS — G4733 Obstructive sleep apnea (adult) (pediatric): Secondary | ICD-10-CM

## 2021-08-02 DIAGNOSIS — Z6831 Body mass index (BMI) 31.0-31.9, adult: Secondary | ICD-10-CM | POA: Diagnosis not present

## 2021-08-02 NOTE — Telephone Encounter (Signed)
Pt called stating that an MRI order is needing to be put in for her. Please advise.

## 2021-08-05 NOTE — Telephone Encounter (Signed)
Per July 2022 phone note, recommend repeat brain MRI in 6 months.

## 2021-08-05 NOTE — Telephone Encounter (Signed)
I called pt and relayed that per autopap DL, shows high leak,  recommends DME mask refit and reduction of pressures.  Pt has had back surgery and cannot really sleep on her back, is claustrophobic with masks (full).  I relayed to see how she does on lower pressures, call in 2 wks can do another DL and if still issue, may need to see if another mask would help.  Other options of failed cpap, may be suggested.  She appreciated call back. Faxed to [employer] For pressure changes.

## 2021-08-05 NOTE — Telephone Encounter (Signed)
Unless she has new symptoms, we will address the brain MRI at the next appointment.  Please notify patient.

## 2021-08-05 NOTE — Telephone Encounter (Signed)
I reviewed patient's compliance data for the past 2 weeks.  Patient has been fairly good with her compliance, average AHI 2.7/h, 95th percentile of pressure at 11.3 cm with a range of 5 to 12 cm with EPR of 3.  Leak on the higher side.  95th percentile of leak is at 39 L/min.  Please advise patient to make an appointment for a mask refit with her DME provider.  I have also requested a pressure reduction.  Please send order to her DME.

## 2021-08-05 NOTE — Telephone Encounter (Signed)
Fax confirmation received, [employer] [facility].

## 2021-08-05 NOTE — Addendum Note (Signed)
Addended by: Star Age on: 08/05/2021 02:16 PM   Modules accepted: Orders

## 2021-08-05 NOTE — Telephone Encounter (Signed)
Spoke with pt. She is fine to f/u on brain MRI at 10/09/21 appt. She also mentioned she cannot tolerate her CPAP. The air pressure feels too high, feels like a tornado. She states [employer] suggested she call our office for pressure change. She cannot lie on her back long due to spinal surgery and cannot tolerate CPAP on her side. Uses nasal mask, cannot tolerate full face mask. I let her know we could check compliance report to see if changes can be made to pressure. Pt very appreciative.

## 2021-08-16 DIAGNOSIS — M810 Age-related osteoporosis without current pathological fracture: Secondary | ICD-10-CM | POA: Diagnosis not present

## 2021-08-16 DIAGNOSIS — Z01419 Encounter for gynecological examination (general) (routine) without abnormal findings: Secondary | ICD-10-CM | POA: Diagnosis not present

## 2021-08-16 DIAGNOSIS — N8110 Cystocele, unspecified: Secondary | ICD-10-CM | POA: Diagnosis not present

## 2021-08-16 DIAGNOSIS — G4733 Obstructive sleep apnea (adult) (pediatric): Secondary | ICD-10-CM | POA: Diagnosis not present

## 2021-08-19 DIAGNOSIS — R3915 Urgency of urination: Secondary | ICD-10-CM | POA: Diagnosis not present

## 2021-08-21 ENCOUNTER — Telehealth: Payer: Self-pay | Admitting: Neurology

## 2021-08-21 DIAGNOSIS — G4733 Obstructive sleep apnea (adult) (pediatric): Secondary | ICD-10-CM

## 2021-08-21 NOTE — Telephone Encounter (Signed)
I am not convinced that the headaches are from the AutoPap.  The minimum pressure cannot be lowered any further, we can reduce the maximum pressure to 9 cm.

## 2021-08-21 NOTE — Telephone Encounter (Signed)
Faxed to [facility] new cpap pressure order.  Received confirmation back.

## 2021-08-21 NOTE — Telephone Encounter (Signed)
Pt called states she is getting a headache every morning when she wakes up. She contacted her DME they informed her to call us, thinks her pressure on her CPAP needs to be lowered again. Pt requesting a call back.

## 2021-09-03 DIAGNOSIS — M6281 Muscle weakness (generalized): Secondary | ICD-10-CM | POA: Diagnosis not present

## 2021-09-03 DIAGNOSIS — N8110 Cystocele, unspecified: Secondary | ICD-10-CM | POA: Diagnosis not present

## 2021-09-03 DIAGNOSIS — R151 Fecal smearing: Secondary | ICD-10-CM | POA: Diagnosis not present

## 2021-09-06 ENCOUNTER — Other Ambulatory Visit: Payer: Self-pay | Admitting: Family Medicine

## 2021-09-06 DIAGNOSIS — Z1231 Encounter for screening mammogram for malignant neoplasm of breast: Secondary | ICD-10-CM

## 2021-09-12 ENCOUNTER — Ambulatory Visit: Payer: PPO

## 2021-09-12 ENCOUNTER — Other Ambulatory Visit: Payer: Self-pay

## 2021-09-12 ENCOUNTER — Ambulatory Visit
Admission: RE | Admit: 2021-09-12 | Discharge: 2021-09-12 | Disposition: A | Discharge status: Home / Self Care | Payer: PPO | Source: Ambulatory Visit | Attending: Family Medicine | Admitting: Family Medicine

## 2021-09-12 DIAGNOSIS — Z1231 Encounter for screening mammogram for malignant neoplasm of breast: Secondary | ICD-10-CM

## 2021-09-15 DIAGNOSIS — G4733 Obstructive sleep apnea (adult) (pediatric): Secondary | ICD-10-CM | POA: Diagnosis not present

## 2021-09-17 ENCOUNTER — Other Ambulatory Visit: Payer: Self-pay | Admitting: Family Medicine

## 2021-09-17 DIAGNOSIS — R928 Other abnormal and inconclusive findings on diagnostic imaging of breast: Secondary | ICD-10-CM

## 2021-09-26 DIAGNOSIS — M6281 Muscle weakness (generalized): Secondary | ICD-10-CM | POA: Diagnosis not present

## 2021-09-26 DIAGNOSIS — R159 Full incontinence of feces: Secondary | ICD-10-CM | POA: Diagnosis not present

## 2021-09-26 DIAGNOSIS — K5900 Constipation, unspecified: Secondary | ICD-10-CM | POA: Diagnosis not present

## 2021-09-26 DIAGNOSIS — M6289 Other specified disorders of muscle: Secondary | ICD-10-CM | POA: Diagnosis not present

## 2021-09-26 DIAGNOSIS — M62838 Other muscle spasm: Secondary | ICD-10-CM | POA: Diagnosis not present

## 2021-10-01 DIAGNOSIS — I2510 Atherosclerotic heart disease of native coronary artery without angina pectoris: Secondary | ICD-10-CM | POA: Diagnosis not present

## 2021-10-01 DIAGNOSIS — M179 Osteoarthritis of knee, unspecified: Secondary | ICD-10-CM | POA: Diagnosis not present

## 2021-10-01 DIAGNOSIS — M810 Age-related osteoporosis without current pathological fracture: Secondary | ICD-10-CM | POA: Diagnosis not present

## 2021-10-09 ENCOUNTER — Encounter: Payer: Self-pay | Admitting: Neurology

## 2021-10-09 ENCOUNTER — Other Ambulatory Visit: Payer: Self-pay

## 2021-10-09 ENCOUNTER — Ambulatory Visit: Payer: PPO | Admitting: Neurology

## 2021-10-09 VITALS — BP 114/78 | HR 84 | Ht 60.0 in | Wt 157.0 lb

## 2021-10-09 DIAGNOSIS — R519 Headache, unspecified: Secondary | ICD-10-CM | POA: Diagnosis not present

## 2021-10-09 DIAGNOSIS — Z789 Other specified health status: Secondary | ICD-10-CM

## 2021-10-09 DIAGNOSIS — E237 Disorder of pituitary gland, unspecified: Secondary | ICD-10-CM | POA: Diagnosis not present

## 2021-10-09 DIAGNOSIS — G4733 Obstructive sleep apnea (adult) (pediatric): Secondary | ICD-10-CM | POA: Diagnosis not present

## 2021-10-09 DIAGNOSIS — Z9989 Dependence on other enabling machines and devices: Secondary | ICD-10-CM

## 2021-10-09 MED ORDER — GABAPENTIN 100 MG PO CAPS: 100.0000 mg | ORAL_CAPSULE | Freq: Every evening | ORAL | 3 refills | Status: DC | PRN

## 2021-10-09 NOTE — Patient Instructions (Addendum)
Please continue using your autoPAP regularly. While your insurance requires that you use PAP at least 4 hours each night on 70% of the nights, I recommend, that you not skip any nights and use it throughout the night if you can. Getting used to PAP and staying with the treatment long term does take time and patience and discipline. Untreated obstructive sleep apnea when it is moderate to severe can have an adverse impact on cardiovascular health and raise her risk for heart disease, arrhythmias, hypertension, congestive heart failure, stroke and diabetes. Untreated obstructive sleep apnea causes sleep disruption, nonrestorative sleep, and sleep deprivation. This can have an impact on your day to day functioning and cause daytime sleepiness and impairment of cognitive function, memory loss, mood disturbance, and problems focussing. Using PAP regularly can improve these symptoms. I will write for a different style of mask called nasal pillows.  I will order a repeat brain MRI so we can compare with previous findings from 6 months ago.  There was an abnormality with your pituitary gland.  We will call you with the results.  For recurrent headaches and difficulty sleeping at night, start Neurontin (gabapentin) 100 mg at bedtime as needed.   The most common side effects reported are sedation or sleepiness. Rare side effects include balance problems, confusion.    Please follow-up in 3 months to see one of our nurse practitioners

## 2021-10-09 NOTE — Progress Notes (Addendum)
Subjective:  °  °Patient ID: Meghan Avila is a 71 y.o. female. ° °HPI ° ° ° °Interim history:  ° °Meghan Avila is a 71-year-old right-handed woman with an underlying medical history of reflux disease, osteoporosis, peripheral vascular disease, peripheral edema, vitamin D deficiency, diverticulosis, arthritis with status post right knee replacement, scoliosis (s/p rod placement at age 18, s/p rod removal at age 21), borderline obesity, and vertigo, who presents for follow-up consultation of her recurrent vertigo and headaches as well as sleep apnea.  The patient is unaccompanied today.  I first met her at the request of her primary care physician on 04/18/2021, at which time she reported a 2 to 3-year history of migrainous headaches.  He also reported intermittent vertigo attacks since April 2022.  She had worked with physical therapy and had seen ENT.  I suggested we proceed with a sleep study as she reported snoring and morning headaches as well as not sleeping well at night.  She had a home sleep test on 05/22/2021, which indicated severe obstructive sleep apnea with an AHI of 38.9/h, O2 nadir 77%.  She was advised to start AutoPap therapy.  Her set up date was 07/16/2021.  She has a ResMed air sense 11.   °She had recently undergone a brain MRI without contrast, and I reviewed the results: IMPRESSION: °1. No evidence of acute infarct. °2. Thickening of the pituitary infundibulum. Recommend pituitary °protocol MRI with contrast to further characterize and to exclude a °mass. °3. Mild to moderate chronic microvascular ischemic disease and mild °atrophy. °4. Small right mastoid effusion. ° °Given the findings of the pituitary infundibulum she had a repeat brain MRI with and without contrast on 04/29/2021 and I reviewed the results:   °IMPRESSION: °1. Abnormal thickening of the infundibulum, which appears °intrinsically T1 hyperintense posteriorly. Additionally, a normal T1 °bright spot in the posterior  pituitary is not identified. These °findings most likely are related to an ectopic posterior pituitary °or a benign proteinaceous cyst (such as a Rathke's cleft cyst). °Other etiologies such as an infundibular malignancy or cellular °infiltrate are thought unlikely given the intrinsic T1 signal. A °follow-up pituitary protocol MRI in approximately 6 months may be °useful to ensure stability. Also, consider correlation with °pituitary function labs. °2. No abnormal enhancement elsewhere. ° ° °Today, 10/09/21: I reviewed her AutoPap compliance data from 09/08/2021 through 10/08/2021, which is a total of 30 days, during which time she used her machine 29 days with percent used days greater than 4 hours at 90%, indicating excellent compliance with an average usage of 6 hours and 23 minutes, residual AHI at goal at 1.6/h, leak on the high side with a 95th percentile at 28.9 L/min, 95th percentile of press, range ofsure at 8.8 cm, range of 4-9 cm. We reduced her Maximum pressure in November to 9 cm from 10 cm.  She felt that she was waking up with headaches.  She reports that she does not sleep well with the AutoPap and struggles with it.  She cannot sleep on her back because of her scoliosis and when she sleeps on her side the mask dislodges and sometimes she just takes it off in the middle of the night. She is motivated to continue with treatment but is still struggling.  She takes melatonin every night.  She has had recurrent headaches, they are not always severe but she has taken Excedrin migraine or tension headaches.  She has not had an actual bout of vertigo but sometimes she   feels that a vertigo attack may come on when she turns in bed.  The patient's allergies, current medications, family history, past medical history, past social history, past surgical history and problem list were reviewed and updated as appropriate.     Previously:   04/18/21: (She) reports a history of migrainous headaches for the past 2 to  3 years.  When she first started having migraines her severity was worse.  Lately, she has up to 3 or 4 migraine headaches per month.  She is trying to keep a log.  She describes a throbbing headache, it is primarily in the frontal areas bilaterally, denies any temporal tenderness or neck pain.  She has suffered from intermittent vertigo.  She has completed physical therapy for 4 weeks, without significant improvement.  Her vertigo attacks started back in April 2022 when she had another attack in May 2022.  Symptoms lasted for several days in May.  They were shorter in April.  She had associated nausea and headaches and vertigo do not typically come together.  She has had intermittent tingling and numbness in her right hand but it seems to be independent of her headaches.  Excedrin Migraine helps her headaches, it does not last several hours or days.  In fact, after taking Excedrin and staying in bed she has no significant residual headache.  She has woken up with a headache typically, headaches are not strictly one-sided.  She had an eye examination last year and is due for October 2022 for her routine follow-up.  She does not have any new visual symptoms no sudden onset of one-sided weakness or numbness or tingling or droopy face or slurring of speech other than intermittent tingling and numbness in her right hand.  She has not fallen.  She limits her caffeine to 1 cup of coffee.  She drinks alcohol rarely.  She quit smoking some 36 years ago.  She lives alone, husband died some 8 years ago, her only son lives with his family in [place].  She does not hydrate very well.  She estimates that she drinks about 3 to 4 cups of water per day.     Of note, she thinks she snores when she sleeps on her back.  She has nocturia about once or twice per average night.  She does not sleep well at night.  She takes melatonin.  She would be reluctant to come in for sleep study but would be willing to consider a home sleep  test.   I reviewed your office note from 04/12/2021.  She has had several bouts of vertigo in the past several months.  She saw Dr. Melida Quitter in ENT in June 2022 and hearing testing as well as Marye Round testing was recommended.  She has had mild hearing loss.  She had further testing through audiology on 04/05/2021 and was found to have mild hearing loss on the right.  Dix-Hallpike was positive bilaterally.  She has had intermittent numbness in her hand.  You ordered a brain MRI.  She had a brain MRI without contrast at [facility] on 04/17/2021 and I was able to review some of the images through the PACS system but not all of them.  A formal report is pending.  I did not see any obvious abnormality.   She previously had physical therapy with vestibular rehab with modest results.  She was treated for COVID in May 2022 with Paxlovid.   She reports a family history of migraines affecting  her mom and her sister.  She also reports stress, she helps take care of her 79 year old mother.  Her Past Medical History Is Significant For: Past Medical History:  Diagnosis Date   Arthritis    Family history of adverse reaction to anesthesia    post nausea and vomitting   GERD (gastroesophageal reflux disease)    hx of   H/O wheezing    occ.   Hand swelling    related to lymph nodes    Headache    migraines   History of hiatal hernia    Leg swelling    left leg -edema knee level to ankle.   Osteoporosis    Peripheral vascular disease (HCC)    left leg    PONV (postoperative nausea and vomiting)    Scoliosis    Vertigo     Her Past Surgical History Is Significant For: Past Surgical History:  Procedure Laterality Date   ANAL RECTAL MANOMETRY N/A 08/03/2019   Procedure: ANO RECTAL MANOMETRY;  Surgeon: Leighton Ruff, MD;  Location: [facility];  Service: Endoscopy;  Laterality: N/A;   BACK SURGERY     scoliosis surgery 1970 and [place]   laparoscopic   COLONOSCOPY WITH PROPOFOL N/A 04/19/2015   Procedure: COLONOSCOPY WITH PROPOFOL;  Surgeon: Garlan Fair, MD;  Location: [facility];  Service: Endoscopy;  Laterality: N/A;   DEEP AXILLARY SENTINEL NODE BIOPSY / EXCISION     x2   ESOPHAGEAL MANOMETRY N/A 05/04/2015   Procedure: ESOPHAGEAL MANOMETRY (EM);  Surgeon: Garlan Fair, MD;  Location: [facility];  Service: Endoscopy;  Laterality: N/A;   ESOPHAGOGASTRODUODENOSCOPY (EGD) WITH PROPOFOL N/A 04/19/2015   Procedure: ESOPHAGOGASTRODUODENOSCOPY (EGD) WITH PROPOFOL;  Surgeon: Garlan Fair, MD;  Location: [facility];  Service: Endoscopy;  Laterality: N/A;   HEMORROIDECTOMY  2002, 2005   hiatal hernia surgery      JOINT REPLACEMENT     total right knee Dr. Theda Sers 02-26-18   LASER ABLATION  2008   left leg    left knee surgery - torn meniscus (left)     LIPOMA EXCISION N/A 09/21/2015   Procedure: EXCISION 6CM BACK LIPOMA;  Surgeon: Ralene Ok, MD;  Location: [facility];  Service: General;  Laterality: N/A;   MENISCUS REPAIR     right   REDUCTION MAMMAPLASTY Bilateral 1998   right shoulder rotator cuff surgery      ROBOTIC ASSISTED LAPAROSCOPIC SACROCOLPOPEXY N/A 05/30/2021   Procedure: XI ROBOTIC ASSISTED LAPAROSCOPIC SACROCOLPOPEXY;  Surgeon: Ardis Hughs, MD;  Location: [facility];  Service: Urology;  Laterality: N/A;   scoliosis  1970, 1972   no retained hardware   TOTAL KNEE ARTHROPLASTY Right 02/26/2018   Procedure: RIGHT TOTAL KNEE ARTHROPLASTY;  Surgeon: Sydnee Cabal, MD;  Location: [facility];  Service: Orthopedics;  Laterality: Right;    Her Family History Is Significant For: Family History  Problem Relation Age of Onset   Varicose Veins Mother    Rheum arthritis Father    Cancer Sister    Heart disease Sister        before age 62   Hypertension Sister    Varicose Veins Sister    Heart attack Sister    Hyperlipidemia Brother    Rheum arthritis Sister    Allergies Sister     Breast cancer Neg Hx     Her Social History Is Significant For: Social History   Socioeconomic History  Marital status: Widowed    Spouse name: Not on file   Number of children: Not on file   Years of education: Not on file   Highest education level: Not on file  Occupational History   Occupation: Payroll Admin   Tobacco Use   Smoking status: Former    Packs/day: 1.00    Years: 5.00    Pack years: 5.00    Types: Cigarettes    Quit date: 10/06/1978    Years since quitting: 43.0   Smokeless tobacco: Never  Vaping Use   Vaping Use: Never used  Substance and Sexual Activity   Alcohol use: Yes    Comment: occ beer or glass of wine   Drug use: No   Sexual activity: Yes  Other Topics Concern   Not on file  Social History Narrative   Lives at home alone   Right handed   Caffeine: 1-2 cups/day   Social Determinants of Health   Financial Resource Strain: Not on file  Food Insecurity: Not on file  Transportation Needs: Not on file  Physical Activity: Not on file  Stress: Not on file  Social Connections: Not on file    Her Allergies Are:  Allergies  Allergen Reactions   Zantac [Ranitidine Hcl] Anaphylaxis   Morphine And Related Nausea And Vomiting    Morphine, Hydrocodone Tolerates Dilaudid, Fentanyl per Epic   Prednisone Diarrhea and Nausea And Vomiting   Tramadol Nausea And Vomiting    AMS  :   Her Current Medications Are:  Outpatient Encounter Medications as of 10/09/2021  Medication Sig   atorvastatin (LIPITOR) 10 MG tablet Take 10 mg by mouth in the morning.   diclofenac sodium (VOLTAREN) 1 % GEL Apply 1 application topically 2 (two) times daily as needed (KNEE PAIN).   [DISCONTINUED] docusate sodium (COLACE) 100 MG capsule Take 1 capsule (100 mg total) by mouth 2 (two) times daily.   [DISCONTINUED] ketorolac (TORADOL) 10 MG tablet Take 1 tablet (10 mg total) by mouth every 6 (six) hours as needed for moderate pain.   [DISCONTINUED] omeprazole (PRILOSEC) 20 MG  capsule Take 20 mg by mouth in the morning.   No facility-administered encounter medications on file as of 10/09/2021.  :  Review of Systems:  Out of a complete 14 point review of systems, all are reviewed and negative with the exception of these symptoms as listed below:   Review of Systems  Neurological:        Patient is here for initial CPAP follow-up. She does not like it. She states that due to scoliosis she has a hard time lying on her back. When she is on her side she has to try to hold the mask on her face so it doesn't come off.    Objective:  Neurological Exam  Physical Exam Physical Examination:   Vitals:   10/09/21 1431  BP: 114/78  Pulse: 84    General Examination: The patient is a very pleasant 71 y.o. female in no acute distress. She appears well-developed and well-nourished and well groomed.   HEENT: Normocephalic, atraumatic, pupils are equal, round and reactive to light and accommodation.  Corrective eyeglasses in place. Extraocular tracking is good without limitation to gaze excursion or nystagmus noted. Normal smooth pursuit is noted. Hearing is grossly intact. Face is symmetric with normal facial animation. Speech is clear with no dysarthria noted. There is no hypophonia. There is no lip, neck/head, jaw or voice tremor. Neck is supple with full range of  passive and active motion. There are no carotid bruits on auscultation. Oropharynx exam reveals: mild mouth dryness, adequate dental hygiene and mild airway crowding, tongue protrudes centrally and palate elevates symmetrically. °  °Chest: Clear to auscultation without wheezing, rhonchi or crackles noted. °  °Heart: S1+S2+0, regular and normal without murmurs, rubs or gallops noted.  °  °Abdomen: Soft, non-tender and non-distended. °  °Extremities: There is edema in the left distal lower extremity, mild edema in the right distal lower extremity.  °  °Skin: Warm and dry without trophic changes noted.  °  °Musculoskeletal:  exam reveals arthritic changes in both hands, right knee wider than left, status post right knee replacement.  °  °Neurologically:  °Mental status: The patient is awake, alert and oriented in all 4 spheres. Her immediate and remote memory, attention, language skills and fund of knowledge are appropriate. There is no evidence of aphasia, agnosia, apraxia or anomia. Speech is clear with normal prosody and enunciation. Thought process is linear. Mood is normal and affect is normal.  °Cranial nerves II - XII are as described above under HEENT exam.  °Motor exam: Normal bulk, strength and tone is noted. There is no  tremor.  Fine motor skills and coordination: grossly intact.  °Cerebellar testing: No dysmetria or intention tremor. There is no truncal or gait ataxia.  °Sensory exam: intact to light touch.  °Gait, station and balance: She stands easily. No veering to one side is noted. No leaning to one side is noted. Posture is age-appropriate and stance is narrow based. Gait shows normal stride length and normal pace. No problems turning are noted.  °  °Assessment and Plan:  °In summary, Meghan Avila is a very pleasant 71 y.o.-year old female with an underlying medical history of reflux disease, osteoporosis, peripheral vascular disease, peripheral edema, vitamin D deficiency, diverticulosis, arthritis with status post right knee replacement, scoliosis with status post surgery as a teenager, borderline obesity, and vertigo, who presents for follow-up consultation of her recurrent headaches.  She continues to have intermittent headaches, sometimes migraine-like.  She has not had any severe attacks of vertigo or migraines as of our last visit.  She has been diagnosed with severe obstructive sleep apnea based on a home sleep test from August 2022.  She has established AutoPap therapy since October 2022.  She is still struggling with the AutoPap machine, I suggested we change her from nasal cushion to a nasal  pillows interface.  This may help stay in place a little better when she sleeps on her sides.  We have reduced her maximum pressure, she is able to tolerate this but is still struggling with dry mouth.  She can increase the humidifier setting and she is also advised to add a room humidifier at this time.  She is advised to trial gabapentin at night to help with the headaches and also help her sleep a little better at night.  We will start with 100 mg at bedtime, she can use it as needed as well.  We talked about expectations and possible common side effects.  She is advised to continue with AutoPap therapy and follow-up to see one of our nurse practitioners in about 3 months from now.  Her brain MRI had shown an abnormality with the pituitary gland.  We we will continue to monitor symptoms.  We mutually agreed to pursue a repeat brain MRI with and without contrast to look for stability in the pituitary gland.  We will call her   with her MRI results.  I answered all her questions today and she was in agreement.   I spent 40 minutes in total face-to-face time and in reviewing records during pre-charting, more than 50% of which was spent in counseling and coordination of care, reviewing test results, reviewing medications and treatment regimen and/or in discussing or reviewing the diagnosis of OSA, recurrent headaches, pituitary abnormality, the prognosis and treatment options. Pertinent laboratory and imaging test results that were available during this visit with the patient were reviewed by me and considered in my medical decision making (see chart for details).

## 2021-10-10 NOTE — Progress Notes (Signed)
Faxed to [facility] new cpap orders with fax confirmation [phone].

## 2021-10-14 ENCOUNTER — Ambulatory Visit
Admission: RE | Admit: 2021-10-14 | Discharge: 2021-10-14 | Disposition: A | Discharge status: Home / Self Care | Payer: PPO | Source: Ambulatory Visit | Attending: Neurology | Admitting: Neurology

## 2021-10-14 ENCOUNTER — Other Ambulatory Visit: Payer: Self-pay

## 2021-10-14 DIAGNOSIS — Z789 Other specified health status: Secondary | ICD-10-CM

## 2021-10-14 DIAGNOSIS — Z9989 Dependence on other enabling machines and devices: Secondary | ICD-10-CM

## 2021-10-14 DIAGNOSIS — E237 Disorder of pituitary gland, unspecified: Secondary | ICD-10-CM

## 2021-10-14 DIAGNOSIS — R519 Headache, unspecified: Secondary | ICD-10-CM

## 2021-10-14 DIAGNOSIS — G4733 Obstructive sleep apnea (adult) (pediatric): Secondary | ICD-10-CM | POA: Diagnosis not present

## 2021-10-14 MED ORDER — GADOBENATE DIMEGLUMINE 529 MG/ML IV SOLN
7.0000 mL | Freq: Once | INTRAVENOUS | Status: AC | PRN
  Administered 2021-10-14: 7 mL via INTRAVENOUS

## 2021-10-15 DIAGNOSIS — M6289 Other specified disorders of muscle: Secondary | ICD-10-CM | POA: Diagnosis not present

## 2021-10-15 DIAGNOSIS — M62838 Other muscle spasm: Secondary | ICD-10-CM | POA: Diagnosis not present

## 2021-10-15 DIAGNOSIS — R159 Full incontinence of feces: Secondary | ICD-10-CM | POA: Diagnosis not present

## 2021-10-15 DIAGNOSIS — M6281 Muscle weakness (generalized): Secondary | ICD-10-CM | POA: Diagnosis not present

## 2021-10-15 DIAGNOSIS — K5900 Constipation, unspecified: Secondary | ICD-10-CM | POA: Diagnosis not present

## 2021-10-16 DIAGNOSIS — G4733 Obstructive sleep apnea (adult) (pediatric): Secondary | ICD-10-CM | POA: Diagnosis not present

## 2021-10-17 ENCOUNTER — Telehealth: Payer: Self-pay | Admitting: *Deleted

## 2021-10-17 NOTE — Telephone Encounter (Signed)
-----   Message from Star Age, MD sent at 10/15/2021  5:55 PM EST ----- Please call patient and advise her that her recent brain MRI showed stable findings, no evidence of a pituitary tumor, could be extra pituitary tissue.  We can certainly reevaluate in the future, I would recommend a follow-up scan just to demonstrate stability in 1 year, sooner if she has any symptoms such as new onset headaches.  She can follow-up in our clinic as scheduled.

## 2021-10-25 ENCOUNTER — Other Ambulatory Visit: Payer: PPO

## 2021-11-01 ENCOUNTER — Ambulatory Visit
Admission: RE | Admit: 2021-11-01 | Discharge: 2021-11-01 | Disposition: A | Discharge status: Home / Self Care | Payer: PPO | Source: Ambulatory Visit | Attending: Family Medicine | Admitting: Family Medicine

## 2021-11-01 DIAGNOSIS — R928 Other abnormal and inconclusive findings on diagnostic imaging of breast: Secondary | ICD-10-CM

## 2021-11-01 DIAGNOSIS — R922 Inconclusive mammogram: Secondary | ICD-10-CM | POA: Diagnosis not present

## 2021-11-05 DIAGNOSIS — K5900 Constipation, unspecified: Secondary | ICD-10-CM | POA: Diagnosis not present

## 2021-11-05 DIAGNOSIS — M6281 Muscle weakness (generalized): Secondary | ICD-10-CM | POA: Diagnosis not present

## 2021-11-05 DIAGNOSIS — M6289 Other specified disorders of muscle: Secondary | ICD-10-CM | POA: Diagnosis not present

## 2021-11-05 DIAGNOSIS — M62838 Other muscle spasm: Secondary | ICD-10-CM | POA: Diagnosis not present

## 2021-11-05 DIAGNOSIS — R159 Full incontinence of feces: Secondary | ICD-10-CM | POA: Diagnosis not present

## 2021-11-11 DIAGNOSIS — M818 Other osteoporosis without current pathological fracture: Secondary | ICD-10-CM | POA: Diagnosis not present

## 2021-11-16 DIAGNOSIS — G4733 Obstructive sleep apnea (adult) (pediatric): Secondary | ICD-10-CM | POA: Diagnosis not present

## 2021-11-19 DIAGNOSIS — R351 Nocturia: Secondary | ICD-10-CM | POA: Diagnosis not present

## 2021-11-19 DIAGNOSIS — K5900 Constipation, unspecified: Secondary | ICD-10-CM | POA: Diagnosis not present

## 2021-11-19 DIAGNOSIS — M6281 Muscle weakness (generalized): Secondary | ICD-10-CM | POA: Diagnosis not present

## 2021-11-19 DIAGNOSIS — N8110 Cystocele, unspecified: Secondary | ICD-10-CM | POA: Diagnosis not present

## 2021-11-19 DIAGNOSIS — M62838 Other muscle spasm: Secondary | ICD-10-CM | POA: Diagnosis not present

## 2021-11-19 DIAGNOSIS — R159 Full incontinence of feces: Secondary | ICD-10-CM | POA: Diagnosis not present

## 2021-11-19 DIAGNOSIS — M6289 Other specified disorders of muscle: Secondary | ICD-10-CM | POA: Diagnosis not present

## 2021-12-09 ENCOUNTER — Ambulatory Visit
Admission: RE | Admit: 2021-12-09 | Discharge: 2021-12-09 | Disposition: A | Discharge status: Home / Self Care | Payer: PPO | Source: Ambulatory Visit | Attending: Family Medicine | Admitting: Family Medicine

## 2021-12-09 ENCOUNTER — Other Ambulatory Visit: Payer: Self-pay

## 2021-12-09 DIAGNOSIS — Z780 Asymptomatic menopausal state: Secondary | ICD-10-CM | POA: Diagnosis not present

## 2021-12-09 DIAGNOSIS — M85852 Other specified disorders of bone density and structure, left thigh: Secondary | ICD-10-CM | POA: Diagnosis not present

## 2021-12-09 DIAGNOSIS — M810 Age-related osteoporosis without current pathological fracture: Secondary | ICD-10-CM

## 2021-12-12 DIAGNOSIS — L918 Other hypertrophic disorders of the skin: Secondary | ICD-10-CM | POA: Diagnosis not present

## 2021-12-12 DIAGNOSIS — D1724 Benign lipomatous neoplasm of skin and subcutaneous tissue of left leg: Secondary | ICD-10-CM | POA: Diagnosis not present

## 2021-12-12 DIAGNOSIS — D225 Melanocytic nevi of trunk: Secondary | ICD-10-CM | POA: Diagnosis not present

## 2021-12-12 DIAGNOSIS — D171 Benign lipomatous neoplasm of skin and subcutaneous tissue of trunk: Secondary | ICD-10-CM | POA: Diagnosis not present

## 2021-12-12 DIAGNOSIS — D1801 Hemangioma of skin and subcutaneous tissue: Secondary | ICD-10-CM | POA: Diagnosis not present

## 2021-12-12 DIAGNOSIS — L720 Epidermal cyst: Secondary | ICD-10-CM | POA: Diagnosis not present

## 2021-12-12 DIAGNOSIS — L821 Other seborrheic keratosis: Secondary | ICD-10-CM | POA: Diagnosis not present

## 2021-12-14 DIAGNOSIS — G4733 Obstructive sleep apnea (adult) (pediatric): Secondary | ICD-10-CM | POA: Diagnosis not present

## 2021-12-25 DIAGNOSIS — I2510 Atherosclerotic heart disease of native coronary artery without angina pectoris: Secondary | ICD-10-CM | POA: Diagnosis not present

## 2021-12-25 DIAGNOSIS — M818 Other osteoporosis without current pathological fracture: Secondary | ICD-10-CM | POA: Diagnosis not present

## 2021-12-25 DIAGNOSIS — E669 Obesity, unspecified: Secondary | ICD-10-CM | POA: Diagnosis not present

## 2022-01-07 DIAGNOSIS — M79671 Pain in right foot: Secondary | ICD-10-CM | POA: Diagnosis not present

## 2022-01-08 ENCOUNTER — Ambulatory Visit: Payer: PPO | Admitting: Adult Health

## 2022-01-08 ENCOUNTER — Encounter: Payer: Self-pay | Admitting: Adult Health

## 2022-01-08 VITALS — BP 128/75 | HR 68 | Ht 60.0 in | Wt 156.0 lb

## 2022-01-08 DIAGNOSIS — Z789 Other specified health status: Secondary | ICD-10-CM | POA: Diagnosis not present

## 2022-01-08 DIAGNOSIS — G4733 Obstructive sleep apnea (adult) (pediatric): Secondary | ICD-10-CM

## 2022-01-08 DIAGNOSIS — Z9989 Dependence on other enabling machines and devices: Secondary | ICD-10-CM

## 2022-01-08 DIAGNOSIS — R519 Headache, unspecified: Secondary | ICD-10-CM | POA: Diagnosis not present

## 2022-01-08 MED ORDER — AMITRIPTYLINE HCL 10 MG PO TABS: 10.0000 mg | ORAL_TABLET | Freq: Every day | ORAL | 3 refills | Status: DC

## 2022-01-08 NOTE — Patient Instructions (Addendum)
Would recommend trying amitriptyline '10mg'$  nightly for morning headaches. This is a low dose to help avoid potential side effects so after 1 week if no improvement, please let me know and we can increase dosage if needed ? ?Continue use of CPAP nightly  ? ? ? ?Follow up in 4 months with Dr. Rexene Alberts or call earlier if needed  ? ? ? ? ? ? ?Amitriptyline Tablets ?What is this medication? ?AMITRIPTYLINE (a mee TRIP ti leen) treats depression. It increases the amount of serotonin and norepinephrine in the brain, hormones that help regulate mood. It belongs to a group of medications called tricyclic antidepressants (TCAs). ?This medicine may be used for other purposes; ask your health care provider or pharmacist if you have questions. ?COMMON BRAND NAME(S): Elavil, Vanatrip ?What should I tell my care team before I take this medication? ?They need to know if you have any of these conditions: ?An alcohol problem ?Asthma, trouble breathing ?Bipolar disorder or schizophrenia ?Difficulty passing urine, prostate trouble ?Glaucoma ?Heart disease or previous heart attack ?Liver disease ?Over active thyroid ?Seizures ?Thoughts or plans of suicide, a previous suicide attempt, or family history of suicide attempt ?An unusual or allergic reaction to amitriptyline, other medications, foods, dyes, or preservatives ?Pregnant or trying to get pregnant ?Breast-feeding ?How should I use this medication? ?Take this medication by mouth with a drink of water. Follow the directions on the prescription label. You can take the tablets with or without food. Take your medication at regular intervals. Do not take it more often than directed. Do not stop taking this medication suddenly except upon the advice of your care team. Stopping this medication too quickly may cause serious side effects or your condition may worsen. ?A special MedGuide will be given to you by the pharmacist with each prescription and refill. Be sure to read this information  carefully each time. ?Talk to your care team regarding the use of this medication in children. Special care may be needed. ?Overdosage: If you think you have taken too much of this medicine contact a poison control center or emergency room at once. ?NOTE: This medicine is only for you. Do not share this medicine with others. ?What if I miss a dose? ?If you miss a dose, take it as soon as you can. If it is almost time for your next dose, take only that dose. Do not take double or extra doses. ?What may interact with this medication? ?Do not take this medication with any of the following: ?Arsenic trioxide ?Certain medications used to regulate abnormal heartbeat or to treat other heart conditions ?Cisapride ?Droperidol ?Halofantrine ?Linezolid ?MAOIs like Carbex, Eldepryl, Marplan, Nardil, and Parnate ?Methylene blue ?Other medications for mental depression ?Phenothiazines like perphenazine, thioridazine and chlorpromazine ?Pimozide ?Probucol ?Procarbazine ?Sparfloxacin ?[place] ?This medication may also interact with the following: ?Atropine and related medications like hyoscyamine, scopolamine, tolterodine and others ?Barbiturate medications for inducing sleep or treating seizures, like phenobarbital ?Cimetidine ?Disulfiram ?Ethchlorvynol ?Thyroid hormones such as levothyroxine ?Ziprasidone ?This list may not describe all possible interactions. Give your health care provider a list of all the medicines, herbs, non-prescription drugs, or dietary supplements you use. Also tell them if you smoke, drink alcohol, or use illegal drugs. Some items may interact with your medicine. ?What should I watch for while using this medication? ?Tell your care team if your symptoms do not get better or if they get worse. Visit your care team for regular checks on your progress. Because it may take several weeks  to see the full effects of this medication, it is important to continue your treatment as prescribed by your care  team. ?Patients and their families should watch out for new or worsening thoughts of suicide or depression. Also watch out for sudden changes in feelings such as feeling anxious, agitated, panicky, irritable, hostile, aggressive, impulsive, severely restless, overly excited and hyperactive, or not being able to sleep. If this happens, especially at the beginning of treatment or after a change in dose, call your care team. ?You may get drowsy or dizzy. Do not drive, use machinery, or do anything that needs mental alertness until you know how this medication affects you. Do not stand or sit up quickly, especially if you are an older patient. This reduces the risk of dizzy or fainting spells. Alcohol may interfere with the effect of this medication. Avoid alcoholic drinks. ?Do not treat yourself for coughs, colds, or allergies without asking your care team for advice. Some ingredients can increase possible side effects. ?Your mouth may get dry. Chewing sugarless gum or sucking hard candy, and drinking plenty of water will help. Contact your care team if the problem does not go away or is severe. ?This medication may cause dry eyes and blurred vision. If you wear contact lenses you may feel some discomfort. Lubricating drops may help. See your eye doctor if the problem does not go away or is severe. ?This medication can cause constipation. Try to have a bowel movement at least every 2 to 3 days. If you do not have a bowel movement for 3 days, call your care team. ?This medication can make you more sensitive to the sun. Keep out of the sun. If you cannot avoid being in the sun, wear protective clothing and use sunscreen. Do not use sun lamps or tanning beds/booths. ?What side effects may I notice from receiving this medication? ?Side effects that you should report to your care team as soon as possible: ?Allergic reactions--skin rash, itching, hives, swelling of the face, lips, tongue, or throat ?Heart rhythm changes--  fast or irregular heartbeat, dizziness, feeling faint or lightheaded, chest pain, trouble breathing ?Serotonin syndrome--irritability, confusion, fast or irregular heartbeat, muscle stiffness, twitching muscles, sweating, high fever, seizure, chills, vomiting, diarrhea ?Sudden eye pain or change in vision such as blurry vision, seeing halos around lights, vision loss ?Thoughts of suicide or self-harm, worsening mood, feelings of depression ?Side effects that usually do not require medical attention (report to your care team if they continue or are bothersome): ?Change in appetite or weight ?Change in sex drive or performance ?Constipation ?Dizziness ?Drowsiness ?Dry mouth ?Tremors ?This list may not describe all possible side effects. Call your doctor for medical advice about side effects. You may report side effects to FDA at 1-800-FDA-1088. ?Where should I keep my medication? ?Keep out of the reach of children and pets. ?Store at room temperature between 20 and 25 degrees C (68 and 77 degrees F). Throw away any unused medication after the expiration date. ?NOTE: This sheet is a summary. It may not cover all possible information. If you have questions about this medicine, talk to your doctor, pharmacist, or health care provider. ?? 2022 Elsevier/Gold Standard (2020-10-19 00:00:00) ? ? ? ?

## 2022-01-08 NOTE — Progress Notes (Signed)
?[facility] ?[address] ?[place]. [place] [address] ?[phone] ? ?     OFFICE FOLLOW UP NOTE ? ?Meghan Avila ?Date of Birth:  [birth year] ?Medical Record Number:  [id number]  ? ? ?Primary neurologist: Dr. Rexene Alberts ?Reason for visit: CPAP follow-up, migraine headaches ? ? ? ?SUBJECTIVE: ? ? ?CHIEF COMPLAINT:  ?Chief Complaint  ?Patient presents with  ? Follow-up  ?  Rm 2 alone ?Pt is well, states she gets headaches every day & CPAP causes sores in her nose and doesn't feel like its helping her. Feels like its putting something strange in her head   ? ? ?HPI:  ? ?Update 01/08/2022 JM: Patient returns for 39-month follow-up visit for migraine headaches and OSA with difficulty tolerating CPAP. ? ?she reports continued daily headaches upon awakening which she was not having prior to using CPAP. She does have chronic migraine headaches with last migraine headache approx 1 week ago. Recommended starting gabapentin at prior visit but did not take due to potential side effects.  Repeated MRI 10/2021 which was stable and no evidence of pituitary tumor. She also reports recent episode of vertigo requiring epley maneuver. She questions if onset of vertigo is from use of CPAP.  Reports improvement of tolerating CPAP since adjustments to pressure settings. Her main concern regarding CPAP is her continued daily morning headaches.  ? ? ? ? ? ? ? ? ? ? ?History from Dr. Guadelupe Sabin prior OV note copied for reference purposes only ?Meghan Avila is a 71 year old right-handed woman with an underlying medical history of reflux disease, osteoporosis, peripheral vascular disease, peripheral edema, vitamin D deficiency, diverticulosis, arthritis with status post right knee replacement, scoliosis (s/p rod placement at age 75, s/p rod removal at age 55), borderline obesity, and vertigo, who presents for follow-up consultation of her recurrent vertigo and headaches as well as sleep apnea.  The patient is unaccompanied  today.  I first met her at the request of her primary care physician on 04/18/2021, at which time she reported a 2 to 3-year history of migrainous headaches.  He also reported intermittent vertigo attacks since April 2022.  She had worked with physical therapy and had seen ENT.  I suggested we proceed with a sleep study as she reported snoring and morning headaches as well as not sleeping well at night.  She had a home sleep test on 05/22/2021, which indicated severe obstructive sleep apnea with an AHI of 38.9/h, O2 nadir 77%.  She was advised to start AutoPap therapy.  Her set up date was 07/16/2021.  She has a ResMed air sense 11.   ?She had recently undergone a brain MRI without contrast, and I reviewed the results: IMPRESSION: ?1. No evidence of acute infarct. ?2. Thickening of the pituitary infundibulum. Recommend pituitary ?protocol MRI with contrast to further characterize and to exclude a ?mass. ?3. Mild to moderate chronic microvascular ischemic disease and mild ?atrophy. ?4. Small right mastoid effusion. ?  ?Given the findings of the pituitary infundibulum she had a repeat brain MRI with and without contrast on 04/29/2021 and I reviewed the results:   ?IMPRESSION: ?1. Abnormal thickening of the infundibulum, which appears ?intrinsically T1 hyperintense posteriorly. Additionally, a normal T1 ?bright spot in the posterior pituitary is not identified. These ?findings most likely are related to an ectopic posterior pituitary ?or a benign proteinaceous cyst (such as a Rathke's cleft cyst). ?Other etiologies such as an infundibular malignancy or cellular ?infiltrate are thought unlikely given the intrinsic T1 signal.  A ?follow-up pituitary protocol MRI in approximately 6 months may be ?useful to ensure stability. Also, consider correlation with ?pituitary function labs. ?2. No abnormal enhancement elsewhere. ?  ?  ?Today, 10/09/21: I reviewed her AutoPap compliance data from 09/08/2021 through 10/08/2021, which is a  total of 30 days, during which time she used her machine 29 days with percent used days greater than 4 hours at 90%, indicating excellent compliance with an average usage of 6 hours and 23 minutes, residual AHI at goal at 1.6/h, leak on the high side with a 95th percentile at 28.9 L/min, 95th percentile of press, range ofsure at 8.8 cm, range of 4-9 cm. We reduced her Maximum pressure in November to 9 cm from 10 cm.  She felt that she was waking up with headaches.  She reports that she does not sleep well with the AutoPap and struggles with it.  She cannot sleep on her back because of her scoliosis and when she sleeps on her side the mask dislodges and sometimes she just takes it off in the middle of the night. She is motivated to continue with treatment but is still struggling.  She takes melatonin every night.  She has had recurrent headaches, they are not always severe but she has taken Excedrin migraine or tension headaches.  She has not had an actual bout of vertigo but sometimes she feels that a vertigo attack may come on when she turns in bed. ?  ?The patient's allergies, current medications, family history, past medical history, past social history, past surgical history and problem list were reviewed and updated as appropriate.  ?  ?  ?Previously:  ?  ?04/18/21: (She) reports a history of migrainous headaches for the past 2 to 3 years.  When she first started having migraines her severity was worse.  Lately, she has up to 3 or 4 migraine headaches per month.  She is trying to keep a log.  She describes a throbbing headache, it is primarily in the frontal areas bilaterally, denies any temporal tenderness or neck pain.  She has suffered from intermittent vertigo.  She has completed physical therapy for 4 weeks, without significant improvement.  Her vertigo attacks started back in April 2022 when she had another attack in May 2022.  Symptoms lasted for several days in May.  They were shorter in April.  She had  associated nausea and headaches and vertigo do not typically come together.  She has had intermittent tingling and numbness in her right hand but it seems to be independent of her headaches.  Excedrin Migraine helps her headaches, it does not last several hours or days.  In fact, after taking Excedrin and staying in bed she has no significant residual headache.  She has woken up with a headache typically, headaches are not strictly one-sided.  She had an eye examination last year and is due for October 2022 for her routine follow-up.  She does not have any new visual symptoms no sudden onset of one-sided weakness or numbness or tingling or droopy face or slurring of speech other than intermittent tingling and numbness in her right hand.  She has not fallen.  She limits her caffeine to 1 cup of coffee.  She drinks alcohol rarely.  She quit smoking some 36 years ago.  She lives alone, husband died some 8 years ago, her only son lives with his family in [place].  She does not hydrate very well.  She estimates that she drinks about 3  to 4 cups of water per day.   ?  ?Of note, she thinks she snores when she sleeps on her back.  She has nocturia about once or twice per average night.  She does not sleep well at night.  She takes melatonin.  She would be reluctant to come in for sleep study but would be willing to consider a home sleep test. ?  ?I reviewed your office note from 04/12/2021.  She has had several bouts of vertigo in the past several months.  She saw Dr. Melida Quitter in ENT in June 2022 and hearing testing as well as Marye Round testing was recommended.  She has had mild hearing loss.  She had further testing through audiology on 04/05/2021 and was found to have mild hearing loss on the right.  Dix-Hallpike was positive bilaterally.  She has had intermittent numbness in her hand.  You ordered a brain MRI.  She had a brain MRI without contrast at [facility] on 04/17/2021 and I was able to review some of  the images through the PACS system but not all of them.  A formal report is pending.  I did not see any obvious abnormality. ?  ?She previously had physical therapy with vestibular rehab with [facility]

## 2022-01-14 DIAGNOSIS — G4733 Obstructive sleep apnea (adult) (pediatric): Secondary | ICD-10-CM | POA: Diagnosis not present

## 2022-01-20 DIAGNOSIS — R21 Rash and other nonspecific skin eruption: Secondary | ICD-10-CM | POA: Diagnosis not present

## 2022-02-13 DIAGNOSIS — G4733 Obstructive sleep apnea (adult) (pediatric): Secondary | ICD-10-CM | POA: Diagnosis not present

## 2022-02-25 DIAGNOSIS — N390 Urinary tract infection, site not specified: Secondary | ICD-10-CM | POA: Diagnosis not present

## 2022-03-16 DIAGNOSIS — G4733 Obstructive sleep apnea (adult) (pediatric): Secondary | ICD-10-CM | POA: Diagnosis not present

## 2022-04-15 DIAGNOSIS — G4733 Obstructive sleep apnea (adult) (pediatric): Secondary | ICD-10-CM | POA: Diagnosis not present

## 2022-05-12 DIAGNOSIS — M810 Age-related osteoporosis without current pathological fracture: Secondary | ICD-10-CM | POA: Diagnosis not present

## 2022-05-14 ENCOUNTER — Ambulatory Visit: Payer: PPO | Admitting: Neurology

## 2022-05-14 VITALS — BP 144/86 | HR 64 | Ht 60.0 in | Wt 145.4 lb

## 2022-05-14 DIAGNOSIS — G4733 Obstructive sleep apnea (adult) (pediatric): Secondary | ICD-10-CM

## 2022-05-14 DIAGNOSIS — Z9989 Dependence on other enabling machines and devices: Secondary | ICD-10-CM | POA: Diagnosis not present

## 2022-05-14 DIAGNOSIS — R519 Headache, unspecified: Secondary | ICD-10-CM

## 2022-05-14 MED ORDER — AMITRIPTYLINE HCL 10 MG PO TABS: 10.0000 mg | ORAL_TABLET | Freq: Every day | ORAL | 5 refills | Status: DC

## 2022-05-14 NOTE — Progress Notes (Signed)
Subjective:    Patient ID: Meghan Avila is a 71 y.o. female.  HPI    Interim history:   Meghan Avila is a 71 year old right-handed woman with an underlying medical history of reflux disease, osteoporosis, peripheral vascular disease, peripheral edema, vitamin D deficiency, diverticulosis, arthritis with status post right knee replacement, scoliosis (s/p rod placement at age 57, s/p rod removal at age 42), borderline obesity, and vertigo, who presents for follow-up consultation of her migraine and sleep apnea, on autoPAP therapy. The patient is unaccompanied today. I last saw her on 10/09/21, at which time she reported struggling with AutoPap therapy, she had difficulty keeping the mask on.  We had changed her pressure to help with tolerance.  She was waking up with headaches.  I suggested she start gabapentin.   She saw Frann Rider, NP in the interim on 01/08/2022, at which time she was good with compliance, she reported that she had not started the gabapentin due to concern for side effects.  She had a recent episode of vertigo.  She was advised to start amitriptyline at night for migraine prevention.   Today, 05/14/2022: I reviewed her AutoPap compliance data from 04/02/2022 through 05/01/2022, which is a total of 30 days, during which time she used her machine only 19 days with percent usage days greater than 4 hours at 50%, indicating suboptimal compliance, average usage of 5 hours and 45 minutes, residual AHI: 0.5/h, 95th percentile pressure at 8.8 cm with a range of 4 to 9 cm, leak on the higher side with a 95th percentile at 27.5 L/min.  In the past 90 days, she has had a similar compliance, no usage after 05/01/2022.  Her app on the phone also reports no data transmitted.  She reports doing a little better with her CPAP.  She has adjusted better to treatment.  She does report not using her machine in the recent past, in fact, currently it is unplugged which may explain no data after 05/01/2022.   She reports recent stressors, her brother just recently moved out and he was temporarily staying with her, her older sister is about to move in permanently with her.  They also take turns and taking care of her 8 year old mom.  She is getting her bedroom carpet cleaned and is currently sleeping on the couch and has her machine unplugged. She has had intermittent vertigo symptoms, in the past, she benefited from Epley maneuvers through ENT, she has also been to PT without much in the way of sustained success.  She has not had a major migraine, continues to take amitriptyline and tolerates the low-dose and feels that it has helped.  Currently, because of her stressors she is not sleeping all that well.  She admits that she does not like to drink water. She may only drink 3 cups of water per day some days.  The patient's allergies, current medications, family history, past medical history, past social history, past surgical history and problem list were reviewed and updated as appropriate.    Previously:   I first met her at the request of her primary care physician on 04/18/2021, at which time she reported a 2 to 3-year history of migrainous headaches.  He also reported intermittent vertigo attacks since April 2022.  She had worked with physical therapy and had seen ENT.  I suggested we proceed with a sleep study as she reported snoring and morning headaches as well as not sleeping well at night.  She had a home  sleep test on 05/22/2021, which indicated severe obstructive sleep apnea with an AHI of 38.9/h, O2 nadir 77%.  She was advised to start AutoPap therapy.  Her set up date was 07/16/2021.  She has a ResMed air sense 11.   She had recently undergone a brain MRI without contrast, and I reviewed the results: IMPRESSION: 1. No evidence of acute infarct. 2. Thickening of the pituitary infundibulum. Recommend pituitary protocol MRI with contrast to further characterize and to exclude a mass. 3. Mild to  moderate chronic microvascular ischemic disease and mild atrophy. 4. Small right mastoid effusion.   Given the findings of the pituitary infundibulum she had a repeat brain MRI with and without contrast on 04/29/2021 and I reviewed the results:   IMPRESSION: 1. Abnormal thickening of the infundibulum, which appears intrinsically T1 hyperintense posteriorly. Additionally, a normal T1 bright spot in the posterior pituitary is not identified. These findings most likely are related to an ectopic posterior pituitary or a benign proteinaceous cyst (such as a Rathke's cleft cyst). Other etiologies such as an infundibular malignancy or cellular infiltrate are thought unlikely given the intrinsic T1 signal. A follow-up pituitary protocol MRI in approximately 6 months may be useful to ensure stability. Also, consider correlation with pituitary function labs. 2. No abnormal enhancement elsewhere.     I reviewed her AutoPap compliance data from 09/08/2021 through 10/08/2021, which is a total of 30 days, during which time she used her machine 29 days with percent used days greater than 4 hours at 90%, indicating excellent compliance with an average usage of 6 hours and 23 minutes, residual AHI at goal at 1.6/h, leak on the high side with a 95th percentile at 28.9 L/min, 95th percentile of press, range ofsure at 8.8 cm, range of 4-9 cm. We reduced her Maximum pressure in November to 9 cm from 10 cm.  She felt that she was waking up with headaches.  She reports that she does not sleep well with the AutoPap and struggles with it.  She cannot sleep on her back because of her scoliosis and when she sleeps on her side the mask dislodges and sometimes she just takes it off in the middle of the night. She is motivated to continue with treatment but is still struggling.  She takes melatonin every night.  She has had recurrent headaches, they are not always severe but she has taken Excedrin migraine or tension headaches.   She has not had an actual bout of vertigo but sometimes she feels that a vertigo attack may come on when she turns in bed.   The patient's allergies, current medications, family history, past medical history, past social history, past surgical history and problem list were reviewed and updated as appropriate.      Previously:    04/18/21: (She) reports a history of migrainous headaches for the past 2 to 3 years.  When she first started having migraines her severity was worse.  Lately, she has up to 3 or 4 migraine headaches per month.  She is trying to keep a log.  She describes a throbbing headache, it is primarily in the frontal areas bilaterally, denies any temporal tenderness or neck pain.  She has suffered from intermittent vertigo.  She has completed physical therapy for 4 weeks, without significant improvement.  Her vertigo attacks started back in April 2022 when she had another attack in May 2022.  Symptoms lasted for several days in May.  They were shorter in April.  She had  associated nausea and headaches and vertigo do not typically come together.  She has had intermittent tingling and numbness in her right hand but it seems to be independent of her headaches.  Excedrin Migraine helps her headaches, it does not last several hours or days.  In fact, after taking Excedrin and staying in bed she has no significant residual headache.  She has woken up with a headache typically, headaches are not strictly one-sided.  She had an eye examination last year and is due for October 2022 for her routine follow-up.  She does not have any new visual symptoms no sudden onset of one-sided weakness or numbness or tingling or droopy face or slurring of speech other than intermittent tingling and numbness in her right hand.  She has not fallen.  She limits her caffeine to 1 cup of coffee.  She drinks alcohol rarely.  She quit smoking some 36 years ago.  She lives alone, husband died some 8 years ago, her only son  lives with his family in [place].  She does not hydrate very well.  She estimates that she drinks about 3 to 4 cups of water per day.     Of note, she thinks she snores when she sleeps on her back.  She has nocturia about once or twice per average night.  She does not sleep well at night.  She takes melatonin.  She would be reluctant to come in for sleep study but would be willing to consider a home sleep test.   I reviewed your office note from 04/12/2021.  She has had several bouts of vertigo in the past several months.  She saw Dr. Melida Quitter in ENT in June 2022 and hearing testing as well as Marye Round testing was recommended.  She has had mild hearing loss.  She had further testing through audiology on 04/05/2021 and was found to have mild hearing loss on the right.  Dix-Hallpike was positive bilaterally.  She has had intermittent numbness in her hand.  You ordered a brain MRI.  She had a brain MRI without contrast at [facility] on 04/17/2021 and I was able to review some of the images through the PACS system but not all of them.  A formal report is pending.  I did not see any obvious abnormality.   She previously had physical therapy with vestibular rehab with modest results.  She was treated for COVID in May 2022 with Paxlovid.   She reports a family history of migraines affecting her mom and her sister.  She also reports stress, she helps take care of her 45 year old mother.   Her Past Medical History Is Significant For: Past Medical History:  Diagnosis Date   Arthritis    Family history of adverse reaction to anesthesia    post nausea and vomitting   GERD (gastroesophageal reflux disease)    hx of   H/O wheezing    occ.   Hand swelling    related to lymph nodes    Headache    migraines   History of hiatal hernia    Leg swelling    left leg -edema knee level to ankle.   Osteoporosis    Peripheral vascular disease (HCC)    left leg    PONV (postoperative nausea and  vomiting)    Scoliosis    Vertigo     Her Past Surgical History Is Significant For: Past Surgical History:  Procedure Laterality Date   ANAL RECTAL MANOMETRY N/A  08/03/2019   Procedure: ANO RECTAL MANOMETRY;  Surgeon: Leighton Ruff, MD;  Location: [facility];  Service: Endoscopy;  Laterality: N/A;   BACK SURGERY     scoliosis surgery 1970 and [place]   laparoscopic   COLONOSCOPY WITH PROPOFOL N/A 04/19/2015   Procedure: COLONOSCOPY WITH PROPOFOL;  Surgeon: Garlan Fair, MD;  Location: [facility];  Service: Endoscopy;  Laterality: N/A;   DEEP AXILLARY SENTINEL NODE BIOPSY / EXCISION     x2   ESOPHAGEAL MANOMETRY N/A 05/04/2015   Procedure: ESOPHAGEAL MANOMETRY (EM);  Surgeon: Garlan Fair, MD;  Location: [facility];  Service: Endoscopy;  Laterality: N/A;   ESOPHAGOGASTRODUODENOSCOPY (EGD) WITH PROPOFOL N/A 04/19/2015   Procedure: ESOPHAGOGASTRODUODENOSCOPY (EGD) WITH PROPOFOL;  Surgeon: Garlan Fair, MD;  Location: [facility];  Service: Endoscopy;  Laterality: N/A;   HEMORROIDECTOMY  2002, 2005   hiatal hernia surgery      JOINT REPLACEMENT     total right knee Dr. Theda Sers 02-26-18   LASER ABLATION  2008   left leg    left knee surgery - torn meniscus (left)     LIPOMA EXCISION N/A 09/21/2015   Procedure: EXCISION 6CM BACK LIPOMA;  Surgeon: Ralene Ok, MD;  Location: [facility];  Service: General;  Laterality: N/A;   MENISCUS REPAIR     right   REDUCTION MAMMAPLASTY Bilateral 1998   right shoulder rotator cuff surgery      ROBOTIC ASSISTED LAPAROSCOPIC SACROCOLPOPEXY N/A 05/30/2021   Procedure: XI ROBOTIC ASSISTED LAPAROSCOPIC SACROCOLPOPEXY;  Surgeon: Ardis Hughs, MD;  Location: [facility];  Service: Urology;  Laterality: N/A;   scoliosis  1970, 1972   no retained hardware   TOTAL KNEE ARTHROPLASTY Right 02/26/2018   Procedure: RIGHT TOTAL KNEE ARTHROPLASTY;  Surgeon: Sydnee Cabal, MD;  Location:  [facility];  Service: Orthopedics;  Laterality: Right;    Her Family History Is Significant For: Family History  Problem Relation Age of Onset   Varicose Veins Mother    Rheum arthritis Father    Cancer Sister    Heart disease Sister        before age 71   Hypertension Sister    Varicose Veins Sister    Heart attack Sister    Hyperlipidemia Brother    Rheum arthritis Sister    Allergies Sister    Breast cancer Neg Hx     Her Social History Is Significant For: Social History   Socioeconomic History   Marital status: Widowed    Spouse name: Not on file   Number of children: Not on file   Years of education: Not on file   Highest education level: Not on file  Occupational History   Occupation: Payroll Admin   Tobacco Use   Smoking status: Former    Packs/day: 1.00    Years: 5.00    Total pack years: 5.00    Types: Cigarettes    Quit date: 10/06/1978    Years since quitting: 43.6   Smokeless tobacco: Never  Vaping Use   Vaping Use: Never used  Substance and Sexual Activity   Alcohol use: Yes    Comment: occ beer or glass of wine   Drug use: No   Sexual activity: Yes  Other Topics Concern   Not on file  Social History Narrative   Lives at home alone   Right handed   Caffeine: 1-2 cups/day   Social Determinants of Health  Financial Resource Strain: Not on file  Food Insecurity: Not on file  Transportation Needs: Not on file  Physical Activity: Not on file  Stress: Not on file  Social Connections: Not on file    Her Allergies Are:  Allergies  Allergen Reactions   Zantac [Ranitidine Hcl] Anaphylaxis   Morphine And Related Nausea And Vomiting    Morphine, Hydrocodone Tolerates Dilaudid, Fentanyl per Epic   Prednisone Diarrhea and Nausea And Vomiting   Tramadol Nausea And Vomiting    AMS  :   Her Current Medications Are:  Outpatient Encounter Medications as of 05/14/2022  Medication Sig   amitriptyline (ELAVIL) 10 MG tablet Take 1 tablet (10 mg total) by  mouth at bedtime.   aspirin-acetaminophen-caffeine (EXCEDRIN MIGRAINE) 250-250-65 MG tablet Take 1 tablet by mouth as needed.   atorvastatin (LIPITOR) 10 MG tablet Take 10 mg by mouth in the morning.   denosumab (PROLIA) 60 MG/ML SOSY injection USE AS DIRECTED SUBCUTANEOUS EVERY 6 MONTHS AND 1 DAY   diclofenac sodium (VOLTAREN) 1 % GEL Apply 1 application topically 2 (two) times daily as needed (KNEE PAIN).   ibuprofen (ADVIL) 200 MG tablet Take 400 mg by mouth as needed.   melatonin 3 MG TABS tablet Take 3 mg by mouth at bedtime.   No facility-administered encounter medications on file as of 05/14/2022.  :  Review of Systems:  Out of a complete 14 point review of systems, all are reviewed and negative with the exception of these symptoms as listed below:  Review of Systems  Neurological:        Pt here stating cpap better. (Getting used to, has cats that get her to wake up).  Has seen ENT for positional vertigo, having issues on L side now.  Has pitutiary growth, MRI serially due January?  ESS 1.     Objective:  Neurological Exam  Physical Exam Physical Examination:   Vitals:   05/14/22 1039  BP: (!) 144/86  Pulse: 64    General Examination: The patient is a very pleasant 71 y.o. female in no acute distress. She appears well-developed and well-nourished and well groomed.   HEENT: Normocephalic, atraumatic, pupils are equal, round and reactive to light and accommodation.  Corrective eyeglasses in place. Extraocular tracking is good without limitation to gaze excursion or nystagmus noted. Normal smooth pursuit is noted. Hearing is grossly intact. Face is symmetric with normal facial animation. Speech is clear with no dysarthria noted. There is no hypophonia. There is no lip, neck/head, jaw or voice tremor. Neck is with FROM, no carotid bruits on auscultation. Oropharynx exam reveals: mild mouth dryness, adequate dental hygiene and mild airway crowding, tongue protrudes centrally and  palate elevates symmetrically.   Chest: Clear to auscultation without wheezing, rhonchi or crackles noted.   Heart: S1+S2+0, regular and normal without murmurs, rubs or gallops noted.    Abdomen: Soft, non-tender and non-distended.   Extremities: There is edema in the LEs bilaterally.     Skin: Warm and dry without trophic changes noted.    Musculoskeletal: exam reveals arthritic changes in both hands, right knee wider than left, status post right knee replacement.    Neurologically:  Mental status: The patient is awake, alert and oriented in all 4 spheres. Her immediate and remote memory, attention, language skills and fund of knowledge are appropriate. There is no evidence of aphasia, agnosia, apraxia or anomia. Speech is clear with normal prosody and enunciation. Thought process is linear. Mood is normal and affect  is normal.  Cranial nerves II - XII are as described above under HEENT exam.  Motor exam: Normal bulk, strength and tone is noted. There is no obvious tremor.  Fine motor skills and coordination: grossly intact.  Cerebellar testing: No dysmetria or intention tremor. There is no truncal or gait ataxia.  Sensory exam: intact to light touch.  Gait, station and balance: She stands easily. No veering to one side is noted. No leaning to one side is noted. Posture is mildly stooped, history of scoliosis.  She walks without a walking aid.    Assessment and Plan:  In summary, Valinda Fedie is a very pleasant 71 year old female with an underlying medical history of reflux disease, osteoporosis, peripheral vascular disease, peripheral edema, vitamin D deficiency, diverticulosis, arthritis with status post right knee replacement, scoliosis with status post surgery as a teenager, overweight state and recurrent vertigo, who presents for follow-up consultation of her recurrent headaches and sleep apnea.  She has adjusted to AutoPap therapy.  She was diagnosed with severe obstructive  sleep apnea based on a home sleep test in August 2022.  She has established AutoPap therapy since October 2022.  She reports improvement in her headaches, she is currently on amitriptyline since April 2023, low-dose at bedtime.  She tolerates the medication and I renewed the prescription for her.  She does have intermittent spells of vertigo which are short-lived and not as severe as they used to.  She has benefited from Epley maneuvers in the past.  She is advised to make a follow-up appointment with ENT.  She has also had physical therapy in the past.  She had a brain MRI in January 2023 without any evidence of pituitary adenoma.  We can repeat her brain MRI next year in about 6 months from now.  She is advised to continue to be consistent with her AutoPap therapy and try to hydrate well with water.  It may help with her vertigo and her headaches to be better hydrated.  She is advised to follow-up routinely in sleep clinic to see one of our nurse practitioners in 6 months, we can also order the brain MRI with and without contrast at the time to see if her findings are stable.  I answered all her questions today and she was in agreement. I spent 35 minutes in total face-to-face time and in reviewing records during pre-charting, more than 50% of which was spent in counseling and coordination of care, reviewing test results, reviewing medications and treatment regimen and/or in discussing or reviewing the diagnosis of OSA, recurrent HAs, the prognosis and treatment options. Pertinent laboratory and imaging test results that were available during this visit with the patient were reviewed by me and considered in my medical decision making (see chart for details).

## 2022-05-14 NOTE — Patient Instructions (Signed)
It was nice to see you again today.  Please continue with your AutoPap machine consistently.  Please try to hydrate better with water, drink about 6 to 8 cups of water per day on average.  We can consider ordering a repeat brain MRI early next year at your next follow-up to make sure findings are stable.  Please follow-up to see Meghan Avila in about 6 months.

## 2022-05-16 DIAGNOSIS — G4733 Obstructive sleep apnea (adult) (pediatric): Secondary | ICD-10-CM | POA: Diagnosis not present

## 2022-05-28 DIAGNOSIS — H6992 Unspecified Eustachian tube disorder, left ear: Secondary | ICD-10-CM | POA: Diagnosis not present

## 2022-06-02 DIAGNOSIS — N644 Mastodynia: Secondary | ICD-10-CM | POA: Diagnosis not present

## 2022-06-03 ENCOUNTER — Other Ambulatory Visit: Payer: Self-pay | Admitting: Family Medicine

## 2022-06-03 DIAGNOSIS — N644 Mastodynia: Secondary | ICD-10-CM

## 2022-06-13 ENCOUNTER — Other Ambulatory Visit: Payer: Self-pay | Admitting: Neurology

## 2022-06-16 DIAGNOSIS — G4733 Obstructive sleep apnea (adult) (pediatric): Secondary | ICD-10-CM | POA: Diagnosis not present

## 2022-06-19 IMAGING — MG MM DIGITAL DIAGNOSTIC UNILAT*R* W/ TOMO W/ CAD
4 series · 4 of 12 positions shown · non-contrast
Comparison: Previous exam(s).

CLINICAL DATA: Recall from screening mammography, possible
asymmetry in the inner subareolar RIGHT breast visible only on the
CC view. Personal history of reduction mammoplasty in 8225. Personal
history of benign RIGHT axillary node excision.

EXAM:
DIGITAL DIAGNOSTIC UNILATERAL RIGHT MAMMOGRAM WITH TOMOSYNTHESIS AND
CAD
TECHNIQUE: Right digital diagnostic mammography and breast tomosynthesis was
performed. The images were evaluated with computer-aided detection.

[R CC synth-2D]
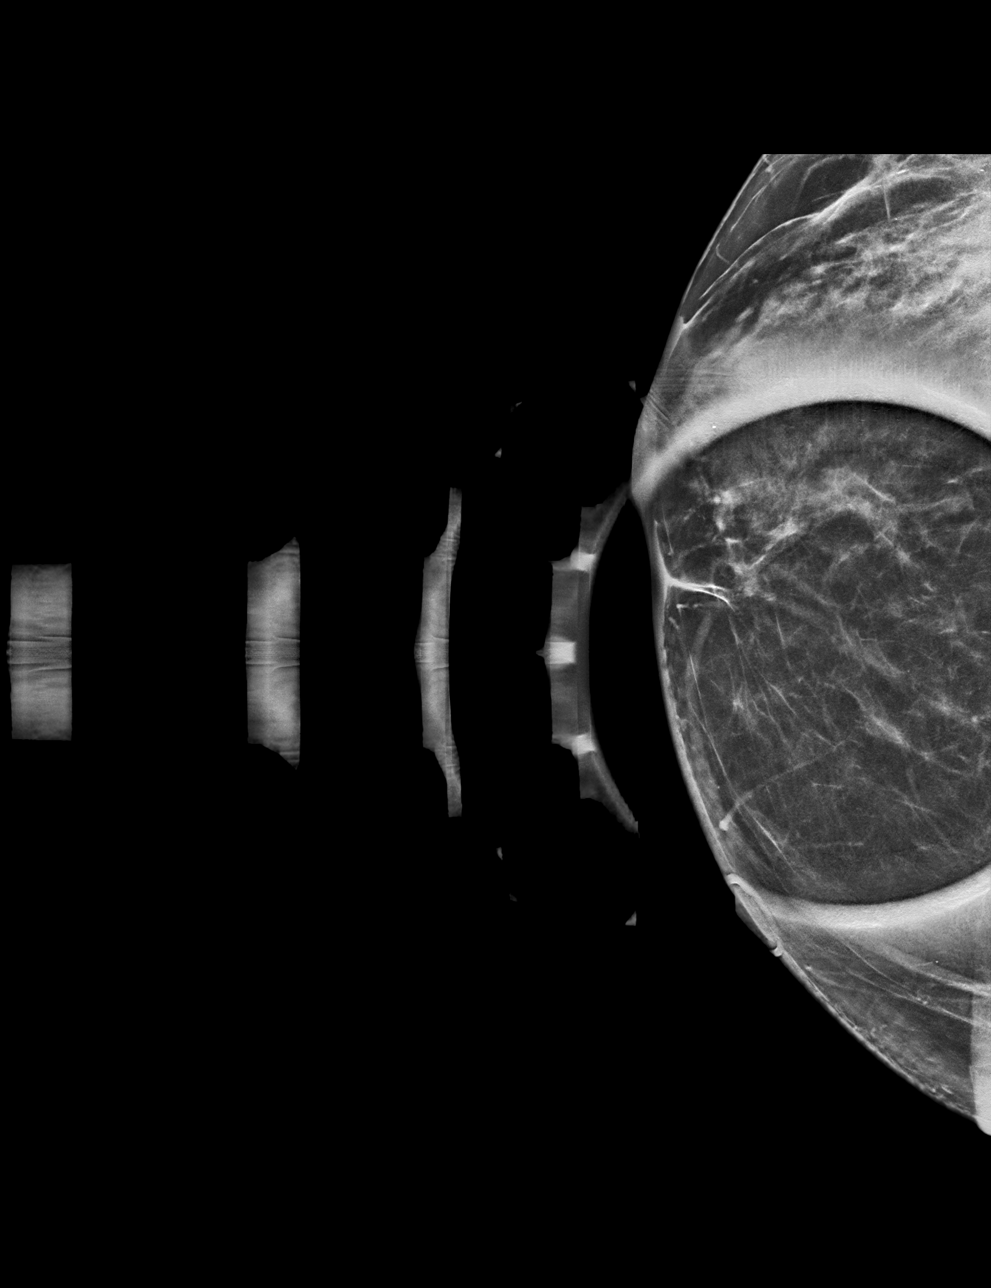

[R ML synth-2D]
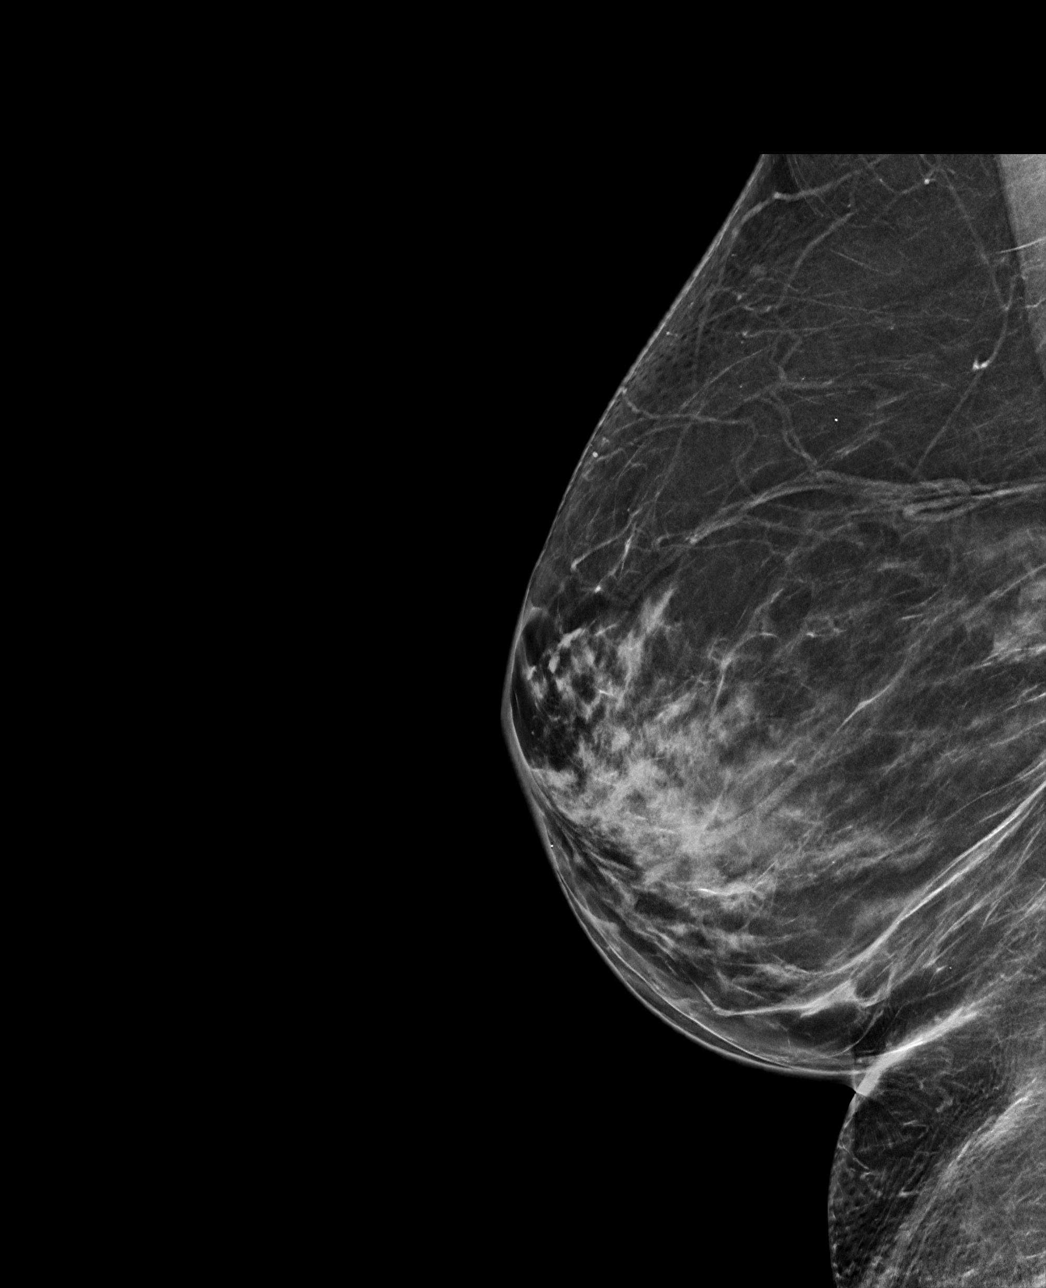

[R ML tomo · tomo slice 37/74.0]
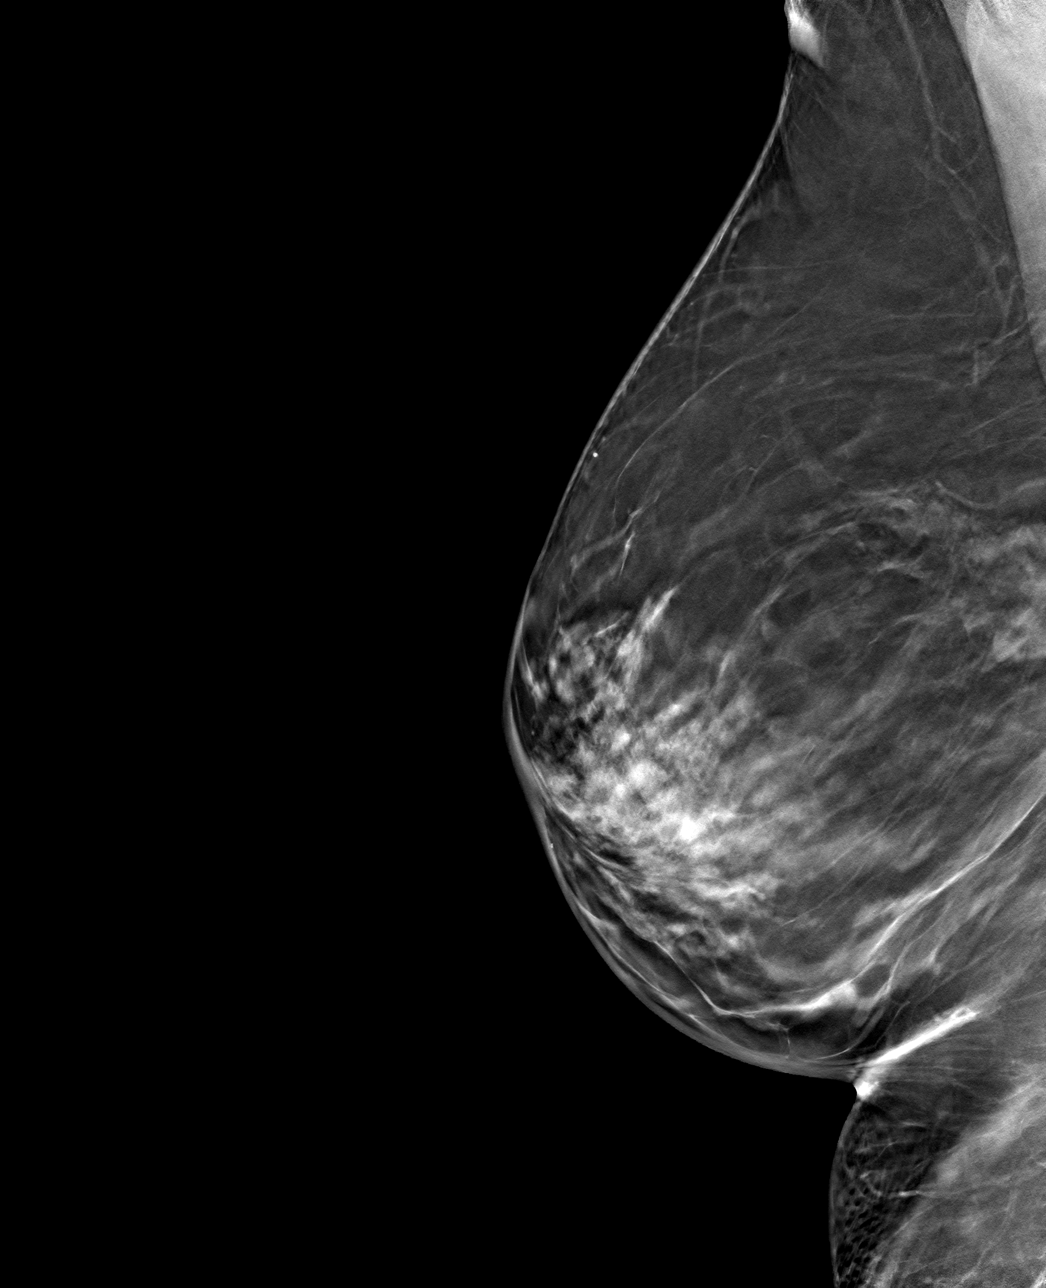

[R CC tomo · tomo slice 30/59.0]
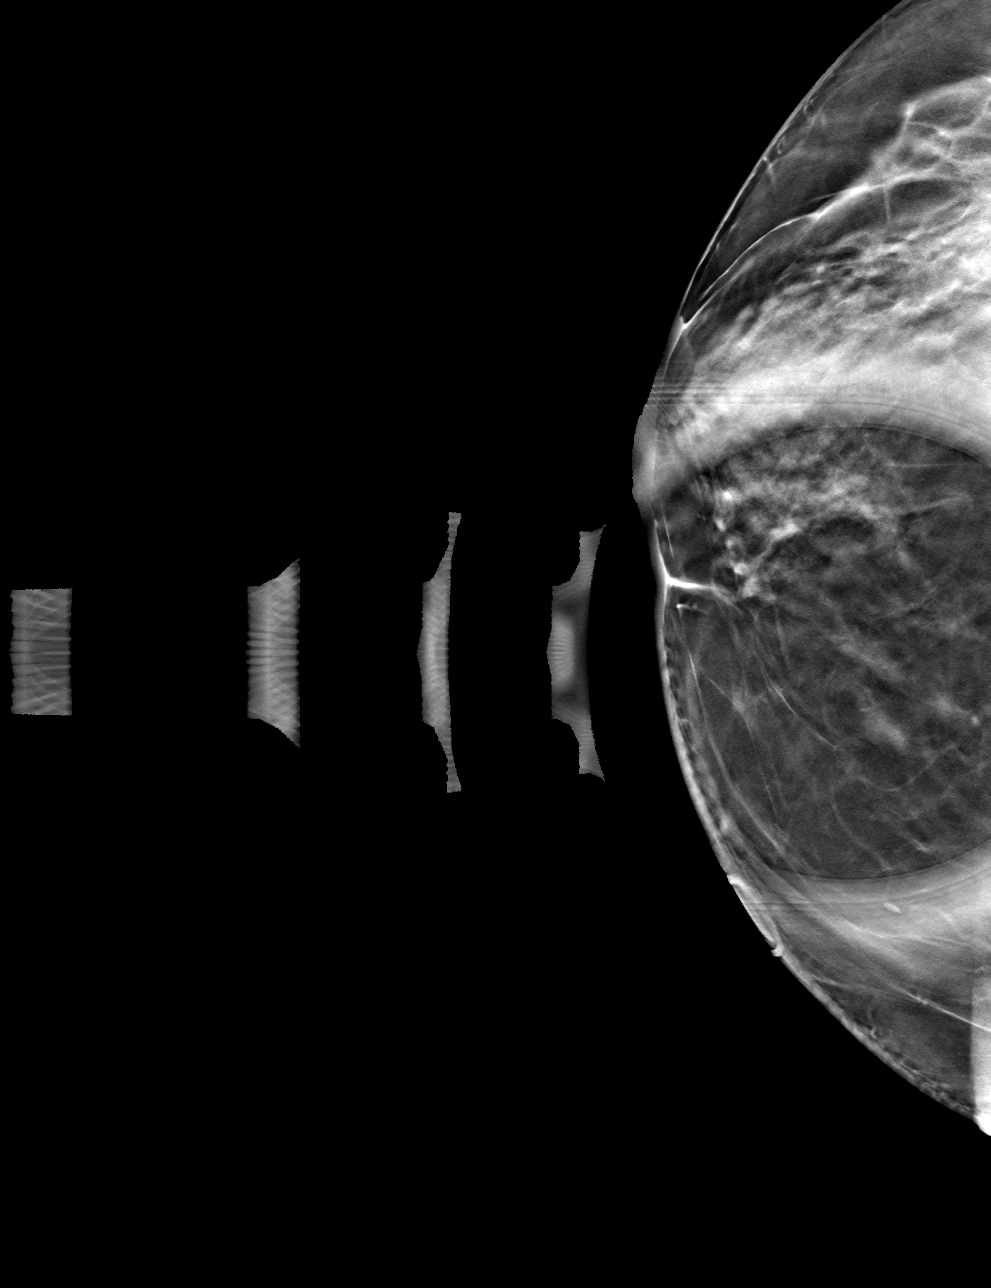

[4 of 12 positions shown; findings below may reference images not displayed]

ACR Breast Density Category b: There are scattered areas of
fibroglandular density.
FINDINGS: Spot-compression CC view of the area of concern and a full field
mediolateral view were obtained.

The asymmetry in the inner subareolar location questioned on
screening mammography disperses with compression and likely
represents overlapping fibroglandular tissue and normal ducts. There
is no underlying mass.

No findings suspicious for malignancy.
IMPRESSION: No mammographic evidence of malignancy involving the RIGHT breast.

RECOMMENDATION:
Screening mammogram in one year.(Code:[id number]-[id number])

I have discussed the findings and recommendations with the patient.
If applicable, a reminder letter will be sent to the patient
regarding the next appointment.

BI-RADS CATEGORY  1: Negative.

## 2022-06-20 ENCOUNTER — Other Ambulatory Visit: Payer: PPO

## 2022-06-24 ENCOUNTER — Other Ambulatory Visit: Payer: PPO

## 2022-06-27 DIAGNOSIS — Z0000 Encounter for general adult medical examination without abnormal findings: Secondary | ICD-10-CM | POA: Diagnosis not present

## 2022-06-27 DIAGNOSIS — Z1389 Encounter for screening for other disorder: Secondary | ICD-10-CM | POA: Diagnosis not present

## 2022-06-27 DIAGNOSIS — Z6828 Body mass index (BMI) 28.0-28.9, adult: Secondary | ICD-10-CM | POA: Diagnosis not present

## 2022-07-01 ENCOUNTER — Ambulatory Visit
Admission: RE | Admit: 2022-07-01 | Discharge: 2022-07-01 | Disposition: A | Discharge status: Home / Self Care | Payer: PPO | Source: Ambulatory Visit | Attending: Family Medicine | Admitting: Family Medicine

## 2022-07-01 DIAGNOSIS — N644 Mastodynia: Secondary | ICD-10-CM | POA: Diagnosis not present

## 2022-07-02 DIAGNOSIS — Z79899 Other long term (current) drug therapy: Secondary | ICD-10-CM | POA: Diagnosis not present

## 2022-07-02 DIAGNOSIS — Z23 Encounter for immunization: Secondary | ICD-10-CM | POA: Diagnosis not present

## 2022-07-02 DIAGNOSIS — M818 Other osteoporosis without current pathological fracture: Secondary | ICD-10-CM | POA: Diagnosis not present

## 2022-07-02 DIAGNOSIS — E663 Overweight: Secondary | ICD-10-CM | POA: Diagnosis not present

## 2022-07-02 DIAGNOSIS — Z6828 Body mass index (BMI) 28.0-28.9, adult: Secondary | ICD-10-CM | POA: Diagnosis not present

## 2022-07-02 DIAGNOSIS — I2510 Atherosclerotic heart disease of native coronary artery without angina pectoris: Secondary | ICD-10-CM | POA: Diagnosis not present

## 2022-07-16 DIAGNOSIS — H02834 Dermatochalasis of left upper eyelid: Secondary | ICD-10-CM | POA: Diagnosis not present

## 2022-07-16 DIAGNOSIS — H43812 Vitreous degeneration, left eye: Secondary | ICD-10-CM | POA: Diagnosis not present

## 2022-07-16 DIAGNOSIS — H2513 Age-related nuclear cataract, bilateral: Secondary | ICD-10-CM | POA: Diagnosis not present

## 2022-07-16 DIAGNOSIS — H40013 Open angle with borderline findings, low risk, bilateral: Secondary | ICD-10-CM | POA: Diagnosis not present

## 2022-07-16 DIAGNOSIS — G4733 Obstructive sleep apnea (adult) (pediatric): Secondary | ICD-10-CM | POA: Diagnosis not present

## 2022-07-31 ENCOUNTER — Other Ambulatory Visit: Payer: Self-pay | Admitting: Family Medicine

## 2022-07-31 DIAGNOSIS — Z1231 Encounter for screening mammogram for malignant neoplasm of breast: Secondary | ICD-10-CM

## 2022-09-25 ENCOUNTER — Ambulatory Visit
Admission: RE | Admit: 2022-09-25 | Discharge: 2022-09-25 | Disposition: A | Discharge status: Home / Self Care | Payer: PPO | Source: Ambulatory Visit | Attending: Family Medicine | Admitting: Family Medicine

## 2022-09-25 DIAGNOSIS — Z1231 Encounter for screening mammogram for malignant neoplasm of breast: Secondary | ICD-10-CM | POA: Diagnosis not present

## 2022-11-11 ENCOUNTER — Telehealth: Payer: Self-pay | Admitting: Neurology

## 2022-11-11 DIAGNOSIS — E237 Disorder of pituitary gland, unspecified: Secondary | ICD-10-CM

## 2022-11-11 NOTE — Telephone Encounter (Signed)
Spoke with patient and let her know the order for MRI has been placed with and without contrast. CMP needed beforehand to check kidney and liver function before contrast administered. Pt said she could come here and will try to come during lab hours this week.   Patient requested [facility] for MRI location.  CMP order placed.

## 2022-11-11 NOTE — Telephone Encounter (Signed)
Pt is calling. Stated Dr. Rexene Alberts told her to call and remind her to order a MRI.

## 2022-11-11 NOTE — Telephone Encounter (Signed)
[facility] adv NPR sent to [phone]

## 2022-11-11 NOTE — Telephone Encounter (Signed)
MRI sent to [facility]

## 2022-11-11 NOTE — Addendum Note (Signed)
Addended by: Gildardo Griffes on: 11/11/2022 11:01 AM   Modules accepted: Orders

## 2022-11-11 NOTE — Telephone Encounter (Signed)
I have ordered a repeat brain MRI with and without contrast. Since there is no recent chemistry panel in her chart or viewable in care everywhere, she may need blood work to make sure her kidney function and liver function are okay before proceeding with a contrasted MRI.  Please notify patient that she can have her chemistry panel called CMP or CHEM 10 drawn through primary care so long as we get the results faxed to [place] or she can come to our office for a blood draw, in which case please put the lab test in.

## 2022-11-12 ENCOUNTER — Other Ambulatory Visit (INDEPENDENT_AMBULATORY_CARE_PROVIDER_SITE_OTHER): Payer: Self-pay

## 2022-11-12 DIAGNOSIS — Z0289 Encounter for other administrative examinations: Secondary | ICD-10-CM

## 2022-11-12 DIAGNOSIS — E237 Disorder of pituitary gland, unspecified: Secondary | ICD-10-CM | POA: Diagnosis not present

## 2022-11-13 LAB — COMPREHENSIVE METABOLIC PANEL
ALT: 20 IU/L (ref 0–32)
AST: 19 IU/L (ref 0–40)
Albumin/Globulin Ratio: 2.8 — ABNORMAL HIGH (ref 1.2–2.2)
Albumin: 4.4 g/dL (ref 3.8–4.8)
Alkaline Phosphatase: 74 IU/L (ref 44–121)
BUN/Creatinine Ratio: 24 (ref 12–28)
BUN: 17 mg/dL (ref 8–27)
Bilirubin Total: 0.2 mg/dL (ref 0.0–1.2)
CO2: 25 mmol/L (ref 20–29)
Calcium: 8.9 mg/dL (ref 8.7–10.3)
Chloride: 106 mmol/L (ref 96–106)
Creatinine, Ser: 0.7 mg/dL (ref 0.57–1.00)
Globulin, Total: 1.6 g/dL (ref 1.5–4.5)
Glucose: 92 mg/dL (ref 70–99)
Potassium: 4.7 mmol/L (ref 3.5–5.2)
Sodium: 144 mmol/L (ref 134–144)
Total Protein: 6 g/dL (ref 6.0–8.5)
eGFR: 92 mL/min/{1.73_m2} (ref >59)

## 2022-11-14 DIAGNOSIS — M810 Age-related osteoporosis without current pathological fracture: Secondary | ICD-10-CM | POA: Diagnosis not present

## 2022-11-18 ENCOUNTER — Ambulatory Visit: Payer: PPO | Admitting: Adult Health

## 2022-12-03 ENCOUNTER — Ambulatory Visit (HOSPITAL_COMMUNITY)
Admission: RE | Admit: 2022-12-03 | Discharge: 2022-12-03 | Disposition: A | Discharge status: Home / Self Care | Payer: PPO | Source: Ambulatory Visit | Attending: Neurology | Admitting: Neurology

## 2022-12-03 DIAGNOSIS — E237 Disorder of pituitary gland, unspecified: Secondary | ICD-10-CM | POA: Diagnosis not present

## 2022-12-03 DIAGNOSIS — G9389 Other specified disorders of brain: Secondary | ICD-10-CM | POA: Diagnosis not present

## 2022-12-03 MED ORDER — GADOBUTROL 1 MMOL/ML IV SOLN
7.0000 mL | Freq: Once | INTRAVENOUS | Status: AC | PRN
  Administered 2022-12-03: 7 mL via INTRAVENOUS

## 2022-12-08 ENCOUNTER — Telehealth: Payer: Self-pay | Admitting: *Deleted

## 2022-12-08 NOTE — Telephone Encounter (Signed)
-----   Message from Star Age, MD sent at 12/04/2022 11:18 AM EST ----- Please call patient and advise her that her brain MRI showed stable findings compared to January 2023.  I recommend that she follow-up with primary care to see if she has any abnormal findings on thyroid function or growth hormone check, she may benefit from seeing an endocrinologist if she has never seen one. Sometimes a pituitary abnormality or pituitary adenoma which is a benign growth of the pituitary gland can produce extra hormones.

## 2022-12-08 NOTE — Telephone Encounter (Signed)
Spoke with patient and discussed results of MRI as noted below by Dr Rexene Alberts. Patient will follow up with PCP for labs on thyroid function or growth hormone check. Pt also aware she may benefit from seeing endocrinology. She has a f/u with Dr Rexene Alberts on 3/27 at 745 am.

## 2022-12-22 DIAGNOSIS — R9089 Other abnormal findings on diagnostic imaging of central nervous system: Secondary | ICD-10-CM | POA: Diagnosis not present

## 2022-12-22 DIAGNOSIS — I2510 Atherosclerotic heart disease of native coronary artery without angina pectoris: Secondary | ICD-10-CM | POA: Diagnosis not present

## 2022-12-22 DIAGNOSIS — E559 Vitamin D deficiency, unspecified: Secondary | ICD-10-CM | POA: Diagnosis not present

## 2022-12-22 DIAGNOSIS — D649 Anemia, unspecified: Secondary | ICD-10-CM | POA: Diagnosis not present

## 2022-12-22 DIAGNOSIS — Z6829 Body mass index (BMI) 29.0-29.9, adult: Secondary | ICD-10-CM | POA: Diagnosis not present

## 2022-12-22 DIAGNOSIS — M818 Other osteoporosis without current pathological fracture: Secondary | ICD-10-CM | POA: Diagnosis not present

## 2022-12-31 ENCOUNTER — Ambulatory Visit: Payer: PPO | Admitting: Neurology

## 2022-12-31 ENCOUNTER — Encounter: Payer: Self-pay | Admitting: Neurology

## 2022-12-31 VITALS — BP 108/64 | HR 77 | Ht 60.0 in | Wt 144.2 lb

## 2022-12-31 DIAGNOSIS — E237 Disorder of pituitary gland, unspecified: Secondary | ICD-10-CM | POA: Diagnosis not present

## 2022-12-31 DIAGNOSIS — G4733 Obstructive sleep apnea (adult) (pediatric): Secondary | ICD-10-CM | POA: Diagnosis not present

## 2022-12-31 NOTE — Patient Instructions (Signed)
Please get back on your CPAP machine, I have written for new supplies through your DME provider.  As you know, you have severe sleep apnea and we do not recommend leaving it untreated for fear of long-term health consequences such as development of heart disease and increased risk for stroke or mini stroke.  Please try to hydrate better with water, drink about 6 to 8 cups of water per day, 8 ounce size each.  Try to make more time for sleep, 7 to 8 hours are recommended, generally speaking.  You do not have to restart the amitriptyline as you have been off of it, it may be more useful for your headaches to be consistent with your CPAP machine.  Please check with your primary care physician, if she wants you to see an endocrinologist for your pituitary abnormality.  Follow-up to see the nurse practitioner in this clinic in about 6 months.

## 2022-12-31 NOTE — Progress Notes (Signed)
Subjective:    Patient ID: Meghan Avila is a 72 y.o. female.  HPI    Interim history:   Meghan Avila is a 72 year old right-handed woman with an underlying medical history of reflux disease, osteoporosis, peripheral vascular disease, peripheral edema, vitamin D deficiency, diverticulosis, arthritis with status post right knee replacement, scoliosis (s/p rod placement at age 25, s/p rod removal at age 42), borderline obesity, and vertigo, who presents for follow-up consultation of her migraine and sleep apnea, on autoPAP therapy. The patient is unaccompanied today. I last saw her on 05/14/2022 , at which time she reported doing a little better with her CPAP machine, she has adjusted better.  She reported stress.  She reported intermittent vertigo symptoms for which she had been to PT. She was encouraged to drink more water.   She had an interim repeat brain MRI with and without contrast on 12/03/2022 and I reviewed the results: IMPRESSION: Unchanged intrinsically T1 hyperintense lesion at the base of the infundibulum measuring 5 x 3 x 4 mm. If the patient has clinical signs of growth hormone deficiency this could represent an ectopic pituitary. However in the absence of endocrine dysfunction pituitary adenoma remains a differential consideration. She was notified by phone call and encouraged to see her PCP and potentially also endocrinology.  Today, 12/31/2022: I reviewed her AutoPap compliance data from the past year.  She essentially stopped using her CPAP completely in December 2023.  She had sporadic years starting in September 2023.  She reports that in December her Meghan Avila moved in with her.  She has disrupted sleep as she has to assist her Meghan Avila even overnight.  Meghan Avila is 72 years old.  Patient gets about 4 hours of sleep on an average night she reports.  She endorses stress.  She does not hydrate quite as well but does report that she has done a little better with water intake.  She no longer  takes the amitriptyline.  She has occasional headaches, including morning headaches.  She had a checkup with primary care and had blood work done.  She has not been referred to an endocrinologist but will double check with primary care.  She is interested in getting back on her AutoPap machine.  She will need new supplies.  She had updated eye exam in October 2023 and received new eyeglasses.   The patient's allergies, current medications, family history, past medical history, past social history, past surgical history and problem list were reviewed and updated as appropriate.    Previously:  I saw her on 10/09/21, at which time she reported struggling with AutoPap therapy, she had difficulty keeping the mask on.  We had changed her pressure to help with tolerance.  She was waking up with headaches.  I suggested she start gabapentin.    She saw Frann Rider, NP in the interim on 01/08/2022, at which time she was good with compliance, she reported that she had not started the gabapentin due to concern for side effects.  She had a recent episode of vertigo.  She was advised to start amitriptyline at night for migraine prevention.    I reviewed her AutoPap compliance data from 04/02/2022 through 05/01/2022, which is a total of 30 days, during which time she used her machine only 19 days with percent usage days greater than 4 hours at 50%, indicating suboptimal compliance, average usage of 5 hours and 45 minutes, residual AHI: 0.5/h, 95th percentile pressure at 8.8 cm with a range of 4 to  9 cm, leak on the higher side with a 95th percentile at 27.5 L/min.  In the past 90 days, she has had a similar compliance, no usage after 05/01/2022.      I first met her at the request of her primary care physician on 04/18/2021, at which time she reported a 2 to 3-year history of migrainous headaches.  He also reported intermittent vertigo attacks since April 2022.  She had worked with physical therapy and had seen ENT.  I  suggested we proceed with a sleep study as she reported snoring and morning headaches as well as not sleeping well at night.  She had a home sleep test on 05/22/2021, which indicated severe obstructive sleep apnea with an AHI of 38.9/h, O2 nadir 77%.  She was advised to start AutoPap therapy.  Her set up date was 07/16/2021.  She has a ResMed air sense 11.   She had recently undergone a brain MRI without contrast, and I reviewed the results: IMPRESSION: 1. No evidence of acute infarct. 2. Thickening of the pituitary infundibulum. Recommend pituitary protocol MRI with contrast to further characterize and to exclude a mass. 3. Mild to moderate chronic microvascular ischemic disease and mild atrophy. 4. Small right mastoid effusion.   Given the findings of the pituitary infundibulum she had a repeat brain MRI with and without contrast on 04/29/2021 and I reviewed the results:   IMPRESSION: 1. Abnormal thickening of the infundibulum, which appears intrinsically T1 hyperintense posteriorly. Additionally, a normal T1 bright spot in the posterior pituitary is not identified. These findings most likely are related to an ectopic posterior pituitary or a benign proteinaceous cyst (such as a Rathke's cleft cyst). Other etiologies such as an infundibular malignancy or cellular infiltrate are thought unlikely given the intrinsic T1 signal. A follow-up pituitary protocol MRI in approximately 6 months may be useful to ensure stability. Also, consider correlation with pituitary function labs. 2. No abnormal enhancement elsewhere.     I reviewed her AutoPap compliance data from 09/08/2021 through 10/08/2021, which is a total of 30 days, during which time she used her machine 29 days with percent used days greater than 4 hours at 90%, indicating excellent compliance with an average usage of 6 hours and 23 minutes, residual AHI at goal at 1.6/h, leak on the high side with a 95th percentile at 28.9 L/min, 95th  percentile of press, range ofsure at 8.8 cm, range of 4-9 cm. We reduced her Maximum pressure in November to 9 cm from 10 cm.  She felt that she was waking up with headaches.  She reports that she does not sleep well with the AutoPap and struggles with it.  She cannot sleep on her back because of her scoliosis and when she sleeps on her side the mask dislodges and sometimes she just takes it off in the middle of the night. She is motivated to continue with treatment but is still struggling.  She takes melatonin every night.  She has had recurrent headaches, they are not always severe but she has taken Excedrin migraine or tension headaches.  She has not had an actual bout of vertigo but sometimes she feels that a vertigo attack may come on when she turns in bed.   The patient's allergies, current medications, family history, past medical history, past social history, past surgical history and problem list were reviewed and updated as appropriate.      Previously:    04/18/21: (She) reports a history of migrainous headaches for  the past 2 to 3 years.  When she first started having migraines her severity was worse.  Lately, she has up to 3 or 4 migraine headaches per month.  She is trying to keep a log.  She describes a throbbing headache, it is primarily in the frontal areas bilaterally, denies any temporal tenderness or neck pain.  She has suffered from intermittent vertigo.  She has completed physical therapy for 4 weeks, without significant improvement.  Her vertigo attacks started back in April 2022 when she had another attack in May 2022.  Symptoms lasted for several days in May.  They were shorter in April.  She had associated nausea and headaches and vertigo do not typically come together.  She has had intermittent tingling and numbness in her right hand but it seems to be independent of her headaches.  Excedrin Migraine helps her headaches, it does not last several hours or days.  In fact, after taking  Excedrin and staying in bed she has no significant residual headache.  She has woken up with a headache typically, headaches are not strictly one-sided.  She had an eye examination last year and is due for October 2022 for her routine follow-up.  She does not have any new visual symptoms no sudden onset of one-sided weakness or numbness or tingling or droopy face or slurring of speech other than intermittent tingling and numbness in her right hand.  She has not fallen.  She limits her caffeine to 1 cup of coffee.  She drinks alcohol rarely.  She quit smoking some 36 years ago.  She lives alone, husband died some 8 years ago, her only son lives with his family in [place].  She does not hydrate very well.  She estimates that she drinks about 3 to 4 cups of water per day.     Of note, she thinks she snores when she sleeps on her back.  She has nocturia about once or twice per average night.  She does not sleep well at night.  She takes melatonin.  She would be reluctant to come in for sleep study but would be willing to consider a home sleep test.   I reviewed your office note from 04/12/2021.  She has had several bouts of vertigo in the past several months.  She saw Dr. Melida Quitter in ENT in June 2022 and hearing testing as well as Marye Round testing was recommended.  She has had mild hearing loss.  She had further testing through audiology on 04/05/2021 and was found to have mild hearing loss on the right.  Dix-Hallpike was positive bilaterally.  She has had intermittent numbness in her hand.  You ordered a brain MRI.  She had a brain MRI without contrast at [facility] on 04/17/2021 and I was able to review some of the images through the PACS system but not all of them.  A formal report is pending.  I did not see any obvious abnormality.   She previously had physical therapy with vestibular rehab with modest results.  She was treated for COVID in May 2022 with Paxlovid.   She reports a family history  of migraines affecting her Meghan Avila and her sister.  She also reports stress, she helps take care of her 44 year old mother.      Her Past Medical History Is Significant For: Past Medical History:  Diagnosis Date   Arthritis    Family history of adverse reaction to anesthesia    post nausea and  vomitting   GERD (gastroesophageal reflux disease)    hx of   H/O wheezing    occ.   Hand swelling    related to lymph nodes    Headache    migraines   History of hiatal hernia    Leg swelling    left leg -edema knee level to ankle.   Osteoporosis    Peripheral vascular disease (HCC)    left leg    PONV (postoperative nausea and vomiting)    Scoliosis    Vertigo     Her Past Surgical History Is Significant For: Past Surgical History:  Procedure Laterality Date   ANAL RECTAL MANOMETRY N/A 08/03/2019   Procedure: ANO RECTAL MANOMETRY;  Surgeon: Leighton Ruff, MD;  Location: [facility];  Service: Endoscopy;  Laterality: N/A;   BACK SURGERY     scoliosis surgery 1970 and [place]   laparoscopic   COLONOSCOPY WITH PROPOFOL N/A 04/19/2015   Procedure: COLONOSCOPY WITH PROPOFOL;  Surgeon: Garlan Fair, MD;  Location: [facility];  Service: Endoscopy;  Laterality: N/A;   DEEP AXILLARY SENTINEL NODE BIOPSY / EXCISION     x2   ESOPHAGEAL MANOMETRY N/A 05/04/2015   Procedure: ESOPHAGEAL MANOMETRY (EM);  Surgeon: Garlan Fair, MD;  Location: [facility];  Service: Endoscopy;  Laterality: N/A;   ESOPHAGOGASTRODUODENOSCOPY (EGD) WITH PROPOFOL N/A 04/19/2015   Procedure: ESOPHAGOGASTRODUODENOSCOPY (EGD) WITH PROPOFOL;  Surgeon: Garlan Fair, MD;  Location: [facility];  Service: Endoscopy;  Laterality: N/A;   HEMORROIDECTOMY  2002, 2005   hiatal hernia surgery      JOINT REPLACEMENT     total right knee Dr. Theda Sers 02-26-18   LASER ABLATION  2008   left leg    left knee surgery - torn meniscus (left)     LIPOMA EXCISION  N/A 09/21/2015   Procedure: EXCISION 6CM BACK LIPOMA;  Surgeon: Ralene Ok, MD;  Location: [facility];  Service: General;  Laterality: N/A;   MENISCUS REPAIR     right   REDUCTION MAMMAPLASTY Bilateral 1998   right shoulder rotator cuff surgery      ROBOTIC ASSISTED LAPAROSCOPIC SACROCOLPOPEXY N/A 05/30/2021   Procedure: XI ROBOTIC ASSISTED LAPAROSCOPIC SACROCOLPOPEXY;  Surgeon: Ardis Hughs, MD;  Location: [facility];  Service: Urology;  Laterality: N/A;   scoliosis  1970, 1972   no retained hardware   TOTAL KNEE ARTHROPLASTY Right 02/26/2018   Procedure: RIGHT TOTAL KNEE ARTHROPLASTY;  Surgeon: Sydnee Cabal, MD;  Location: [facility];  Service: Orthopedics;  Laterality: Right;    Her Family History Is Significant For: Family History  Problem Relation Age of Onset   Varicose Veins Mother    Rheum arthritis Father    Cancer Sister    Heart disease Sister        before age 39   Hypertension Sister    Varicose Veins Sister    Heart attack Sister    Rheum arthritis Sister    Allergies Sister    Hyperlipidemia Brother    Sleep apnea Brother    Breast cancer Neg Hx     Her Social History Is Significant For: Social History   Socioeconomic History   Marital status: Widowed    Spouse name: Not on file   Number of children: Not on file   Years of education: Not on file   Highest education level: Not on file  Occupational History   Occupation: Payroll Admin  Tobacco Use   Smoking status: Former    Packs/day: 1.00    Years: 5.00    Additional pack years: 0.00    Total pack years: 5.00    Types: Cigarettes    Quit date: 10/06/1978    Years since quitting: 44.2   Smokeless tobacco: Never  Vaping Use   Vaping Use: Never used  Substance and Sexual Activity   Alcohol use: Yes    Comment: occ beer or glass of wine   Drug use: No   Sexual activity: Yes  Other Topics Concern   Not on file  Social History Narrative   Lives at home alone   Right handed   Caffeine: 1-2  cups/day   Social Determinants of Health   Financial Resource Strain: Not on file  Food Insecurity: Not on file  Transportation Needs: Not on file  Physical Activity: Not on file  Stress: Not on file  Social Connections: Not on file    Her Allergies Are:  Allergies  Allergen Reactions   Zantac [Ranitidine Hcl] Anaphylaxis   Morphine And Related Nausea And Vomiting    Morphine, Hydrocodone Tolerates Dilaudid, Fentanyl per Epic   Prednisone Diarrhea and Nausea And Vomiting   Tramadol Nausea And Vomiting    AMS  :   Her Current Medications Are:  Outpatient Encounter Medications as of 12/31/2022  Medication Sig   aspirin-acetaminophen-caffeine (EXCEDRIN MIGRAINE) 250-250-65 MG tablet Take 1 tablet by mouth as needed.   atorvastatin (LIPITOR) 10 MG tablet Take 10 mg by mouth in the morning.   denosumab (PROLIA) 60 MG/ML SOSY injection USE AS DIRECTED SUBCUTANEOUS EVERY 6 MONTHS AND 1 DAY   diclofenac sodium (VOLTAREN) 1 % GEL Apply 1 application topically 2 (two) times daily as needed (KNEE PAIN).   melatonin 3 MG TABS tablet Take 3 mg by mouth at bedtime.   amitriptyline (ELAVIL) 10 MG tablet TAKE 1 TABLET BY MOUTH EVERYDAY AT BEDTIME (Patient not taking: Reported on 12/31/2022)   ibuprofen (ADVIL) 200 MG tablet Take 400 mg by mouth as needed.   No facility-administered encounter medications on file as of 12/31/2022.  :  Review of Systems:  Out of a complete 14 point review of systems, all are reviewed and negative with the exception of these symptoms as listed below:  Review of Systems  Neurological:        Pt here to review MRI results Pt states hasn't used CPAP in 6 months or more. Pt states taking care of her mother and up 2 to 3 times nightly helping her mother   ESS:0    Objective:  Neurological Exam  Physical Exam Physical Examination:   Vitals:   12/31/22 0739  BP: 108/64  Pulse: 77    General Examination: The patient is a very pleasant 72 y.o. female in  no acute distress. She appears well-developed and well-nourished and well groomed.   HEENT: Normocephalic, atraumatic, pupils are equal, round and reactive to light and accommodation.  Corrective eyeglasses in place. Extraocular tracking is good without limitation to gaze excursion or nystagmus noted. Normal smooth pursuit is noted. Hearing is grossly intact. Face is symmetric with normal facial animation. Speech is clear with no dysarthria noted. There is no hypophonia. There is no lip, neck/head, jaw or voice tremor. Neck is with FROM, no carotid bruits on auscultation. Oropharynx exam reveals: mild to moderate mouth dryness, adequate dental hygiene and mild airway crowding, tongue protrudes centrally and palate elevates symmetrically.   Chest: Clear to auscultation  without wheezing, rhonchi or crackles noted.   Heart: S1+S2+0, regular and normal without murmurs, rubs or gallops noted.    Abdomen: Soft, non-tender and non-distended.   Extremities: There is edema in the LEs bilaterally, appears stable.     Skin: Warm and dry without trophic changes noted.    Musculoskeletal: exam reveals arthritic changes in both hands, right knee wider than left, status post right knee replacement.    Neurologically:  Mental status: The patient is awake, alert and oriented in all 4 spheres. Her immediate and remote memory, attention, language skills and fund of knowledge are appropriate. There is no evidence of aphasia, agnosia, apraxia or anomia. Speech is clear with normal prosody and enunciation. Thought process is linear. Mood is normal and affect is normal.  Cranial nerves II - XII are as described above under HEENT exam.  Motor exam: Normal bulk, strength and tone is noted. There is no obvious tremor.  Fine motor skills and coordination: grossly intact.  Cerebellar testing: No dysmetria or intention tremor. There is no truncal or gait ataxia.  Sensory exam: intact to light touch.  Gait, station and  balance: She stands easily. No veering to one side is noted. No leaning to one side is noted. Posture is mildly stooped, she has a history of scoliosis.  She walks without a walking aid.     Assessment and Plan:  In summary, Georganna Mcclave is a very pleasant 72 year old female with an underlying medical history of reflux disease, osteoporosis, peripheral vascular disease, peripheral edema, vitamin D deficiency, diverticulosis, arthritis with status post right knee replacement, scoliosis with status post surgery as a teenager, pituitary abnormality, overweight state and recurrent vertigo, who presents for follow-up consultation of her recurrent headaches and sleep apnea.  She has not use her AutoPap essentially since December 2023.  She stopped taking her amitriptyline.  We talked about the importance of treating severe sleep apnea.  Of note, her home sleep test from August 2022 showed severe obstructive sleep apnea with an AHI of 38.9/h, O2 nadir 77%.  She is reminded to stay better hydrated, treating her sleep apnea and better hydration may very well improve her headaches.  She has stopped taking the amitriptyline and has not had any major repercussions after stopping it.  She can stay off of it for now.  She is advised to check in with primary care as to her blood work recently and whether she is supposed to see an endocrinologist.  Brain MRI from February 2024 showed stable findings compared to January 2023.  She has a mild pituitary abnormality, possibly a small adenoma.  Findings were considered to be stable on MRI.  We talked about her results again today. She is advised to follow-up routinely in sleep clinic to see one of our nurse practitioners in 6 months, we can plan to order an MRI of the brain for surveillance in early 2025. I answered all her questions today and she was in agreement. I spent 30 minutes in total face-to-face time and in reviewing records during pre-charting, more than 50% of  which was spent in counseling and coordination of care, reviewing test results, reviewing medications and treatment regimen and/or in discussing or reviewing the diagnosis of OSA, pituitary abnormality, the prognosis and treatment options. Pertinent laboratory and imaging test results that were available during this visit with the patient were reviewed by me and considered in my medical decision making (see chart for details).

## 2022-12-31 NOTE — Progress Notes (Signed)
Order faxed to [facility].  [phone]. Confirmation received.

## 2023-01-01 DIAGNOSIS — G4733 Obstructive sleep apnea (adult) (pediatric): Secondary | ICD-10-CM | POA: Diagnosis not present

## 2023-01-28 DIAGNOSIS — L821 Other seborrheic keratosis: Secondary | ICD-10-CM | POA: Diagnosis not present

## 2023-01-28 DIAGNOSIS — D2271 Melanocytic nevi of right lower limb, including hip: Secondary | ICD-10-CM | POA: Diagnosis not present

## 2023-01-28 DIAGNOSIS — D225 Melanocytic nevi of trunk: Secondary | ICD-10-CM | POA: Diagnosis not present

## 2023-01-28 DIAGNOSIS — L918 Other hypertrophic disorders of the skin: Secondary | ICD-10-CM | POA: Diagnosis not present

## 2023-01-28 DIAGNOSIS — L0101 Non-bullous impetigo: Secondary | ICD-10-CM | POA: Diagnosis not present

## 2023-01-28 DIAGNOSIS — D224 Melanocytic nevi of scalp and neck: Secondary | ICD-10-CM | POA: Diagnosis not present

## 2023-01-28 DIAGNOSIS — D2261 Melanocytic nevi of right upper limb, including shoulder: Secondary | ICD-10-CM | POA: Diagnosis not present

## 2023-01-28 DIAGNOSIS — L570 Actinic keratosis: Secondary | ICD-10-CM | POA: Diagnosis not present

## 2023-03-27 ENCOUNTER — Telehealth: Payer: Self-pay

## 2023-03-27 NOTE — Progress Notes (Signed)
[facility] Customer service manager [facility]) Quality Pharmacy Team [facility]   03/27/2023  Meghan Avila [employer] 03-Dec-1950 [id number]  Reason for referral: Medication Adherence  Referral source:  [facility] Payor Report for Quality  Current insurance: [employer]  Reason for call: Patient has atorvastatin on file and per payor data and discussion with [facility] patient has not picked up medication. She was outreached and declined HIPAA identifiers as she was not expecting my call and did not know me - even after I identified myself and reasoning for calling twice.   Outreach:  Unsuccessful telephone call attempt #1 to patient.     Plan:  -I will close [facility] pharmacy case at this time as I have been unable to establish and/or maintain contact with patient. - I will send an action item to [facility] clinic.   Thank you for allowing [facility] pharmacy to be a part of this patient's care.   Cephus Shelling, PharmD Clinical Pharmacist [facility] Cell: [phone]

## 2023-04-16 ENCOUNTER — Telehealth: Payer: Self-pay

## 2023-04-16 DIAGNOSIS — I872 Venous insufficiency (chronic) (peripheral): Secondary | ICD-10-CM

## 2023-04-16 NOTE — Addendum Note (Signed)
Addended by: Leilani Able, Vashon Riordan A on: 04/16/2023 03:44 PM   Modules accepted: Orders

## 2023-04-16 NOTE — Telephone Encounter (Addendum)
Pt called c/o of swelling of foot and leg, requested appointment.  Reviewed pt's chart, returned call for clarification, no answer, lf vm.  Caller: Patient  Concern: swelling  Location: left leg  Description:  ongoing for years  Treatments:  elevating, wearing compressions, exercising at [facility]  Resolution: Appointment scheduled for non-urgent appt with reflux [place]  Next Appt: Appointment scheduled for 05/12/23 @ 1100

## 2023-05-12 ENCOUNTER — Ambulatory Visit: Payer: PPO | Admitting: Physician Assistant

## 2023-05-12 ENCOUNTER — Ambulatory Visit (HOSPITAL_COMMUNITY)
Admission: RE | Admit: 2023-05-12 | Discharge: 2023-05-12 | Disposition: A | Discharge status: Home / Self Care | Payer: PPO | Source: Ambulatory Visit | Attending: Vascular Surgery | Admitting: Vascular Surgery

## 2023-05-12 VITALS — BP 129/77 | HR 79 | Temp 98.7°F | Resp 18 | Ht 60.0 in | Wt 149.5 lb

## 2023-05-12 DIAGNOSIS — I872 Venous insufficiency (chronic) (peripheral): Secondary | ICD-10-CM

## 2023-05-18 DIAGNOSIS — M818 Other osteoporosis without current pathological fracture: Secondary | ICD-10-CM | POA: Diagnosis not present

## 2023-05-18 NOTE — Progress Notes (Signed)
Office Note  History of Present Illness   Meghan Avila is a 72 y.o. (1951/02/28) female who presents for repeat evaluation of left lower extremity edema.  She has a prior history of left greater saphenous vein ablation in 2008 in [place].  She has been seen by our office in the past for worsening left lower leg swelling.  She states her left leg swelling has been getting worse over the past few months.  She occasionally wears her knee-high compression stockings but only experiences mild benefit from them.  She does not elevate her legs much.  She has no recent or prior diagnosis of DVT.  She has no previous venous ulcerations.  Past Medical History:  Diagnosis Date   Arthritis    Family history of adverse reaction to anesthesia    post nausea and vomitting   GERD (gastroesophageal reflux disease)    hx of   H/O wheezing    occ.   Hand swelling    related to lymph nodes    Headache    migraines   History of hiatal hernia    Leg swelling    left leg -edema knee level to ankle.   Osteoporosis    Peripheral vascular disease (HCC)    left leg    PONV (postoperative nausea and vomiting)    Scoliosis    Vertigo     Past Surgical History:  Procedure Laterality Date   ANAL RECTAL MANOMETRY N/A 08/03/2019   Procedure: ANO RECTAL MANOMETRY;  Surgeon: Romie Levee, MD;  Location: [facility];  Service: Endoscopy;  Laterality: N/A;   BACK SURGERY     scoliosis surgery 1970 and 1972   BREAST REDUCTION SURGERY  1997   CHOLECYSTECTOMY  1992   laparoscopic   COLONOSCOPY WITH PROPOFOL N/A 04/19/2015   Procedure: COLONOSCOPY WITH PROPOFOL;  Surgeon: Charolett Bumpers, MD;  Location: [facility];  Service: Endoscopy;  Laterality: N/A;   DEEP AXILLARY SENTINEL NODE BIOPSY / EXCISION     x2   ESOPHAGEAL MANOMETRY N/A 05/04/2015   Procedure: ESOPHAGEAL MANOMETRY (EM);  Surgeon: Charolett Bumpers, MD;  Location: [facility];  Service: Endoscopy;  Laterality: N/A;    ESOPHAGOGASTRODUODENOSCOPY (EGD) WITH PROPOFOL N/A 04/19/2015   Procedure: ESOPHAGOGASTRODUODENOSCOPY (EGD) WITH PROPOFOL;  Surgeon: Charolett Bumpers, MD;  Location: [facility];  Service: Endoscopy;  Laterality: N/A;   HEMORROIDECTOMY  2002, 2005   hiatal hernia surgery      JOINT REPLACEMENT     total right knee Dr. Thomasena Edis 02-26-18   LASER ABLATION  2008   left leg    left knee surgery - torn meniscus (left)     LIPOMA EXCISION N/A 09/21/2015   Procedure: EXCISION 6CM BACK LIPOMA;  Surgeon: Axel Filler, MD;  Location: [facility];  Service: General;  Laterality: N/A;   MENISCUS REPAIR     right   REDUCTION MAMMAPLASTY Bilateral 1998   right shoulder rotator cuff surgery      ROBOTIC ASSISTED LAPAROSCOPIC SACROCOLPOPEXY N/A 05/30/2021   Procedure: XI ROBOTIC ASSISTED LAPAROSCOPIC SACROCOLPOPEXY;  Surgeon: Crist Fat, MD;  Location: [facility];  Service: Urology;  Laterality: N/A;   scoliosis  1970, 1972   no retained hardware   TOTAL KNEE ARTHROPLASTY Right 02/26/2018   Procedure: RIGHT TOTAL KNEE ARTHROPLASTY;  Surgeon: Eugenia Mcalpine, MD;  Location: [facility];  Service: Orthopedics;  Laterality: Right;    Social History   Socioeconomic History   Marital  status: Widowed    Spouse name: Not on file   Number of children: Not on file   Years of education: Not on file   Highest education level: Not on file  Occupational History   Occupation: Payroll Admin   Tobacco Use   Smoking status: Former    Current packs/day: 0.00    Average packs/day: 1 pack/day for 5.0 years (5.0 ttl pk-yrs)    Types: Cigarettes    Start date: 10/06/1973    Quit date: 10/06/1978    Years since quitting: 44.6   Smokeless tobacco: Never  Vaping Use   Vaping status: Never Used  Substance and Sexual Activity   Alcohol use: Yes    Comment: occ beer or glass of wine   Drug use: No   Sexual activity: Yes  Other Topics Concern   Not on file  Social History Narrative   Lives at home alone   Right  handed   Caffeine: 1-2 cups/day   Social Determinants of Health   Financial Resource Strain: Not on file  Food Insecurity: Not on file  Transportation Needs: Not on file  Physical Activity: Not on file  Stress: Not on file  Social Connections: Not on file  Intimate Partner Violence: Not on file    Family History  Problem Relation Age of Onset   Varicose Veins Mother    Rheum arthritis Father    Cancer Sister    Heart disease Sister        before age 20   Hypertension Sister    Varicose Veins Sister    Heart attack Sister    Rheum arthritis Sister    Allergies Sister    Hyperlipidemia Brother    Sleep apnea Brother    Breast cancer Neg Hx     Current Outpatient Medications  Medication Sig Dispense Refill   amitriptyline (ELAVIL) 10 MG tablet TAKE 1 TABLET BY MOUTH EVERYDAY AT BEDTIME 90 tablet 2   aspirin-acetaminophen-caffeine (EXCEDRIN MIGRAINE) 250-250-65 MG tablet Take 1 tablet by mouth as needed.     atorvastatin (LIPITOR) 10 MG tablet Take 10 mg by mouth in the morning.     denosumab (PROLIA) 60 MG/ML SOSY injection USE AS DIRECTED SUBCUTANEOUS EVERY 6 MONTHS AND 1 DAY     diclofenac sodium (VOLTAREN) 1 % GEL Apply 1 application topically 2 (two) times daily as needed (KNEE PAIN).     ibuprofen (ADVIL) 200 MG tablet Take 400 mg by mouth as needed.     melatonin 3 MG TABS tablet Take 3 mg by mouth at bedtime.     No current facility-administered medications for this visit.    Allergies  Allergen Reactions   Zantac [Ranitidine Hcl] Anaphylaxis   Morphine And Codeine Nausea And Vomiting    Morphine, Hydrocodone Tolerates Dilaudid, Fentanyl per Epic   Prednisone Diarrhea and Nausea And Vomiting   Tramadol Nausea And Vomiting    AMS    REVIEW OF SYSTEMS (negative unless checked):   Cardiac:  []  Chest pain or chest pressure? []  Shortness of breath upon activity? []  Shortness of breath when lying flat? []  Irregular heart rhythm?  Vascular:  []  Pain in  calf, thigh, or hip brought on by walking? []  Pain in feet at night that wakes you up from your sleep? []  Blood clot in your veins? [x]  Leg swelling?  Pulmonary:  []  Oxygen at home? []  Productive cough? []  Wheezing?  Neurologic:  []  Sudden weakness in arms or legs? []  Sudden numbness in  arms or legs? []  Sudden onset of difficult speaking or slurred speech? []  Temporary loss of vision in one eye? []  Problems with dizziness?  Gastrointestinal:  []  Blood in stool? []  Vomited blood?  Genitourinary:  []  Burning when urinating? []  Blood in urine?  Psychiatric:  []  Major depression  Hematologic:  []  Bleeding problems? []  Problems with blood clotting?  Dermatologic:  []  Rashes or ulcers?  Constitutional:  []  Fever or chills?  Ear/Nose/Throat:  []  Change in hearing? []  Nose bleeds? []  Sore throat?  Musculoskeletal:  []  Back pain? []  Joint pain? []  Muscle pain?   Physical Examination     Vitals:   05/12/23 1104  BP: 129/77  Pulse: 79  Resp: 18  Temp: 98.7 F (37.1 C)  TempSrc: Temporal  SpO2: 97%  Weight: 149 lb 8 oz (67.8 kg)  Height: 5' (1.524 m)   Body mass index is 29.2 kg/m.  General:  WDWN in NAD; vital signs documented above Gait: Not observed HENT: WNL, normocephalic Pulmonary: normal non-labored breathing , without Rales, rhonchi,  wheezing Cardiac: regular Abdomen: soft, NT, no masses Skin: without rashes Vascular Exam/Pulses: bilateral lower extremities warm and well perfused Extremities: 1+ edema of LLE. No ulcerations, hyperpigmentation, or dermatitis Musculoskeletal: no muscle wasting or atrophy  Neurologic: A&O X 3;  No focal weakness or paresthesias are detected Psychiatric:  The pt has Normal affect.  Non-invasive Vascular Imaging   LLE Venous Insufficiency Duplex (05/12/2023):  +--------------+---------+------+-----------+------------+--------------+  LEFT         Reflux NoRefluxReflux TimeDiameter cmsComments                                 Yes                                         +--------------+---------+------+-----------+------------+--------------+  CFV          no                                                    +--------------+---------+------+-----------+------------+--------------+  FV mid        no                                                    +--------------+---------+------+-----------+------------+--------------+  Popliteal    no                                                    +--------------+---------+------+-----------+------------+--------------+  GSV at SFJ              yes    >500 ms      .465                    +--------------+---------+------+-----------+------------+--------------+  GSV prox thigh  prior ablation  +--------------+---------+------+-----------+------------+--------------+  GSV mid thigh                                       prior ablation  +--------------+---------+------+-----------+------------+--------------+  GSV dist thigh                                      prior ablation  +--------------+---------+------+-----------+------------+--------------+  GSV prox calf           yes    >500 ms      .429                    +--------------+---------+------+-----------+------------+--------------+  GSV mid calf            yes    >500 ms      .196                    +--------------+---------+------+-----------+------------+--------------+  SSV Pop Fossa no                            .223                    +--------------+---------+------+-----------+------------+--------------+  SSV prox calf no                            .219                    +--------------+---------+------+-----------+------------+--------------+    Medical Decision Making   Meghan Avila is a 72 y.o. female who presents for left lower extremity swelling  Based  on the patient's LLE venous duplex, there is reflux in the left greater saphenous vein at the saphenofemoral junction, proximal calf, and mid calf.  The greater saphenous vein from the mid to distal thigh cannot be visualized due to prior ablation.  The remainder of the deep and superficial venous system is competent.  There is no evidence of DVT on exam The patient has a remote history of left greater saphenous vein ablation in 2008 at an outside facility.  She states that she still has ongoing left leg swelling since her ablation.  She feels like her left leg swelling got worse after her ablation instead of better. She denies any recent or prior history of DVT.  She denies any active or previous ulcerations.  She occasionally wears knee-high stockings to help with her swelling, which helps mildly.  She elevates her legs as needed to help with swelling On exam her left lower leg has 1+ edema.  Most of this is localized around her distal calf and ankle.  I have explained to the patient the remainder of her greater saphenous vein is too small for ablation and she likely would not benefit from it, given that she did not benefit from her first ablation procedure. The best way to continue to control her leg swelling is with properly fitting compression stockings, exercise, weight management, and leg elevation. She can follow-up with our office as needed  Ernestene Mention, PA-C [facility] Office: [phone]  05/18/2023, 4:32 PM  Clinic MD: Lenell Antu

## 2023-07-01 DIAGNOSIS — Z1331 Encounter for screening for depression: Secondary | ICD-10-CM | POA: Diagnosis not present

## 2023-07-01 DIAGNOSIS — I2510 Atherosclerotic heart disease of native coronary artery without angina pectoris: Secondary | ICD-10-CM | POA: Diagnosis not present

## 2023-07-01 DIAGNOSIS — Z0000 Encounter for general adult medical examination without abnormal findings: Secondary | ICD-10-CM | POA: Diagnosis not present

## 2023-07-01 DIAGNOSIS — Z23 Encounter for immunization: Secondary | ICD-10-CM | POA: Diagnosis not present

## 2023-07-01 DIAGNOSIS — Z79899 Other long term (current) drug therapy: Secondary | ICD-10-CM | POA: Diagnosis not present

## 2023-07-01 DIAGNOSIS — Z6828 Body mass index (BMI) 28.0-28.9, adult: Secondary | ICD-10-CM | POA: Diagnosis not present

## 2023-07-07 ENCOUNTER — Other Ambulatory Visit: Payer: Self-pay | Admitting: Family Medicine

## 2023-07-07 DIAGNOSIS — Z1231 Encounter for screening mammogram for malignant neoplasm of breast: Secondary | ICD-10-CM

## 2023-07-09 DIAGNOSIS — Z23 Encounter for immunization: Secondary | ICD-10-CM | POA: Diagnosis not present

## 2023-07-09 DIAGNOSIS — M810 Age-related osteoporosis without current pathological fracture: Secondary | ICD-10-CM | POA: Diagnosis not present

## 2023-07-09 DIAGNOSIS — I2510 Atherosclerotic heart disease of native coronary artery without angina pectoris: Secondary | ICD-10-CM | POA: Diagnosis not present

## 2023-07-20 ENCOUNTER — Telehealth: Payer: Self-pay

## 2023-07-20 NOTE — Progress Notes (Unsigned)
PATIENT: Meghan Avila DOB: Dec 19, 1950  REASON FOR VISIT: follow up HISTORY FROM: patient  No chief complaint on file.    HISTORY OF PRESENT ILLNESS:  07/20/23 ALL:  Meghan Avila is a 72 y.o. female here today for follow up for OSA on CPAP.  She was last seen by Dr Frances Furbish 12/2022 and reported not using CPAP.     HISTORY: (copied from Dr Teofilo Pod previous note)  Meghan Avila is a 72 year old right-handed woman with an underlying medical history of reflux disease, osteoporosis, peripheral vascular disease, peripheral edema, vitamin D deficiency, diverticulosis, arthritis with status post right knee replacement, scoliosis (s/p rod placement at age 47, s/p rod removal at age 56), borderline obesity, and vertigo, who presents for follow-up consultation of her migraine and sleep apnea, on autoPAP therapy. The patient is unaccompanied today. I last saw her on 05/14/2022 , at which time she reported doing a little better with her CPAP machine, she has adjusted better.  She reported stress.  She reported intermittent vertigo symptoms for which she had been to PT. She was encouraged to drink more water.    She had an interim repeat brain MRI with and without contrast on 12/03/2022 and I reviewed the results: IMPRESSION: Unchanged intrinsically T1 hyperintense lesion at the base of the infundibulum measuring 5 x 3 x 4 mm. If the patient has clinical signs of growth hormone deficiency this could represent an ectopic pituitary. However in the absence of endocrine dysfunction pituitary adenoma remains a differential consideration. She was notified by phone call and encouraged to see her PCP and potentially also endocrinology.   Today, 12/31/2022: I reviewed her AutoPap compliance data from the past year.  She essentially stopped using her CPAP completely in December 2023.  She had sporadic years starting in September 2023.  She reports that in December her mom moved in with her.  She has  disrupted sleep as she has to assist her mom even overnight.  Mom is 39 years old.  Patient gets about 4 hours of sleep on an average night she reports.  She endorses stress.  She does not hydrate quite as well but does report that she has done a little better with water intake.  She no longer takes the amitriptyline.  She has occasional headaches, including morning headaches.  She had a checkup with primary care and had blood work done.  She has not been referred to an endocrinologist but will double check with primary care.  She is interested in getting back on her AutoPap machine.  She will need new supplies.  She had updated eye exam in October 2023 and received new eyeglasses.   The patient's allergies, current medications, family history, past medical history, past social history, past surgical history and problem list were reviewed and updated as appropriate.    Previously:   I saw her on 10/09/21, at which time she reported struggling with AutoPap therapy, she had difficulty keeping the mask on.  We had changed her pressure to help with tolerance.  She was waking up with headaches.  I suggested she start gabapentin.    She saw Ihor Austin, NP in the interim on 01/08/2022, at which time she was good with compliance, she reported that she had not started the gabapentin due to concern for side effects.  She had a recent episode of vertigo.  She was advised to start amitriptyline at night for migraine prevention.    I reviewed her AutoPap compliance data  from 04/02/2022 through 05/01/2022, which is a total of 30 days, during which time she used her machine only 19 days with percent usage days greater than 4 hours at 50%, indicating suboptimal compliance, average usage of 5 hours and 45 minutes, residual AHI: 0.5/h, 95th percentile pressure at 8.8 cm with a range of 4 to 9 cm, leak on the higher side with a 95th percentile at 27.5 L/min.  In the past 90 days, she has had a similar compliance, no usage  after 05/01/2022.       I first met her at the request of her primary care physician on 04/18/2021, at which time she reported a 2 to 3-year history of migrainous headaches.  He also reported intermittent vertigo attacks since April 2022.  She had worked with physical therapy and had seen ENT.  I suggested we proceed with a sleep study as she reported snoring and morning headaches as well as not sleeping well at night.  She had a home sleep test on 05/22/2021, which indicated severe obstructive sleep apnea with an AHI of 38.9/h, O2 nadir 77%.  She was advised to start AutoPap therapy.  Her set up date was 07/16/2021.  She has a ResMed air sense 11.   She had recently undergone a brain MRI without contrast, and I reviewed the results: IMPRESSION: 1. No evidence of acute infarct. 2. Thickening of the pituitary infundibulum. Recommend pituitary protocol MRI with contrast to further characterize and to exclude a mass. 3. Mild to moderate chronic microvascular ischemic disease and mild atrophy. 4. Small right mastoid effusion.   Given the findings of the pituitary infundibulum she had a repeat brain MRI with and without contrast on 04/29/2021 and I reviewed the results:   IMPRESSION: 1. Abnormal thickening of the infundibulum, which appears intrinsically T1 hyperintense posteriorly. Additionally, a normal T1 bright spot in the posterior pituitary is not identified. These findings most likely are related to an ectopic posterior pituitary or a benign proteinaceous cyst (such as a Rathke's cleft cyst). Other etiologies such as an infundibular malignancy or cellular infiltrate are thought unlikely given the intrinsic T1 signal. A follow-up pituitary protocol MRI in approximately 6 months may be useful to ensure stability. Also, consider correlation with pituitary function labs. 2. No abnormal enhancement elsewhere.     I reviewed her AutoPap compliance data from 09/08/2021 through 10/08/2021, which is a  total of 30 days, during which time she used her machine 29 days with percent used days greater than 4 hours at 90%, indicating excellent compliance with an average usage of 6 hours and 23 minutes, residual AHI at goal at 1.6/h, leak on the high side with a 95th percentile at 28.9 L/min, 95th percentile of press, range ofsure at 8.8 cm, range of 4-9 cm. We reduced her Maximum pressure in November to 9 cm from 10 cm.  She felt that she was waking up with headaches.  She reports that she does not sleep well with the AutoPap and struggles with it.  She cannot sleep on her back because of her scoliosis and when she sleeps on her side the mask dislodges and sometimes she just takes it off in the middle of the night. She is motivated to continue with treatment but is still struggling.  She takes melatonin every night.  She has had recurrent headaches, they are not always severe but she has taken Excedrin migraine or tension headaches.  She has not had an actual bout of vertigo but sometimes she  feels that a vertigo attack may come on when she turns in bed.   The patient's allergies, current medications, family history, past medical history, past social history, past surgical history and problem list were reviewed and updated as appropriate.      Previously:    04/18/21: (She) reports a history of migrainous headaches for the past 2 to 3 years.  When she first started having migraines her severity was worse.  Lately, she has up to 3 or 4 migraine headaches per month.  She is trying to keep a log.  She describes a throbbing headache, it is primarily in the frontal areas bilaterally, denies any temporal tenderness or neck pain.  She has suffered from intermittent vertigo.  She has completed physical therapy for 4 weeks, without significant improvement.  Her vertigo attacks started back in April 2022 when she had another attack in May 2022.  Symptoms lasted for several days in May.  They were shorter in April.  She had  associated nausea and headaches and vertigo do not typically come together.  She has had intermittent tingling and numbness in her right hand but it seems to be independent of her headaches.  Excedrin Migraine helps her headaches, it does not last several hours or days.  In fact, after taking Excedrin and staying in bed she has no significant residual headache.  She has woken up with a headache typically, headaches are not strictly one-sided.  She had an eye examination last year and is due for October 2022 for her routine follow-up.  She does not have any new visual symptoms no sudden onset of one-sided weakness or numbness or tingling or droopy face or slurring of speech other than intermittent tingling and numbness in her right hand.  She has not fallen.  She limits her caffeine to 1 cup of coffee.  She drinks alcohol rarely.  She quit smoking some 36 years ago.  She lives alone, husband died some 8 years ago, her only son lives with his family in [place].  She does not hydrate very well.  She estimates that she drinks about 3 to 4 cups of water per day.     Of note, she thinks she snores when she sleeps on her back.  She has nocturia about once or twice per average night.  She does not sleep well at night.  She takes melatonin.  She would be reluctant to come in for sleep study but would be willing to consider a home sleep test.   I reviewed your office note from 04/12/2021.  She has had several bouts of vertigo in the past several months.  She saw Dr. Christia Reading in ENT in June 2022 and hearing testing as well as Gilberto Better testing was recommended.  She has had mild hearing loss.  She had further testing through audiology on 04/05/2021 and was found to have mild hearing loss on the right.  Dix-Hallpike was positive bilaterally.  She has had intermittent numbness in her hand.  You ordered a brain MRI.  She had a brain MRI without contrast at [facility] on 04/17/2021 and I was able to review some of  the images through the PACS system but not all of them.  A formal report is pending.  I did not see any obvious abnormality.   She previously had physical therapy with vestibular rehab with modest results.  She was treated for COVID in May 2022 with Paxlovid.   She reports a family history  of migraines affecting her mom and her sister.  She also reports stress, she helps take care of her 25 year old mother.   REVIEW OF SYSTEMS: Out of a complete 14 system review of symptoms, the patient complains only of the following symptoms, and all other reviewed systems are negative.  ESS:  ALLERGIES: Allergies  Allergen Reactions   Zantac [Ranitidine Hcl] Anaphylaxis   Morphine And Codeine Nausea And Vomiting    Morphine, Hydrocodone Tolerates Dilaudid, Fentanyl per Epic   Prednisone Diarrhea and Nausea And Vomiting   Tramadol Nausea And Vomiting    AMS    HOME MEDICATIONS: Outpatient Medications Prior to Visit  Medication Sig Dispense Refill   amitriptyline (ELAVIL) 10 MG tablet TAKE 1 TABLET BY MOUTH EVERYDAY AT BEDTIME 90 tablet 2   aspirin-acetaminophen-caffeine (EXCEDRIN MIGRAINE) 250-250-65 MG tablet Take 1 tablet by mouth as needed.     atorvastatin (LIPITOR) 10 MG tablet Take 10 mg by mouth in the morning.     denosumab (PROLIA) 60 MG/ML SOSY injection USE AS DIRECTED SUBCUTANEOUS EVERY 6 MONTHS AND 1 DAY     diclofenac sodium (VOLTAREN) 1 % GEL Apply 1 application topically 2 (two) times daily as needed (KNEE PAIN).     ibuprofen (ADVIL) 200 MG tablet Take 400 mg by mouth as needed.     melatonin 3 MG TABS tablet Take 3 mg by mouth at bedtime.     No facility-administered medications prior to visit.    PAST MEDICAL HISTORY: Past Medical History:  Diagnosis Date   Arthritis    Family history of adverse reaction to anesthesia    post nausea and vomitting   GERD (gastroesophageal reflux disease)    hx of   H/O wheezing    occ.   Hand swelling    related to lymph nodes     Headache    migraines   History of hiatal hernia    Leg swelling    left leg -edema knee level to ankle.   Osteoporosis    Peripheral vascular disease (HCC)    left leg    PONV (postoperative nausea and vomiting)    Scoliosis    Vertigo     PAST SURGICAL HISTORY: Past Surgical History:  Procedure Laterality Date   ANAL RECTAL MANOMETRY N/A 08/03/2019   Procedure: ANO RECTAL MANOMETRY;  Surgeon: Romie Levee, MD;  Location: [facility];  Service: Endoscopy;  Laterality: N/A;   BACK SURGERY     scoliosis surgery 1970 and 1972   BREAST REDUCTION SURGERY  1997   CHOLECYSTECTOMY  1992   laparoscopic   COLONOSCOPY WITH PROPOFOL N/A 04/19/2015   Procedure: COLONOSCOPY WITH PROPOFOL;  Surgeon: Charolett Bumpers, MD;  Location: [facility];  Service: Endoscopy;  Laterality: N/A;   DEEP AXILLARY SENTINEL NODE BIOPSY / EXCISION     x2   ESOPHAGEAL MANOMETRY N/A 05/04/2015   Procedure: ESOPHAGEAL MANOMETRY (EM);  Surgeon: Charolett Bumpers, MD;  Location: [facility];  Service: Endoscopy;  Laterality: N/A;   ESOPHAGOGASTRODUODENOSCOPY (EGD) WITH PROPOFOL N/A 04/19/2015   Procedure: ESOPHAGOGASTRODUODENOSCOPY (EGD) WITH PROPOFOL;  Surgeon: Charolett Bumpers, MD;  Location: [facility];  Service: Endoscopy;  Laterality: N/A;   HEMORROIDECTOMY  2002, 2005   hiatal hernia surgery      JOINT REPLACEMENT     total right knee Dr. Thomasena Edis 02-26-18   LASER ABLATION  2008   left leg    left knee surgery - torn meniscus (left)     LIPOMA EXCISION N/A 09/21/2015  Procedure: EXCISION 6CM BACK LIPOMA;  Surgeon: Axel Filler, MD;  Location: [facility];  Service: General;  Laterality: N/A;   MENISCUS REPAIR     right   REDUCTION MAMMAPLASTY Bilateral 1998   right shoulder rotator cuff surgery      ROBOTIC ASSISTED LAPAROSCOPIC SACROCOLPOPEXY N/A 05/30/2021   Procedure: XI ROBOTIC ASSISTED LAPAROSCOPIC SACROCOLPOPEXY;  Surgeon: Crist Fat, MD;  Location: [facility];  Service: Urology;   Laterality: N/A;   scoliosis  1970, 1972   no retained hardware   TOTAL KNEE ARTHROPLASTY Right 02/26/2018   Procedure: RIGHT TOTAL KNEE ARTHROPLASTY;  Surgeon: Eugenia Mcalpine, MD;  Location: [facility];  Service: Orthopedics;  Laterality: Right;    FAMILY HISTORY: Family History  Problem Relation Age of Onset   Varicose Veins Mother    Rheum arthritis Father    Cancer Sister    Heart disease Sister        before age 4   Hypertension Sister    Varicose Veins Sister    Heart attack Sister    Rheum arthritis Sister    Allergies Sister    Hyperlipidemia Brother    Sleep apnea Brother    Breast cancer Neg Hx     SOCIAL HISTORY: Social History   Socioeconomic History   Marital status: Widowed    Spouse name: Not on file   Number of children: Not on file   Years of education: Not on file   Highest education level: Not on file  Occupational History   Occupation: Payroll Admin   Tobacco Use   Smoking status: Former    Current packs/day: 0.00    Average packs/day: 1 pack/day for 5.0 years (5.0 ttl pk-yrs)    Types: Cigarettes    Start date: 10/06/1973    Quit date: 10/06/1978    Years since quitting: 44.8   Smokeless tobacco: Never  Vaping Use   Vaping status: Never Used  Substance and Sexual Activity   Alcohol use: Yes    Comment: occ beer or glass of wine   Drug use: No   Sexual activity: Yes  Other Topics Concern   Not on file  Social History Narrative   Lives at home alone   Right handed   Caffeine: 1-2 cups/day   Social Determinants of Health   Financial Resource Strain: Not on file  Food Insecurity: Not on file  Transportation Needs: Not on file  Physical Activity: Not on file  Stress: Not on file  Social Connections: Not on file  Intimate Partner Violence: Not on file     PHYSICAL EXAM  There were no vitals filed for this visit. There is no height or weight on file to calculate BMI.  Generalized: Well developed, in no acute distress  Cardiology:  normal rate and rhythm, no murmur noted Respiratory: clear to auscultation bilaterally  Neurological examination  Mentation: Alert oriented to time, place, history taking. Follows all commands speech and language fluent Cranial nerve II-XII: Pupils were equal round reactive to light. Extraocular movements were full, visual field were full  Motor: The motor testing reveals 5 over 5 strength of all 4 extremities. Good symmetric motor tone is noted throughout.  Gait and station: Gait is normal.    DIAGNOSTIC DATA (LABS, IMAGING, TESTING) - I reviewed patient records, labs, notes, testing and imaging myself where available.      No data to display           Lab Results  Component Value Date  WBC 5.7 05/22/2021   HGB 10.6 (L) 05/31/2021   HCT 32.4 (L) 05/31/2021   MCV 86.8 05/22/2021   PLT 210 05/22/2021      Component Value Date/Time   NA 144 11/12/2022 1140   K 4.7 11/12/2022 1140   CL 106 11/12/2022 1140   CO2 25 11/12/2022 1140   GLUCOSE 92 11/12/2022 1140   GLUCOSE 130 (H) 05/31/2021 0443   BUN 17 11/12/2022 1140   CREATININE 0.70 11/12/2022 1140   CALCIUM 8.9 11/12/2022 1140   PROT 6.0 11/12/2022 1140   ALBUMIN 4.4 11/12/2022 1140   AST 19 11/12/2022 1140   ALT 20 11/12/2022 1140   ALKPHOS 74 11/12/2022 1140   BILITOT 0.2 11/12/2022 1140   GFRNONAA >60 05/31/2021 0443   GFRAA >60 02/27/2018 0454   No results found for: "CHOL", "HDL", "LDLCALC", "LDLDIRECT", "TRIG", "CHOLHDL" No results found for: "HGBA1C" No results found for: "VITAMINB12" Lab Results  Component Value Date   TSH 1.17 03/13/2015     ASSESSMENT AND PLAN 72 y.o. year old female  has a past medical history of Arthritis, Family history of adverse reaction to anesthesia, GERD (gastroesophageal reflux disease), H/O wheezing, Hand swelling, Headache, History of hiatal hernia, Leg swelling, Osteoporosis, Peripheral vascular disease (HCC), PONV (postoperative nausea and vomiting), Scoliosis, and  Vertigo. here with   No diagnosis found.    Verdell Carmine Bergstresser is doing well on CPAP therapy. Compliance report reveals ***. *** was encouraged to continue using CPAP nightly and for greater than 4 hours each night. We will update supply orders as indicated. Risks of untreated sleep apnea review and education materials provided. Healthy lifestyle habits encouraged. *** will follow up in ***, sooner if needed. *** verbalizes understanding and agreement with this plan.    No orders of the defined types were placed in this encounter.    No orders of the defined types were placed in this encounter.     Shawnie Dapper, FNP-C 07/20/2023, 4:55 PM [facility] [address] [place], [place] [address] [phone]

## 2023-07-20 NOTE — Telephone Encounter (Signed)
Called pt about her CPAP Machine and wether or not she was still using her CPAP Machine, pt stated that she hasn't used her machine for over 5-6 months now, and doesn't like the machine. Asked pt if she would like to keep her appointment and discuss other treatment options and pt stated that she would.

## 2023-07-21 ENCOUNTER — Encounter: Payer: Self-pay | Admitting: Family Medicine

## 2023-07-21 ENCOUNTER — Ambulatory Visit: Payer: PPO | Admitting: Family Medicine

## 2023-07-21 VITALS — BP 100/62 | HR 77 | Ht 60.0 in | Wt 145.8 lb

## 2023-07-21 DIAGNOSIS — I499 Cardiac arrhythmia, unspecified: Secondary | ICD-10-CM

## 2023-07-21 DIAGNOSIS — G4733 Obstructive sleep apnea (adult) (pediatric): Secondary | ICD-10-CM | POA: Diagnosis not present

## 2023-07-21 DIAGNOSIS — E237 Disorder of pituitary gland, unspecified: Secondary | ICD-10-CM

## 2023-07-21 NOTE — Patient Instructions (Addendum)
Please use your CPAP regularly. While your insurance requires that you use CPAP at least 4 hours each night on 70% of the nights, I recommend, that you not skip any nights and use it throughout the night if you can. Getting used to CPAP and staying with the treatment long term does take time and patience and discipline. Untreated obstructive sleep apnea when it is moderate to severe can have an adverse impact on cardiovascular health and raise her risk for heart disease, arrhythmias, hypertension, congestive heart failure, stroke and diabetes. Untreated obstructive sleep apnea causes sleep disruption, nonrestorative sleep, and sleep deprivation. This can have an impact on your day to day functioning and cause daytime sleepiness and impairment of cognitive function, memory loss, mood disturbance, and problems focussing. Using CPAP regularly can improve these symptoms.  We will update supply orders, today. Please continue follow up with PCP and or endocrinology as needed. Please let Dr Hyacinth Meeker know that we heard a few irregular beats with your cardiac exam, today. I am not overly worried but want you to have that checked out due to family history.   Follow up with me in 6 months

## 2023-07-22 DIAGNOSIS — H2513 Age-related nuclear cataract, bilateral: Secondary | ICD-10-CM | POA: Diagnosis not present

## 2023-07-22 DIAGNOSIS — H43812 Vitreous degeneration, left eye: Secondary | ICD-10-CM | POA: Diagnosis not present

## 2023-07-22 DIAGNOSIS — H40013 Open angle with borderline findings, low risk, bilateral: Secondary | ICD-10-CM | POA: Diagnosis not present

## 2023-07-22 NOTE — Progress Notes (Signed)
Cpap supply order faxed to [phone] [facility]. Fax confirmation received.

## 2023-08-10 DIAGNOSIS — Z23 Encounter for immunization: Secondary | ICD-10-CM | POA: Diagnosis not present

## 2023-08-10 DIAGNOSIS — Z9989 Dependence on other enabling machines and devices: Secondary | ICD-10-CM | POA: Diagnosis not present

## 2023-08-10 DIAGNOSIS — D649 Anemia, unspecified: Secondary | ICD-10-CM | POA: Diagnosis not present

## 2023-08-10 DIAGNOSIS — I499 Cardiac arrhythmia, unspecified: Secondary | ICD-10-CM | POA: Diagnosis not present

## 2023-08-10 DIAGNOSIS — Z6828 Body mass index (BMI) 28.0-28.9, adult: Secondary | ICD-10-CM | POA: Diagnosis not present

## 2023-08-13 DIAGNOSIS — I499 Cardiac arrhythmia, unspecified: Secondary | ICD-10-CM | POA: Diagnosis not present

## 2023-08-18 ENCOUNTER — Other Ambulatory Visit: Payer: Self-pay | Admitting: Nurse Practitioner

## 2023-08-18 DIAGNOSIS — Z01419 Encounter for gynecological examination (general) (routine) without abnormal findings: Secondary | ICD-10-CM | POA: Diagnosis not present

## 2023-08-18 DIAGNOSIS — N6324 Unspecified lump in the left breast, lower inner quadrant: Secondary | ICD-10-CM | POA: Diagnosis not present

## 2023-08-18 DIAGNOSIS — Z1211 Encounter for screening for malignant neoplasm of colon: Secondary | ICD-10-CM | POA: Diagnosis not present

## 2023-08-18 DIAGNOSIS — N8110 Cystocele, unspecified: Secondary | ICD-10-CM | POA: Diagnosis not present

## 2023-09-02 DIAGNOSIS — I499 Cardiac arrhythmia, unspecified: Secondary | ICD-10-CM | POA: Diagnosis not present

## 2023-09-07 ENCOUNTER — Other Ambulatory Visit: Payer: Self-pay | Admitting: Nurse Practitioner

## 2023-09-07 ENCOUNTER — Ambulatory Visit
Admission: RE | Admit: 2023-09-07 | Discharge: 2023-09-07 | Disposition: A | Discharge status: Home / Self Care | Payer: PPO | Source: Ambulatory Visit | Attending: Nurse Practitioner | Admitting: Nurse Practitioner

## 2023-09-07 DIAGNOSIS — N6324 Unspecified lump in the left breast, lower inner quadrant: Secondary | ICD-10-CM | POA: Diagnosis not present

## 2023-09-08 ENCOUNTER — Other Ambulatory Visit: Payer: Self-pay | Admitting: Anesthesiology

## 2023-09-08 ENCOUNTER — Other Ambulatory Visit: Payer: Self-pay | Admitting: Neurology

## 2023-09-08 DIAGNOSIS — I499 Cardiac arrhythmia, unspecified: Secondary | ICD-10-CM | POA: Diagnosis not present

## 2023-09-08 NOTE — Telephone Encounter (Signed)
Called pt regarding Amitriptyline refill request. Pt advised that in her chart it stated that she had discontinued the medication in March/2024. Pt stated she restarted the Amitriptyline about 3 months ago, stated that she is doing well with it however, she does get a headache every now and then with it. Pt was advised that approval is needed from physician.

## 2023-09-09 ENCOUNTER — Encounter: Payer: Self-pay | Admitting: Internal Medicine

## 2023-09-09 ENCOUNTER — Ambulatory Visit: Payer: PPO | Attending: Internal Medicine | Admitting: Internal Medicine

## 2023-09-09 VITALS — BP 128/80 | HR 86 | Ht 60.0 in | Wt 149.8 lb

## 2023-09-09 DIAGNOSIS — Z8249 Family history of ischemic heart disease and other diseases of the circulatory system: Secondary | ICD-10-CM | POA: Insufficient documentation

## 2023-09-09 DIAGNOSIS — G4733 Obstructive sleep apnea (adult) (pediatric): Secondary | ICD-10-CM | POA: Diagnosis not present

## 2023-09-09 DIAGNOSIS — I491 Atrial premature depolarization: Secondary | ICD-10-CM | POA: Insufficient documentation

## 2023-09-09 DIAGNOSIS — I4729 Other ventricular tachycardia: Secondary | ICD-10-CM | POA: Diagnosis not present

## 2023-09-09 MED ORDER — METOPROLOL TARTRATE 25 MG PO TABS: 25.0000 mg | ORAL_TABLET | Freq: Two times a day (BID) | ORAL | 3 refills | Status: DC

## 2023-09-09 NOTE — Progress Notes (Signed)
Cardiology Office Note  Date: 09/09/2023   ID: [name], [name] [birth year], MRN [id number]  PCP:  Sigmund Hazel, MD  Cardiologist:  Marjo Bicker, MD Electrophysiologist:  None   History of Present Illness: Meghan Avila is a 72 y.o. female known to have family history of long QT syndrome, OSA was referred to cardiology clinic for evaluation of PACs.  Patient underwent 2-week event monitor due to asymptomatic PVCs and was found to have 11.5% PAC burden and 2 runs of NSVT, longest lasting 7 beats.  She has no symptoms, overall doing great, no angina, DOE, dizziness, syncope, leg swelling.  She has chronic venous insufficiency, has leg swelling from it, wears compression socks.  She has a family history of lung QT syndrome, all of her siblings passed away at a younger age from it.  She has a sister that has A-fib.  Her 14 year old mother passed away recently in 25-Mar-2023 and after that she started to take care of herself by going to the gym.  While taking care of her sick mother in the past, she was wearing oxygen at night but not CPAP.  She says she will need to get back to neurology for OSA management.   Past Medical History:  Diagnosis Date   Arthritis    Family history of adverse reaction to anesthesia    post nausea and vomitting   GERD (gastroesophageal reflux disease)    hx of   H/O wheezing    occ.   Hand swelling    related to lymph nodes    Headache    migraines   History of hiatal hernia    Leg swelling    left leg -edema knee level to ankle.   Osteoporosis    Peripheral vascular disease (HCC)    left leg    PONV (postoperative nausea and vomiting)    Scoliosis    Vertigo     Past Surgical History:  Procedure Laterality Date   ANAL RECTAL MANOMETRY N/A 08/03/2019   Procedure: ANO RECTAL MANOMETRY;  Surgeon: Romie Levee, MD;  Location: [facility];  Service: Endoscopy;  Laterality: N/A;   BACK SURGERY     scoliosis surgery 1970 and 1972    BREAST REDUCTION SURGERY  1997   CHOLECYSTECTOMY  1992   laparoscopic   COLONOSCOPY WITH PROPOFOL N/A 04/19/2015   Procedure: COLONOSCOPY WITH PROPOFOL;  Surgeon: Charolett Bumpers, MD;  Location: [facility];  Service: Endoscopy;  Laterality: N/A;   DEEP AXILLARY SENTINEL NODE BIOPSY / EXCISION     x2   ESOPHAGEAL MANOMETRY N/A 05/04/2015   Procedure: ESOPHAGEAL MANOMETRY (EM);  Surgeon: Charolett Bumpers, MD;  Location: [facility];  Service: Endoscopy;  Laterality: N/A;   ESOPHAGOGASTRODUODENOSCOPY (EGD) WITH PROPOFOL N/A 04/19/2015   Procedure: ESOPHAGOGASTRODUODENOSCOPY (EGD) WITH PROPOFOL;  Surgeon: Charolett Bumpers, MD;  Location: [facility];  Service: Endoscopy;  Laterality: N/A;   HEMORROIDECTOMY  2002, 2005   hiatal hernia surgery      JOINT REPLACEMENT     total right knee Dr. Thomasena Edis 02-26-18   LASER ABLATION  2008   left leg    left knee surgery - torn meniscus (left)     LIPOMA EXCISION N/A 09/21/2015   Procedure: EXCISION 6CM BACK LIPOMA;  Surgeon: Axel Filler, MD;  Location: [facility];  Service: General;  Laterality: N/A;   MENISCUS REPAIR     right   REDUCTION MAMMAPLASTY Bilateral 1998   right shoulder rotator cuff surgery  ROBOTIC ASSISTED LAPAROSCOPIC SACROCOLPOPEXY N/A 05/30/2021   Procedure: XI ROBOTIC ASSISTED LAPAROSCOPIC SACROCOLPOPEXY;  Surgeon: Crist Fat, MD;  Location: [facility];  Service: Urology;  Laterality: N/A;   scoliosis  1970, 1972   no retained hardware   TOTAL KNEE ARTHROPLASTY Right 02/26/2018   Procedure: RIGHT TOTAL KNEE ARTHROPLASTY;  Surgeon: Eugenia Mcalpine, MD;  Location: [facility];  Service: Orthopedics;  Laterality: Right;    Current Outpatient Medications  Medication Sig Dispense Refill   amitriptyline (ELAVIL) 10 MG tablet TAKE 1 TABLET BY MOUTH EVERYDAY AT BEDTIME 90 tablet 1   aspirin-acetaminophen-caffeine (EXCEDRIN MIGRAINE) 250-250-65 MG tablet Take 1 tablet by mouth as needed.     atorvastatin (LIPITOR) 10 MG  tablet Take 10 mg by mouth in the morning.     denosumab (PROLIA) 60 MG/ML SOSY injection USE AS DIRECTED SUBCUTANEOUS EVERY 6 MONTHS AND 1 DAY     diclofenac sodium (VOLTAREN) 1 % GEL Apply 1 application topically 2 (two) times daily as needed (KNEE PAIN).     metoprolol tartrate (LOPRESSOR) 25 MG tablet Take 1 tablet (25 mg total) by mouth 2 (two) times daily. 60 tablet 3   No current facility-administered medications for this visit.   Allergies:  Zantac [ranitidine hcl], Morphine and codeine, Prednisone, and Tramadol   Social History: The patient  reports that she quit smoking about 44 years ago. Her smoking use included cigarettes. She started smoking about 49 years ago. She has a 5 pack-year smoking history. She has never used smokeless tobacco. She reports current alcohol use. She reports that she does not use drugs.   Family History: The patient's family history includes Allergies in her sister; Cancer in her sister; Heart attack in her sister; Heart disease in her sister; Hyperlipidemia in her brother; Hypertension in her sister; Rheum arthritis in her father and sister; Sleep apnea in her brother; Varicose Veins in her mother and sister.   ROS:  Please see the history of present illness. Otherwise, complete review of systems is positive for none  All other systems are reviewed and negative.   Physical Exam: VS:  BP 128/80   Pulse 86   Ht 5' (1.524 m)   Wt 149 lb 12.8 oz (67.9 kg)   SpO2 99%   BMI 29.26 kg/m , BMI Body mass index is 29.26 kg/m.  Wt Readings from Last 3 Encounters:  09/09/23 149 lb 12.8 oz (67.9 kg)  07/21/23 145 lb 12.8 oz (66.1 kg)  05/12/23 149 lb 8 oz (67.8 kg)    General: Patient appears comfortable at rest. HEENT: Conjunctiva and lids normal, oropharynx clear with moist mucosa. Neck: Supple, no elevated JVP or carotid bruits, no thyromegaly. Lungs: Clear to auscultation, nonlabored breathing at rest. Cardiac: Regular rate and rhythm, no S3 or  significant systolic murmur, no pericardial rub. Abdomen: Soft, nontender, no hepatomegaly, bowel sounds present, no guarding or rebound. Extremities: No pitting edema, distal pulses 2+. Skin: Warm and dry. Musculoskeletal: No kyphosis. Neuropsychiatric: Alert and oriented x3, affect grossly appropriate.  Recent Labwork: 11/12/2022: ALT 20; AST 19; BUN 17; Creatinine, Ser 0.70; Potassium 4.7; Sodium 144  No results found for: "CHOL", "TRIG", "HDL", "CHOLHDL", "VLDL", "LDLCALC", "LDLDIRECT"    Assessment and Plan:  11.5% PAC burden, asymptomatic NSVT (longest lasting 7 beats), 2 episodes Family history of long QT syndrome OSA, managed by neurology Chronic venous insufficiency   -Patient underwent 2-week event monitor for asymptomatic PACs and was found to have 11.5% PAC burden and 2 episodes of  NSVT. EKG reviewed from PCPs office, normal sinus rhythm and normal QTc interval.  Due to her family history of A-fib and event monitor evidence of asymptomatic PAC, will start her on metoprolol tartrate 25 mg twice daily to prevent progression of PACs into A-fib.  She also benefit from rate controlling agents for NSVT. Strongly encouraged to be compliant with CPAP, will follow-up with neurology for management of OSA.  She prefers to see Dr Allyson Sabal who has taken care of her multiple family members. -Wears compression socks for chronic venous insufficiency.       Medication Adjustments/Labs and Tests Ordered: Current medicines are reviewed at length with the patient today.  Concerns regarding medicines are outlined above.    Disposition:  Follow up  3 to 6 months with Dr. Allyson Sabal  Signed Markasia Carrol Verne Spurr, MD, 09/09/2023 4:04 PM    [facility] [address] [place], [place], [place] [address]

## 2023-09-09 NOTE — Patient Instructions (Signed)
Medication Instructions:  Your physician has recommended you make the following change in your medication:  Start Metoprolol Tartrate 25 mg twice daily Continue taking all other medications as prescribed  Labwork: None  Testing/Procedures: None  Follow-Up: Your physician recommends that you schedule a follow-up appointment in: 3-6 months  Any Other Special Instructions Will Be Listed Below (If Applicable). Thank you for choosing [facility]!      If you need a refill on your cardiac medications before your next appointment, please call your pharmacy.

## 2023-10-01 ENCOUNTER — Other Ambulatory Visit: Payer: PPO

## 2023-10-05 ENCOUNTER — Other Ambulatory Visit: Payer: PPO

## 2023-10-13 ENCOUNTER — Ambulatory Visit: Payer: PPO

## 2023-10-21 ENCOUNTER — Telehealth: Payer: Self-pay | Admitting: Internal Medicine

## 2023-10-21 NOTE — Telephone Encounter (Signed)
 Pt is requesting a provider switch to become pt of Dr. Katheryne Pane due to a lot of her family members being patients of his. Dr. Mallipeddi has already given the ok on the switch.

## 2023-10-23 NOTE — Telephone Encounter (Signed)
Follow Up:     Patient wants Dr Allyson Sabal to know that he see her sister, [name].

## 2023-10-30 ENCOUNTER — Telehealth: Payer: Self-pay

## 2023-10-30 NOTE — Telephone Encounter (Signed)
   Name: Meghan Avila  DOB: 08/10/1951  MRN: [id number]  Primary Cardiologist: Marjo Bicker, MD  Chart reviewed as part of pre-operative protocol coverage. The patient has an upcoming visit scheduled with Dr. Allyson Sabal on 11/04/2023 at which time clearance can be addressed in case there are any issues that would impact surgical recommendations.  Colonoscopy is not scheduled until 12/07/2023 as below. I added preop FYI to appointment note so that provider is aware to address at time of outpatient visit.  Per office protocol the cardiology provider should forward their finalized clearance decision and recommendations regarding antiplatelet therapy to the requesting party below.      I will route this message as FYI to requesting party and remove this message from the preop box as separate preop APP input not needed at this time.   Please call with any questions.  Joylene Grapes, NP  10/30/2023, 4:27 PM

## 2023-10-30 NOTE — Telephone Encounter (Signed)
   Pre-operative Risk Assessment    Patient Name: Meghan Avila  DOB: 1951/03/02 MRN: [id number]   Date of last office visit: 09/09/23 Date of next office visit: 11/04/23   Request for Surgical Clearance    Procedure:   Colonoscopy   Date of Surgery:  Clearance 12/07/23                                 Surgeon:  Shanon Brow Surgeon's Group or Practice Name:  [facility]  Phone number: [phone] Fax number:  [phone]   Type of Clearance Requested:   - Medical    Type of Anesthesia:   Propofol   Additional requests/questions:    Vance Peper   10/30/2023, 3:59 PM

## 2023-11-04 ENCOUNTER — Encounter: Payer: Self-pay | Admitting: Cardiovascular Disease

## 2023-11-04 ENCOUNTER — Encounter: Payer: Self-pay | Admitting: *Deleted

## 2023-11-04 ENCOUNTER — Ambulatory Visit: Payer: PPO | Attending: Cardiovascular Disease | Admitting: Cardiovascular Disease

## 2023-11-04 VITALS — BP 110/66 | HR 87 | Ht 60.0 in | Wt 147.2 lb

## 2023-11-04 DIAGNOSIS — G4733 Obstructive sleep apnea (adult) (pediatric): Secondary | ICD-10-CM

## 2023-11-04 DIAGNOSIS — I4729 Other ventricular tachycardia: Secondary | ICD-10-CM | POA: Diagnosis not present

## 2023-11-04 DIAGNOSIS — Z8249 Family history of ischemic heart disease and other diseases of the circulatory system: Secondary | ICD-10-CM | POA: Diagnosis not present

## 2023-11-04 DIAGNOSIS — I491 Atrial premature depolarization: Secondary | ICD-10-CM | POA: Diagnosis not present

## 2023-11-04 NOTE — Assessment & Plan Note (Signed)
History of PACs PVCs and nonsustained VT on a 2-week event monitor which are primarily asymptomatic.  She was put on low-dose beta-blocker.

## 2023-11-04 NOTE — Patient Instructions (Signed)
    Follow-Up: At [facility], you and your health needs are our priority.  As part of our continuing mission to provide you with exceptional heart care, we have created designated Provider Care Teams.  These Care Teams include your primary Cardiologist (physician) and Advanced Practice Providers (APPs -  Physician Assistants and Nurse Practitioners) who all work together to provide you with the care you need, when you need it.    Your next appointment:   12 month(s)  Provider:   Nanetta Batty MD

## 2023-11-04 NOTE — Progress Notes (Signed)
11/04/2023 Meghan Avila   [birth year]  [id number]  Primary Physician Sigmund Hazel, MD Primary Cardiologist: Runell Gess MD Nicholes Calamity, [place]  HPI:  Meghan Avila is a 73 y.o. mildly overweight widowed Caucasian female mother of 1 child with no grandchildren who is retired from doing payroll.  She was referred by her PCP, Dr. Hyacinth Meeker to be established.  She was a patient of Dr. Robyne Peers.  She has a history of treated hyperlipidemia.  She does have family history of heart disease with brother who died of a myocardial infarction and a sister who had long QT syndrome, sudden cardiac death requiring ICD implantation.  She is never had a heart attack or stroke.  She denies chest pain or shortness of breath.  She works out at [employer] 3 to 4 days a week without symptoms.  She apparently needs a colonoscopy in near future which she is cleared for low risk..   Current Meds  Medication Sig   amitriptyline (ELAVIL) 10 MG tablet TAKE 1 TABLET BY MOUTH EVERYDAY AT BEDTIME   ascorbic acid (VITAMIN C) 250 MG tablet Take 250 mg by mouth daily.   aspirin-acetaminophen-caffeine (EXCEDRIN MIGRAINE) 250-250-65 MG tablet Take 1 tablet by mouth as needed.   atorvastatin (LIPITOR) 10 MG tablet Take 10 mg by mouth in the morning.   Calcium Carb-Cholecalciferol 600-20 MG-MCG TABS Take 2 tablets by mouth daily at 12 noon.   denosumab (PROLIA) 60 MG/ML SOSY injection USE AS DIRECTED SUBCUTANEOUS EVERY 6 MONTHS AND 1 DAY   diclofenac sodium (VOLTAREN) 1 % GEL Apply 1 application topically 2 (two) times daily as needed (KNEE PAIN).   metoprolol tartrate (LOPRESSOR) 25 MG tablet Take 1 tablet (25 mg total) by mouth 2 (two) times daily.     Allergies  Allergen Reactions   Zantac [Ranitidine Hcl] Anaphylaxis   Alendronate     Other Reaction(s): burping, diarrhea   Morphine And Codeine Nausea And Vomiting    Morphine, Hydrocodone Tolerates Dilaudid, Fentanyl per Epic   Prednisone  Diarrhea and Nausea And Vomiting   Ranitidine Swelling   Tramadol Nausea And Vomiting    AMS    Social History   Socioeconomic History   Marital status: Widowed    Spouse name: Not on file   Number of children: Not on file   Years of education: Not on file   Highest education level: Not on file  Occupational History   Occupation: Payroll Admin   Tobacco Use   Smoking status: Former    Current packs/day: 0.00    Average packs/day: 1 pack/day for 5.0 years (5.0 ttl pk-yrs)    Types: Cigarettes    Start date: 10/06/1973    Quit date: 10/06/1978    Years since quitting: 45.1   Smokeless tobacco: Never  Vaping Use   Vaping status: Never Used  Substance and Sexual Activity   Alcohol use: Yes    Comment: occ beer or glass of wine   Drug use: No   Sexual activity: Yes  Other Topics Concern   Not on file  Social History Narrative   Lives at home alone   Right handed   Caffeine: 1-2 cups/day   Social Drivers of Corporate investment banker Strain: Not on file  Food Insecurity: Not on file  Transportation Needs: Not on file  Physical Activity: Not on file  Stress: Not on file  Social Connections: Not on file  Intimate Partner Violence: Not on file  Review of Systems: General: negative for chills, fever, night sweats or weight changes.  Cardiovascular: negative for chest pain, dyspnea on exertion, edema, orthopnea, palpitations, paroxysmal nocturnal dyspnea or shortness of breath Dermatological: negative for rash Respiratory: negative for cough or wheezing Urologic: negative for hematuria Abdominal: negative for nausea, vomiting, diarrhea, bright red blood per rectum, melena, or hematemesis Neurologic: negative for visual changes, syncope, or dizziness All other systems reviewed and are otherwise negative except as noted above.    Blood pressure 110/66, pulse 87, height 5' (1.524 m), weight 147 lb 3.2 oz (66.8 kg), SpO2 96%.  General appearance: alert and no  distress Neck: no adenopathy, no carotid bruit, no JVD, supple, symmetrical, trachea midline, and thyroid not enlarged, symmetric, no tenderness/mass/nodules Lungs: clear to auscultation bilaterally Heart: regular rate and rhythm, S1, S2 normal, no murmur, click, rub or gallop Extremities: extremities normal, atraumatic, no cyanosis or edema Pulses: 2+ and symmetric Skin: Skin color, texture, turgor normal. No rashes or lesions Neurologic: Grossly normal  EKG EKG Interpretation Date/Time:  Wednesday November 04 2023 10:36:59 EST Ventricular Rate:  87 PR Interval:  144 QRS Duration:  72 QT Interval:  344 QTC Calculation: 413 R Axis:   2  Text Interpretation: Normal sinus rhythm Nonspecific ST and T wave abnormality When compared with ECG of 14-Sep-2015 08:59, T wave inversion now evident in Anterior leads Confirmed by Nanetta Batty [phone]) on 11/04/2023 10:47:53 AM    ASSESSMENT AND PLAN:   PAC (premature atrial contraction) History of PACs PVCs and nonsustained VT on a 2-week event monitor which are primarily asymptomatic.  She was put on low-dose beta-blocker.  OSA (obstructive sleep apnea) On CPAP  Family history of heart disease Brother died of myocardial infarction, sister has long QT syndrome and an ICD.     Runell Gess MD FACP,FACC,FAHA, [facility] 11/04/2023 11:00 AM

## 2023-11-04 NOTE — Assessment & Plan Note (Signed)
On CPAP. ?

## 2023-11-04 NOTE — Assessment & Plan Note (Signed)
Brother died of myocardial infarction, sister has long QT syndrome and an ICD.

## 2023-12-06 ENCOUNTER — Other Ambulatory Visit: Payer: Self-pay | Admitting: Internal Medicine

## 2023-12-07 ENCOUNTER — Emergency Department (HOSPITAL_COMMUNITY): Admitting: Anesthesiology

## 2023-12-07 ENCOUNTER — Encounter (HOSPITAL_COMMUNITY)
Admission: EM | Disposition: A | Discharge status: Home / Self Care | Payer: Self-pay | Source: Home / Self Care | Attending: Internal Medicine

## 2023-12-07 ENCOUNTER — Emergency Department (HOSPITAL_COMMUNITY)

## 2023-12-07 ENCOUNTER — Encounter (HOSPITAL_COMMUNITY): Payer: Self-pay

## 2023-12-07 ENCOUNTER — Inpatient Hospital Stay (HOSPITAL_COMMUNITY)
Admission: EM | Admit: 2023-12-07 | Discharge: 2023-12-11 | DRG: 907 | Disposition: A | Discharge status: Home / Self Care | Attending: Internal Medicine | Admitting: Internal Medicine

## 2023-12-07 ENCOUNTER — Other Ambulatory Visit: Payer: Self-pay

## 2023-12-07 DIAGNOSIS — I739 Peripheral vascular disease, unspecified: Secondary | ICD-10-CM | POA: Diagnosis present

## 2023-12-07 DIAGNOSIS — K649 Unspecified hemorrhoids: Secondary | ICD-10-CM | POA: Diagnosis not present

## 2023-12-07 DIAGNOSIS — E669 Obesity, unspecified: Secondary | ICD-10-CM | POA: Diagnosis present

## 2023-12-07 DIAGNOSIS — Z96651 Presence of right artificial knee joint: Secondary | ICD-10-CM | POA: Diagnosis present

## 2023-12-07 DIAGNOSIS — Z6828 Body mass index (BMI) 28.0-28.9, adult: Secondary | ICD-10-CM | POA: Diagnosis not present

## 2023-12-07 DIAGNOSIS — K631 Perforation of intestine (nontraumatic): Principal | ICD-10-CM | POA: Diagnosis present

## 2023-12-07 DIAGNOSIS — Z860101 Personal history of adenomatous and serrated colon polyps: Secondary | ICD-10-CM | POA: Diagnosis not present

## 2023-12-07 DIAGNOSIS — K6389 Other specified diseases of intestine: Secondary | ICD-10-CM | POA: Diagnosis not present

## 2023-12-07 DIAGNOSIS — Z87891 Personal history of nicotine dependence: Secondary | ICD-10-CM

## 2023-12-07 DIAGNOSIS — Z83438 Family history of other disorder of lipoprotein metabolism and other lipidemia: Secondary | ICD-10-CM

## 2023-12-07 DIAGNOSIS — G4733 Obstructive sleep apnea (adult) (pediatric): Secondary | ICD-10-CM | POA: Diagnosis not present

## 2023-12-07 DIAGNOSIS — K9171 Accidental puncture and laceration of a digestive system organ or structure during a digestive system procedure: Secondary | ICD-10-CM | POA: Diagnosis not present

## 2023-12-07 DIAGNOSIS — N281 Cyst of kidney, acquired: Secondary | ICD-10-CM | POA: Diagnosis not present

## 2023-12-07 DIAGNOSIS — Z79899 Other long term (current) drug therapy: Secondary | ICD-10-CM

## 2023-12-07 DIAGNOSIS — K219 Gastro-esophageal reflux disease without esophagitis: Secondary | ICD-10-CM | POA: Diagnosis present

## 2023-12-07 DIAGNOSIS — M419 Scoliosis, unspecified: Secondary | ICD-10-CM | POA: Diagnosis not present

## 2023-12-07 DIAGNOSIS — K449 Diaphragmatic hernia without obstruction or gangrene: Secondary | ICD-10-CM | POA: Diagnosis not present

## 2023-12-07 DIAGNOSIS — Z885 Allergy status to narcotic agent status: Secondary | ICD-10-CM | POA: Diagnosis not present

## 2023-12-07 DIAGNOSIS — E876 Hypokalemia: Secondary | ICD-10-CM | POA: Diagnosis not present

## 2023-12-07 DIAGNOSIS — E785 Hyperlipidemia, unspecified: Secondary | ICD-10-CM | POA: Diagnosis present

## 2023-12-07 DIAGNOSIS — K5730 Diverticulosis of large intestine without perforation or abscess without bleeding: Secondary | ICD-10-CM | POA: Diagnosis not present

## 2023-12-07 DIAGNOSIS — Z8249 Family history of ischemic heart disease and other diseases of the circulatory system: Secondary | ICD-10-CM | POA: Diagnosis not present

## 2023-12-07 DIAGNOSIS — Z888 Allergy status to other drugs, medicaments and biological substances status: Secondary | ICD-10-CM

## 2023-12-07 DIAGNOSIS — K659 Peritonitis, unspecified: Secondary | ICD-10-CM | POA: Diagnosis not present

## 2023-12-07 HISTORY — PX: LAPAROTOMY: SHX154

## 2023-12-07 LAB — CBC WITH DIFFERENTIAL/PLATELET
Abs Immature Granulocytes: 0.02 10*3/uL (ref 0.00–0.07)
Basophils Absolute: 0 10*3/uL (ref 0.0–0.1)
Basophils Relative: 1 %
Eosinophils Absolute: 0.1 10*3/uL (ref 0.0–0.5)
Eosinophils Relative: 4 %
HCT: 41.7 % (ref 36.0–46.0)
Hemoglobin: 13.2 g/dL (ref 12.0–15.0)
Immature Granulocytes: 1 %
Lymphocytes Relative: 43 %
Lymphs Abs: 1.6 10*3/uL (ref 0.7–4.0)
MCH: 26.1 pg (ref 26.0–34.0)
MCHC: 31.7 g/dL (ref 30.0–36.0)
MCV: 82.4 fL (ref 80.0–100.0)
Monocytes Absolute: 0.2 10*3/uL (ref 0.1–1.0)
Monocytes Relative: 7 %
Neutro Abs: 1.7 10*3/uL (ref 1.7–7.7)
Neutrophils Relative %: 44 %
Platelets: 215 10*3/uL (ref 150–400)
RBC: 5.06 MIL/uL (ref 3.87–5.11)
RDW: 15.6 % — ABNORMAL HIGH (ref 11.5–15.5)
WBC: 3.7 10*3/uL — ABNORMAL LOW (ref 4.0–10.5)
nRBC: 0 % (ref 0.0–0.2)

## 2023-12-07 LAB — COMPREHENSIVE METABOLIC PANEL
ALT: 17 U/L (ref 0–44)
AST: 24 U/L (ref 15–41)
Albumin: 3.9 g/dL (ref 3.5–5.0)
Alkaline Phosphatase: 54 U/L (ref 38–126)
Anion gap: 14 (ref 5–15)
BUN: 7 mg/dL — ABNORMAL LOW (ref 8–23)
CO2: 23 mmol/L (ref 22–32)
Calcium: 9.1 mg/dL (ref 8.9–10.3)
Chloride: 104 mmol/L (ref 98–111)
Creatinine, Ser: 0.64 mg/dL (ref 0.44–1.00)
GFR, Estimated: >60 mL/min (ref >60)
Glucose, Bld: 90 mg/dL (ref 70–99)
Potassium: 3.4 mmol/L — ABNORMAL LOW (ref 3.5–5.1)
Sodium: 141 mmol/L (ref 135–145)
Total Bilirubin: 0.5 mg/dL (ref 0.0–1.2)
Total Protein: 6.5 g/dL (ref 6.5–8.1)

## 2023-12-07 LAB — TYPE AND SCREEN
ABO/RH(D): O NEG
Antibody Screen: NEGATIVE

## 2023-12-07 SURGERY — LAPAROTOMY, EXPLORATORY
Anesthesia: General | Site: Abdomen

## 2023-12-07 MED ORDER — CHLORHEXIDINE GLUCONATE 0.12 % MT SOLN
OROMUCOSAL | Status: AC
  Filled 2023-12-07: qty 15

## 2023-12-07 MED ORDER — PIPERACILLIN-TAZOBACTAM 3.375 G IVPB
3.3750 g | Freq: Three times a day (TID) | INTRAVENOUS | Status: DC
  Administered 2023-12-07 – 2023-12-11 (×11): 3.375 g via INTRAVENOUS
  Filled 2023-12-07 (×11): qty 50

## 2023-12-07 MED ORDER — SUGAMMADEX SODIUM 200 MG/2ML IV SOLN
INTRAVENOUS | Status: DC | PRN
Start: 2023-12-07 — End: 2023-12-07
  Administered 2023-12-07: 200 mg via INTRAVENOUS

## 2023-12-07 MED ORDER — SODIUM CHLORIDE 0.9 % IV SOLN: INTRAVENOUS | Status: DC | PRN

## 2023-12-07 MED ORDER — SUGAMMADEX SODIUM 200 MG/2ML IV SOLN
INTRAVENOUS | Status: AC
  Filled 2023-12-07: qty 2

## 2023-12-07 MED ORDER — SODIUM CHLORIDE 0.9 % IV SOLN: INTRAVENOUS | Status: DC

## 2023-12-07 MED ORDER — ROCURONIUM BROMIDE 10 MG/ML (PF) SYRINGE
PREFILLED_SYRINGE | INTRAVENOUS | Status: DC | PRN
  Administered 2023-12-07: 5 mg via INTRAVENOUS
  Administered 2023-12-07: 50 mg via INTRAVENOUS

## 2023-12-07 MED ORDER — OXYCODONE HCL 5 MG PO TABS
5.0000 mg | ORAL_TABLET | ORAL | Status: DC | PRN
  Administered 2023-12-08 – 2023-12-09 (×2): 5 mg via ORAL
  Filled 2023-12-07 (×2): qty 1

## 2023-12-07 MED ORDER — ONDANSETRON HCL 4 MG/2ML IJ SOLN
INTRAMUSCULAR | Status: AC
  Filled 2023-12-07: qty 2

## 2023-12-07 MED ORDER — PHENYLEPHRINE 80 MCG/ML (10ML) SYRINGE FOR IV PUSH (FOR BLOOD PRESSURE SUPPORT)
PREFILLED_SYRINGE | INTRAVENOUS | Status: DC | PRN
  Administered 2023-12-07: 80 ug via INTRAVENOUS
  Administered 2023-12-07: 160 ug via INTRAVENOUS

## 2023-12-07 MED ORDER — DIPHENHYDRAMINE HCL 12.5 MG/5ML PO ELIX: 12.5000 mg | ORAL_SOLUTION | Freq: Four times a day (QID) | ORAL | Status: DC | PRN

## 2023-12-07 MED ORDER — DEXTROSE-SODIUM CHLORIDE 5-0.45 % IV SOLN: INTRAVENOUS | Status: AC

## 2023-12-07 MED ORDER — FENTANYL CITRATE (PF) 250 MCG/5ML IJ SOLN
INTRAMUSCULAR | Status: AC
Start: 2023-12-07 — End: ?
  Filled 2023-12-07: qty 5

## 2023-12-07 MED ORDER — ONDANSETRON HCL 4 MG PO TABS
4.0000 mg | ORAL_TABLET | Freq: Four times a day (QID) | ORAL | Status: DC | PRN
  Administered 2023-12-08 – 2023-12-09 (×2): 4 mg via ORAL
  Filled 2023-12-07 (×2): qty 1

## 2023-12-07 MED ORDER — MIDAZOLAM HCL 2 MG/2ML IJ SOLN
INTRAMUSCULAR | Status: DC | PRN
  Administered 2023-12-07: 2 mg via INTRAVENOUS

## 2023-12-07 MED ORDER — CHLORHEXIDINE GLUCONATE 0.12 % MT SOLN: 15.0000 mL | Freq: Once | OROMUCOSAL | Status: AC

## 2023-12-07 MED ORDER — ACETAMINOPHEN 500 MG PO TABS
1000.0000 mg | ORAL_TABLET | Freq: Three times a day (TID) | ORAL | Status: DC
  Administered 2023-12-07 – 2023-12-11 (×11): 1000 mg via ORAL
  Filled 2023-12-07 (×11): qty 2

## 2023-12-07 MED ORDER — LORAZEPAM 2 MG/ML IJ SOLN
1.0000 mg | Freq: Once | INTRAMUSCULAR | Status: AC
  Administered 2023-12-07: 1 mg via INTRAVENOUS
  Filled 2023-12-07: qty 1

## 2023-12-07 MED ORDER — DEXAMETHASONE SODIUM PHOSPHATE 10 MG/ML IJ SOLN
INTRAMUSCULAR | Status: DC | PRN
  Administered 2023-12-07: 10 mg via INTRAVENOUS

## 2023-12-07 MED ORDER — DEXAMETHASONE SODIUM PHOSPHATE 10 MG/ML IJ SOLN
INTRAMUSCULAR | Status: AC
  Filled 2023-12-07: qty 1

## 2023-12-07 MED ORDER — PROPOFOL 10 MG/ML IV BOLUS
INTRAVENOUS | Status: DC | PRN
  Administered 2023-12-07: 100 mg via INTRAVENOUS
  Administered 2023-12-07: 20 mg via INTRAVENOUS

## 2023-12-07 MED ORDER — PROPOFOL 10 MG/ML IV BOLUS
INTRAVENOUS | Status: AC
  Filled 2023-12-07: qty 20

## 2023-12-07 MED ORDER — PIPERACILLIN-TAZOBACTAM 3.375 G IVPB 30 MIN
3.3750 g | Freq: Once | INTRAVENOUS | Status: AC
  Administered 2023-12-07: 3.375 g via INTRAVENOUS
  Filled 2023-12-07: qty 50

## 2023-12-07 MED ORDER — HYDROMORPHONE HCL 1 MG/ML IJ SOLN: 0.2500 mg | INTRAMUSCULAR | Status: DC | PRN

## 2023-12-07 MED ORDER — PHENYLEPHRINE HCL-NACL 20-0.9 MG/250ML-% IV SOLN
INTRAVENOUS | Status: DC | PRN
  Administered 2023-12-07: 20 ug/min via INTRAVENOUS

## 2023-12-07 MED ORDER — SODIUM CHLORIDE 0.9 % IV BOLUS
1000.0000 mL | Freq: Once | INTRAVENOUS | Status: AC
  Administered 2023-12-07: 1000 mL via INTRAVENOUS

## 2023-12-07 MED ORDER — FENTANYL CITRATE (PF) 250 MCG/5ML IJ SOLN
INTRAMUSCULAR | Status: DC | PRN
  Administered 2023-12-07 (×2): 50 ug via INTRAVENOUS
  Administered 2023-12-07: 100 ug via INTRAVENOUS
  Administered 2023-12-07: 50 ug via INTRAVENOUS

## 2023-12-07 MED ORDER — LACTATED RINGERS IV SOLN
INTRAVENOUS | Status: DC
Start: 2023-12-07 — End: 2023-12-07

## 2023-12-07 MED ORDER — ORAL CARE MOUTH RINSE: 15.0000 mL | Freq: Once | OROMUCOSAL | Status: AC

## 2023-12-07 MED ORDER — PHENYLEPHRINE 80 MCG/ML (10ML) SYRINGE FOR IV PUSH (FOR BLOOD PRESSURE SUPPORT)
PREFILLED_SYRINGE | INTRAVENOUS | Status: AC
  Filled 2023-12-07: qty 10

## 2023-12-07 MED ORDER — ACETAMINOPHEN 10 MG/ML IV SOLN
1000.0000 mg | Freq: Once | INTRAVENOUS | Status: AC
  Administered 2023-12-07: 1000 mg via INTRAVENOUS
  Filled 2023-12-07: qty 100

## 2023-12-07 MED ORDER — CHLORHEXIDINE GLUCONATE 0.12 % MT SOLN
OROMUCOSAL | Status: AC
  Administered 2023-12-07: 15 mL via OROMUCOSAL
  Filled 2023-12-07: qty 15

## 2023-12-07 MED ORDER — ONDANSETRON HCL 4 MG/2ML IJ SOLN
4.0000 mg | Freq: Once | INTRAMUSCULAR | Status: AC
  Administered 2023-12-07: 4 mg via INTRAVENOUS
  Filled 2023-12-07: qty 2

## 2023-12-07 MED ORDER — ONDANSETRON HCL 4 MG/2ML IJ SOLN
4.0000 mg | Freq: Four times a day (QID) | INTRAMUSCULAR | Status: DC | PRN
  Administered 2023-12-08: 4 mg via INTRAVENOUS
  Filled 2023-12-07: qty 2

## 2023-12-07 MED ORDER — PROPOFOL 500 MG/50ML IV EMUL
INTRAVENOUS | Status: DC | PRN
  Administered 2023-12-07: 100 ug/kg/min via INTRAVENOUS

## 2023-12-07 MED ORDER — IOHEXOL 350 MG/ML SOLN
75.0000 mL | Freq: Once | INTRAVENOUS | Status: AC | PRN
Start: 2023-12-07 — End: 2023-12-07
  Administered 2023-12-07: 75 mL via INTRAVENOUS

## 2023-12-07 MED ORDER — 0.9 % SODIUM CHLORIDE (POUR BTL) OPTIME
TOPICAL | Status: DC | PRN
  Administered 2023-12-07: 2000 mL

## 2023-12-07 MED ORDER — PIPERACILLIN-TAZOBACTAM 3.375 G IVPB: 3.3750 g | Freq: Three times a day (TID) | INTRAVENOUS | Status: DC

## 2023-12-07 MED ORDER — DIPHENHYDRAMINE HCL 50 MG/ML IJ SOLN: 12.5000 mg | Freq: Four times a day (QID) | INTRAMUSCULAR | Status: DC | PRN

## 2023-12-07 MED ORDER — ENOXAPARIN SODIUM 40 MG/0.4ML IJ SOSY
40.0000 mg | PREFILLED_SYRINGE | INTRAMUSCULAR | Status: DC
  Administered 2023-12-08 – 2023-12-11 (×4): 40 mg via SUBCUTANEOUS
  Filled 2023-12-07 (×4): qty 0.4

## 2023-12-07 MED ORDER — ONDANSETRON HCL 4 MG/2ML IJ SOLN
INTRAMUSCULAR | Status: DC | PRN
  Administered 2023-12-07: 4 mg via INTRAVENOUS

## 2023-12-07 MED ORDER — LIDOCAINE 2% (20 MG/ML) 5 ML SYRINGE
INTRAMUSCULAR | Status: DC | PRN
  Administered 2023-12-07: 60 mg via INTRAVENOUS

## 2023-12-07 MED ORDER — DROPERIDOL 2.5 MG/ML IJ SOLN: 0.6250 mg | Freq: Once | INTRAMUSCULAR | Status: DC | PRN

## 2023-12-07 MED ORDER — HYDROMORPHONE HCL 1 MG/ML IJ SOLN
0.5000 mg | INTRAMUSCULAR | Status: DC | PRN
  Administered 2023-12-08: 0.5 mg via INTRAVENOUS
  Filled 2023-12-07: qty 0.5

## 2023-12-07 MED ORDER — MIDAZOLAM HCL 2 MG/2ML IJ SOLN
INTRAMUSCULAR | Status: AC
  Filled 2023-12-07: qty 2

## 2023-12-07 MED ORDER — HYDROMORPHONE HCL 1 MG/ML IJ SOLN
1.0000 mg | Freq: Once | INTRAMUSCULAR | Status: AC
  Administered 2023-12-07: 1 mg via INTRAVENOUS
  Filled 2023-12-07: qty 1

## 2023-12-07 SURGICAL SUPPLY — 52 items
BAG COUNTER SPONGE SURGICOUNT (BAG) ×1 IMPLANT
BLADE CLIPPER SURG (BLADE) IMPLANT
CANISTER SUCT 3000ML PPV (MISCELLANEOUS) ×1 IMPLANT
CHLORAPREP W/TINT 26 (MISCELLANEOUS) ×1 IMPLANT
COUNTER NDL 20CT MAGNET RED (NEEDLE) IMPLANT
COVER MAYO STAND STRL (DRAPES) IMPLANT
COVER SURGICAL LIGHT HANDLE (MISCELLANEOUS) ×1 IMPLANT
DERMABOND ADVANCED .7 DNX12 (GAUZE/BANDAGES/DRESSINGS) ×2 IMPLANT
DRAIN CHANNEL 19F RND (DRAIN) IMPLANT
DRAPE INCISE IOBAN 66X45 STRL (DRAPES) ×1 IMPLANT
DRAPE LAPAROSCOPIC ABDOMINAL (DRAPES) ×1 IMPLANT
DRAPE WARM FLUID 44X44 (DRAPES) ×1 IMPLANT
DRSG OPSITE POSTOP 4X10 (GAUZE/BANDAGES/DRESSINGS) IMPLANT
DRSG OPSITE POSTOP 4X8 (GAUZE/BANDAGES/DRESSINGS) IMPLANT
DRSG TEGADERM 4X4.75 (GAUZE/BANDAGES/DRESSINGS) IMPLANT
ELECT BLADE 6.5 EXT (BLADE) IMPLANT
ELECT CAUTERY BLADE 6.4 (BLADE) ×1 IMPLANT
ELECT REM PT RETURN 9FT ADLT (ELECTROSURGICAL) ×1 IMPLANT
ELECTRODE REM PT RTRN 9FT ADLT (ELECTROSURGICAL) ×1 IMPLANT
EVACUATOR SILICONE 100CC (DRAIN) IMPLANT
GAUZE SPONGE 2X2 STRL 8-PLY (GAUZE/BANDAGES/DRESSINGS) IMPLANT
GLOVE BIOGEL PI IND STRL 6 (GLOVE) ×1 IMPLANT
GLOVE BIOGEL PI MICRO STRL 5.5 (GLOVE) ×1 IMPLANT
GOWN STRL REUS W/ TWL LRG LVL3 (GOWN DISPOSABLE) ×2 IMPLANT
HANDLE SUCTION POOLE (INSTRUMENTS) ×1 IMPLANT
KIT BASIN OR (CUSTOM PROCEDURE TRAY) ×1 IMPLANT
KIT TURNOVER KIT B (KITS) ×1 IMPLANT
LIGASURE IMPACT 36 18CM CVD LR (INSTRUMENTS) IMPLANT
NS IRRIG 1000ML POUR BTL (IV SOLUTION) ×2 IMPLANT
PACK GENERAL/GYN (CUSTOM PROCEDURE TRAY) ×1 IMPLANT
PAD ARMBOARD 7.5X6 YLW CONV (MISCELLANEOUS) ×1 IMPLANT
PENCIL SMOKE EVACUATOR (MISCELLANEOUS) ×1 IMPLANT
RTRCTR C-SECT PINK 25CM LRG (MISCELLANEOUS) IMPLANT
SLEEVE SUCTION CATH 165 (SLEEVE) ×1 IMPLANT
SPECIMEN JAR LARGE (MISCELLANEOUS) IMPLANT
SPONGE T-LAP 18X18 ~~LOC~~+RFID (SPONGE) IMPLANT
STAPLER SKIN PROX WIDE 3.9 (STAPLE) IMPLANT
STAPLER VISISTAT 35W (STAPLE) IMPLANT
SUCTION POOLE HANDLE (INSTRUMENTS) ×1 IMPLANT
SUT ETHILON 2 0 FS 18 (SUTURE) IMPLANT
SUT MNCRL AB 4-0 PS2 18 (SUTURE) ×2 IMPLANT
SUT PDS AB 1 TP1 96 (SUTURE) ×2 IMPLANT
SUT SILK 2 0 SH CR/8 (SUTURE) ×1 IMPLANT
SUT SILK 2 0 TIES 10X30 (SUTURE) ×1 IMPLANT
SUT SILK 3 0 SH CR/8 (SUTURE) ×1 IMPLANT
SUT SILK 3 0 TIES 10X30 (SUTURE) ×1 IMPLANT
SUT VIC AB 3-0 SH 18 (SUTURE) IMPLANT
SUT VIC AB 3-0 SH 27XBRD (SUTURE) ×2 IMPLANT
TOWEL GREEN STERILE (TOWEL DISPOSABLE) ×1 IMPLANT
TRAY FOLEY MTR SLVR 16FR STAT (SET/KITS/TRAYS/PACK) IMPLANT
TUBE CONNECTING 12X1/4 (SUCTIONS) IMPLANT
YANKAUER SUCT BULB TIP NO VENT (SUCTIONS) IMPLANT

## 2023-12-07 NOTE — Consult Note (Addendum)
 [facility] Gastroenterology Consult  Referring Provider: No ref. provider found Primary Care Physician:  Sigmund Hazel, MD Primary Gastroenterologist: [facility]-Dr. Marca Ancona  Reason for Consultation: Colonic perforation  SUBJECTIVE:   HPI: Meghan Avila is a 73 y.o. female with past medical history significant for personal history of colon polyps, gastroesophageal reflux disease, peripheral vascular disease, osteoporosis.  Resented to [facility] end[facility] on 12/07/2023 for planned colonoscopy given personal history of colon polyps.  Last colonoscopy 08/10/2020 for personal history of colon polyps (Dr. Marca Ancona) showed 10 mm tubular adenoma in descending colon, tubular adenoma in hepatic flexure, tubular adenoma in ascending colon, tubular adenoma in cecum, normal-appearing terminal ileum, multiple diverticula in sigmoid and descending and transverse and hepatic flexure, localized area of moderately scarred mucosa in rectum, nonbleeding internal hemorrhoids, medium sized transverse colon lipoma.  Given number of colon polyps, she was recommended to have repeat colonoscopy in 3 years.  Patient noted that she tolerated her bowel preparation well.  She is not on anticoagulants.  Flexible sigmoidoscopy today reported "as the scope was advanced into the rectosigmoid with gentle pressure, colonic perforation was noted at 15 cm".  6 endoclips were placed to close the defect, 2 cc of tattoo used to mark spot of luminal closure with endoclips.  Patient was subsequently sent to hospital for further evaluation and management.        Patient noted that she did not have abdominal discomfort until she presented to the emergency department.  She began to have diffuse, 15/10 intensity abdominal pain which has improved to 7/10 intensity with use of Dilaudid.  She denied any current nausea or vomiting.  No chest pain or shortness of breath.  She does feel abdominal distention.  She reported having a an unspecified  rectal procedure/surgery in [place] for internal/external hemorrhoids, subsequent to procedure she has had fecal incontinence.  Past Medical History:  Diagnosis Date   Arthritis    Family history of adverse reaction to anesthesia    post nausea and vomitting   GERD (gastroesophageal reflux disease)    hx of   H/O wheezing    occ.   Hand swelling    related to lymph nodes    Headache    migraines   History of hiatal hernia    Leg swelling    left leg -edema knee level to ankle.   Osteoporosis    Peripheral vascular disease (HCC)    left leg    PONV (postoperative nausea and vomiting)    Scoliosis    Vertigo    Past Surgical History:  Procedure Laterality Date   ANAL RECTAL MANOMETRY N/A 08/03/2019   Procedure: ANO RECTAL MANOMETRY;  Surgeon: Romie Levee, MD;  Location: [facility];  Service: Endoscopy;  Laterality: N/A;   BACK SURGERY     scoliosis surgery 1970 and 1972   BREAST REDUCTION SURGERY  1997   CHOLECYSTECTOMY  1992   laparoscopic   COLONOSCOPY WITH PROPOFOL N/A 04/19/2015   Procedure: COLONOSCOPY WITH PROPOFOL;  Surgeon: Charolett Bumpers, MD;  [facility];  Service: Endoscopy;  Laterality: N/A;   DEEP AXILLARY SENTINEL NODE BIOPSY / EXCISION     x2   ESOPHAGEAL MANOMETRY N/A 05/04/2015   Procedure: ESOPHAGEAL MANOMETRY (EM);  Surgeon: Charolett Bumpers, MD;  [facility];  Service: Endoscopy;  Laterality: N/A;   ESOPHAGOGASTRODUODENOSCOPY (EGD) WITH PROPOFOL N/A 04/19/2015   Procedure: ESOPHAGOGASTRODUODENOSCOPY (EGD) WITH PROPOFOL;  Surgeon: Charolett Bumpers, MD;  [facility];  Service: Endoscopy;  Laterality: N/A;   HEMORROIDECTOMY  2002, 2005   hiatal hernia surgery      JOINT REPLACEMENT     total right knee Dr. Thomasena Edis 02-26-18   LASER ABLATION  2008   left leg    left knee surgery - torn meniscus (left)     LIPOMA EXCISION N/A 09/21/2015   Procedure: EXCISION 6CM BACK LIPOMA;  Surgeon: Axel Filler, MD;  Location:  [facility];  Service: General;  Laterality: N/A;   MENISCUS REPAIR     right   REDUCTION MAMMAPLASTY Bilateral 1998   right shoulder rotator cuff surgery      ROBOTIC ASSISTED LAPAROSCOPIC SACROCOLPOPEXY N/A 05/30/2021   Procedure: XI ROBOTIC ASSISTED LAPAROSCOPIC SACROCOLPOPEXY;  Surgeon: Crist Fat, MD;  Location: [facility];  Service: Urology;  Laterality: N/A;   scoliosis  1970, 1972   no retained hardware   TOTAL KNEE ARTHROPLASTY Right 02/26/2018   Procedure: RIGHT TOTAL KNEE ARTHROPLASTY;  Surgeon: Eugenia Mcalpine, MD;  [facility]: [facility];  Service: Orthopedics;  Laterality: Right;   Prior to Admission medications   Medication Sig Start Date End Date Taking? Authorizing Provider  amitriptyline (ELAVIL) 10 MG tablet TAKE 1 TABLET BY MOUTH EVERYDAY AT BEDTIME 09/08/23   Huston Foley, MD  ascorbic acid (VITAMIN C) 250 MG tablet Take 250 mg by mouth daily.    [provider]  aspirin-acetaminophen-caffeine (EXCEDRIN MIGRAINE) [phone] MG tablet Take 1 tablet by mouth as needed.    [provider]  atorvastatin (LIPITOR) 10 MG tablet Take 10 mg by mouth in the morning.    [provider]  Calcium Carb-Cholecalciferol 600-20 MG-MCG TABS Take 2 tablets by mouth daily at 12 noon.    [provider]  denosumab (PROLIA) 60 MG/ML SOSY injection USE AS DIRECTED SUBCUTANEOUS EVERY 6 MONTHS AND 1 DAY    [provider]  diclofenac sodium (VOLTAREN) 1 % GEL Apply 1 application topically 2 (two) times daily as needed (KNEE PAIN).    [provider]  metoprolol tartrate (LOPRESSOR) 25 MG tablet Take 1 tablet (25 mg total) by mouth 2 (two) times daily. 09/09/23   Mallipeddi, Orion Modest, MD   Current Facility-Administered Medications  Medication Dose Route Frequency Provider Last Rate Last Admin   acetaminophen (OFIRMEV) IV 1,000 mg  1,000 mg Intravenous Once Lonell Grandchild, MD       Current Outpatient Medications  Medication Sig Dispense Refill    amitriptyline (ELAVIL) 10 MG tablet TAKE 1 TABLET BY MOUTH EVERYDAY AT BEDTIME 90 tablet 1   ascorbic acid (VITAMIN C) 250 MG tablet Take 250 mg by mouth daily.     aspirin-acetaminophen-caffeine (EXCEDRIN MIGRAINE) 250-250-65 MG tablet Take 1 tablet by mouth as needed.     atorvastatin (LIPITOR) 10 MG tablet Take 10 mg by mouth in the morning.     Calcium Carb-Cholecalciferol 600-20 MG-MCG TABS Take 2 tablets by mouth daily at 12 noon.     denosumab (PROLIA) 60 MG/ML SOSY injection USE AS DIRECTED SUBCUTANEOUS EVERY 6 MONTHS AND 1 DAY     diclofenac sodium (VOLTAREN) 1 % GEL Apply 1 application topically 2 (two) times daily as needed (KNEE PAIN).     metoprolol tartrate (LOPRESSOR) 25 MG tablet Take 1 tablet (25 mg total) by mouth 2 (two) times daily. 60 tablet 3   Allergies as of 12/07/2023 - Review Complete 12/07/2023  Allergen Reaction Noted   Zantac [ranitidine hcl] Anaphylaxis 02/05/2015   Alendronate  11/04/2023   Morphine and codeine Nausea  And Vomiting 02/05/2015   Prednisone Diarrhea and Nausea And Vomiting 08/11/2011   Ranitidine Swelling 11/04/2023   Tramadol Nausea And Vomiting 02/05/2015   Family History  Problem Relation Age of Onset   Varicose Veins Mother    Rheum arthritis Father    Cancer Sister    Heart disease Sister        before age 54   Hypertension Sister    Varicose Veins Sister    Heart attack Sister    Rheum arthritis Sister    Allergies Sister    Hyperlipidemia Brother    Sleep apnea Brother    Breast cancer Neg Hx    Social History   Socioeconomic History   Marital status: Widowed    Spouse name: Not on file   Number of children: Not on file   Years of education: Not on file   Highest education level: Not on file  Occupational History   Occupation: Payroll Admin   Tobacco Use   Smoking status: Former    Current packs/day: 0.00    Average packs/day: 1 pack/day for 5.0 years (5.0 ttl pk-yrs)    Types: Cigarettes    Start date: 10/06/1973     Quit date: 10/06/1978    Years since quitting: 45.2   Smokeless tobacco: Never  Vaping Use   Vaping status: Never Used  Substance and Sexual Activity   Alcohol use: Yes    Comment: occ beer or glass of wine   Drug use: No   Sexual activity: Yes  Other Topics Concern   Not on file  Social History Narrative   Lives at home alone   Right handed   Caffeine: 1-2 cups/day   Social Drivers of Corporate investment banker Strain: Not on file  Food Insecurity: Not on file  Transportation Needs: Not on file  Physical Activity: Not on file  Stress: Not on file  Social Connections: Not on file  Intimate Partner Violence: Not on file   Review of Systems:  Review of Systems  Respiratory:  Negative for shortness of breath.   Cardiovascular:  Negative for chest pain.  Gastrointestinal:  Positive for abdominal pain. Negative for nausea and vomiting.    OBJECTIVE:   Temp:  [97.8 F (36.6 C)-98.4 F (36.9 C)] 97.8 F (36.6 C) (03/03 1513) Pulse Rate:  [68-73] 73 (03/03 1415) Resp:  [16-21] 21 (03/03 1415) BP: (137-160)/(67-87) 137/67 (03/03 1415) SpO2:  [98 %-99 %] 98 % (03/03 1415) Weight:  [65.8 kg] 65.8 kg (03/03 1242)   Physical Exam Constitutional:      General: She is not in acute distress.    Appearance: She is not ill-appearing, toxic-appearing or diaphoretic.  Cardiovascular:     Rate and Rhythm: Normal rate and regular rhythm.  Pulmonary:     Effort: No respiratory distress.     Breath sounds: Normal breath sounds.  Abdominal:     General: Bowel sounds are normal. There is no distension.     Palpations: Abdomen is soft.     Tenderness: There is abdominal tenderness (Diffuse). There is no guarding.  Musculoskeletal:     Right lower leg: No edema.     Left lower leg: No edema.  Skin:    General: Skin is warm and dry.     Coloration: Skin is not jaundiced.  Neurological:     Mental Status: She is alert.     Labs: Recent Labs    12/07/23 1254  WBC 3.7*   HGB  13.2  HCT 41.7  PLT 215   BMET Recent Labs    12/07/23 1254  NA 141  K 3.4*  CL 104  CO2 23  GLUCOSE 90  BUN 7*  CREATININE 0.64  CALCIUM 9.1   LFT Recent Labs    12/07/23 1254  PROT 6.5  ALBUMIN 3.9  AST 24  ALT 17  ALKPHOS 54  BILITOT 0.5   PT/INR No results for input(s): "LABPROT", "INR" in the last 72 hours.  Diagnostic imaging: No results found.  IMPRESSION: Colonic perforation in sigmoid colon -Outpatient diagnostic colonoscopy 12/07/2023 for personal history of colon polyps Abdominal pain secondary to above History of left-sided diverticular disease History of internal and external hemorrhoids  PLAN: -Follow up abdominal xray imaging and abdominal CT imaging -If free air on imaging, recommend General Surgery evaluation -NPO pending further workup -Pain control per ED/primary team  -[facility] will follow   LOS: 0 days   Liliane Shi, [facility] Gastroenterology

## 2023-12-07 NOTE — H&P (View-Only) (Signed)
 Reason for Consult:colonic perforation during csc Referring Physician: Dr Hulan Fess Meghan Avila is an 73 y.o. female.  HPI: 64 yof with pmh of pvd, gerd, colon polyps who was undergoing outpatient routine csc for polyps after being prepped this morning around 10 am.  Per report and discussion with GI (I have reviewed report and pictures)- scope about 15 cm noted a colonic perforation, that 6 clips were placed on to close.  2 cc used to tattoo this also.  This was scope injury not thermal. She was sent to er.  We are asked to see her. She has ab pain, mild tachycardia, she is going over for a ct scan when I see her.  States ab surgery is hh repair in past  Past Medical History:  Diagnosis Date   Arthritis    Family history of adverse reaction to anesthesia    post nausea and vomitting   GERD (gastroesophageal reflux disease)    hx of   H/O wheezing    occ.   Hand swelling    related to lymph nodes    Headache    migraines   History of hiatal hernia    Leg swelling    left leg -edema knee level to ankle.   Osteoporosis    Peripheral vascular disease (HCC)    left leg    PONV (postoperative nausea and vomiting)    Scoliosis    Vertigo     Past Surgical History:  Procedure Laterality Date   ANAL RECTAL MANOMETRY N/A 08/03/2019   Procedure: ANO RECTAL MANOMETRY;  Surgeon: Romie Levee, MD;  [facility];  Service: Endoscopy;  Laterality: N/A;   BACK SURGERY     scoliosis surgery 1970 and 1972   BREAST REDUCTION SURGERY  1997   CHOLECYSTECTOMY  1992   laparoscopic   COLONOSCOPY WITH PROPOFOL N/A 04/19/2015   Procedure: COLONOSCOPY WITH PROPOFOL;  Surgeon: Charolett Bumpers, MD;  [facility];  Service: Endoscopy;  Laterality: N/A;   DEEP AXILLARY SENTINEL NODE BIOPSY / EXCISION     x2   ESOPHAGEAL MANOMETRY N/A 05/04/2015   Procedure: ESOPHAGEAL MANOMETRY (EM);  Surgeon: Charolett Bumpers, MD;  [facility];  Service: Endoscopy;   Laterality: N/A;   ESOPHAGOGASTRODUODENOSCOPY (EGD) WITH PROPOFOL N/A 04/19/2015   Procedure: ESOPHAGOGASTRODUODENOSCOPY (EGD) WITH PROPOFOL;  Surgeon: Charolett Bumpers, MD;  [facility];  Service: Endoscopy;  Laterality: N/A;   HEMORROIDECTOMY  2002, 2005   hiatal hernia surgery      JOINT REPLACEMENT     total right knee Dr. Thomasena Edis 02-26-18   LASER ABLATION  2008   left leg    left knee surgery - torn meniscus (left)     LIPOMA EXCISION N/A 09/21/2015   Procedure: EXCISION 6CM BACK LIPOMA;  Surgeon: Axel Filler, MD;  [facility];  Service: General;  Laterality: N/A;   MENISCUS REPAIR     right   REDUCTION MAMMAPLASTY Bilateral 1998   right shoulder rotator cuff surgery      ROBOTIC ASSISTED LAPAROSCOPIC SACROCOLPOPEXY N/A 05/30/2021   Procedure: XI ROBOTIC ASSISTED LAPAROSCOPIC SACROCOLPOPEXY;  Surgeon: Crist Fat, MD;  [facility];  Service: Urology;  Laterality: N/A;   scoliosis  1970, 1972   no retained hardware   TOTAL KNEE ARTHROPLASTY Right 02/26/2018   Procedure: RIGHT TOTAL KNEE ARTHROPLASTY;  Surgeon: Eugenia Mcalpine, MD;  [facility];  Service: Orthopedics;  Laterality: Right;    Family History  Problem Relation Age  of Onset   Varicose Veins Mother    Rheum arthritis Father    Cancer Sister    Heart disease Sister        before age 3   Hypertension Sister    Varicose Veins Sister    Heart attack Sister    Rheum arthritis Sister    Allergies Sister    Hyperlipidemia Brother    Sleep apnea Brother    Breast cancer Neg Hx     Social History:  reports that she quit smoking about 45 years ago. Her smoking use included cigarettes. She started smoking about 50 years ago. She has a 5 pack-year smoking history. She has never used smokeless tobacco. She reports current alcohol use. She reports that she does not use drugs.  Allergies:  Allergies  Allergen Reactions   Zantac [Ranitidine Hcl] Anaphylaxis   Alendronate     Other  Reaction(s): burping, diarrhea   Morphine And Codeine Nausea And Vomiting    Morphine, Hydrocodone Tolerates Dilaudid, Fentanyl per Epic   Prednisone Diarrhea and Nausea And Vomiting   Ranitidine Swelling   Tramadol Nausea And Vomiting    AMS    Medications: I have reviewed the patient's current medications.  Results for orders placed or performed during the hospital encounter of 12/07/23 (from the past 48 hours)  CBC with Differential     Status: Abnormal   Collection Time: 12/07/23 12:54 PM  Result Value Ref Range   WBC 3.7 (L) 4.0 - 10.5 K/uL   RBC 5.06 3.87 - 5.11 MIL/uL   Hemoglobin 13.2 12.0 - 15.0 g/dL   HCT 16.1 09.6 - 04.5 %   MCV 82.4 80.0 - 100.0 fL   MCH 26.1 26.0 - 34.0 pg   MCHC 31.7 30.0 - 36.0 g/dL   RDW 40.9 (H) 81.1 - 91.4 %   Platelets 215 150 - 400 K/uL   nRBC 0.0 0.0 - 0.2 %   Neutrophils Relative % 44 %   Neutro Abs 1.7 1.7 - 7.7 K/uL   Lymphocytes Relative 43 %   Lymphs Abs 1.6 0.7 - 4.0 K/uL   Monocytes Relative 7 %   Monocytes Absolute 0.2 0.1 - 1.0 K/uL   Eosinophils Relative 4 %   Eosinophils Absolute 0.1 0.0 - 0.5 K/uL   Basophils Relative 1 %   Basophils Absolute 0.0 0.0 - 0.1 K/uL   Immature Granulocytes 1 %   Abs Immature Granulocytes 0.02 0.00 - 0.07 K/uL    Comment: Performed at [facility], [address], [place], [place] [address]  Comprehensive metabolic panel     Status: Abnormal   Collection Time: 12/07/23 12:54 PM  Result Value Ref Range   Sodium 141 135 - 145 mmol/L   Potassium 3.4 (L) 3.5 - 5.1 mmol/L   Chloride 104 98 - 111 mmol/L   CO2 23 22 - 32 mmol/L   Glucose, Bld 90 70 - 99 mg/dL    Comment: Glucose reference range applies only to samples taken after fasting for at least 8 hours.   BUN 7 (L) 8 - 23 mg/dL   Creatinine, Ser 6.21 0.44 - 1.00 mg/dL   Calcium 9.1 8.9 - 30.8 mg/dL   Total Protein 6.5 6.5 - 8.1 g/dL   Albumin 3.9 3.5 - 5.0 g/dL   AST 24 15 - 41 U/L   ALT 17 0 - 44 U/L   Alkaline Phosphatase 54 38  - 126 U/L   Total Bilirubin 0.5 0.0 - 1.2  mg/dL   GFR, Estimated >29 >56 mL/min    Comment: (NOTE) Calculated using the CKD-EPI Creatinine Equation (2021)    Anion gap 14 5 - 15    Comment: Performed at [facility], [address], [place], [place] [address]    No results found.  Review of Systems  Gastrointestinal:  Positive for abdominal pain.  All other systems reviewed and are negative.  Blood pressure 137/67, pulse 73, temperature 97.8 F (36.6 C), temperature source Oral, resp. rate (!) 21, height 5' (1.524 m), weight 65.8 kg, SpO2 98%. Physical Exam Vitals reviewed.  Constitutional:      Appearance: Normal appearance.  Eyes:     General: No scleral icterus.    Extraocular Movements: Extraocular movements intact.  Cardiovascular:     Rate and Rhythm: Regular rhythm. Tachycardia present.     Pulses: Normal pulses.  Pulmonary:     Effort: Pulmonary effort is normal.     Breath sounds: No wheezing.  Abdominal:     Tenderness: There is abdominal tenderness (tender diffusely).  Musculoskeletal:     Cervical back: Neck supple.  Skin:    General: Skin is warm and dry.     Capillary Refill: Capillary refill takes less than 2 seconds.  Neurological:     General: No focal deficit present.     Mental Status: She is alert.  Psychiatric:        Mood and Affect: Mood normal.        Behavior: Behavior normal.     Assessment/Plan: Colonic perforation - I think she will require laparotomy for this based on exam but will wait for ct scan as it is being done now -she also has low grade tachycardia -will come back once scan done -we discussed surgery being laparotomy with possible primary repair, primary repair with DI, resection with colostomy or anastomosis plus/minus DI. This would be intraoperative decision based on tissue, if there was reason it perforated.   -I discussed this plan with patient and we will follow up  I reviewed her labs, vitals, discussed with ER  and GI providers, reviewed xray with concern for free air  Emelia Loron 12/07/2023, 4:33 PM    Addendum: ct confirms suspicion based on exam, needs to proceed to OR urgently as discussed

## 2023-12-07 NOTE — Anesthesia Procedure Notes (Addendum)
 Procedure Name: Intubation Date/Time: 12/07/2023 6:47 PM  Performed by: Flonnie Hailstone, CRNAPre-anesthesia Checklist: Patient identified, Emergency Drugs available, Suction available and Patient being monitored Patient Re-evaluated:Patient Re-evaluated prior to induction Oxygen Delivery Method: Circle system utilized Preoxygenation: Pre-oxygenation with 100% oxygen Induction Type: IV induction Ventilation: Mask ventilation without difficulty Laryngoscope Size: Mac and 4 Grade View: Grade I Tube type: Oral Tube size: 7.0 mm Number of attempts: 1 Airway Equipment and Method: Stylet Placement Confirmation: ETT inserted through vocal cords under direct vision, positive ETCO2 and breath sounds checked- equal and bilateral Secured at: 21 cm Tube secured with: Tape Dental Injury: Teeth and Oropharynx as per pre-operative assessment  Comments: intubated by C. Zechariah Bissonnette, CRNA; ebbs

## 2023-12-07 NOTE — ED Notes (Signed)
 Patient belongings given to sister at bedside, sister is accompanying patient to the pre-op area.

## 2023-12-07 NOTE — Consult Note (Addendum)
 Reason for Consult:colonic perforation during csc Referring Physician: Dr Hulan Fess Meghan Avila is an 73 y.o. female.  HPI: 64 yof with pmh of pvd, gerd, colon polyps who was undergoing outpatient routine csc for polyps after being prepped this morning around 10 am.  Per report and discussion with GI (I have reviewed report and pictures)- scope about 15 cm noted a colonic perforation, that 6 clips were placed on to close.  2 cc used to tattoo this also.  This was scope injury not thermal. She was sent to er.  We are asked to see her. She has ab pain, mild tachycardia, she is going over for a ct scan when I see her.  States ab surgery is hh repair in past  Past Medical History:  Diagnosis Date   Arthritis    Family history of adverse reaction to anesthesia    post nausea and vomitting   GERD (gastroesophageal reflux disease)    hx of   H/O wheezing    occ.   Hand swelling    related to lymph nodes    Headache    migraines   History of hiatal hernia    Leg swelling    left leg -edema knee level to ankle.   Osteoporosis    Peripheral vascular disease (HCC)    left leg    PONV (postoperative nausea and vomiting)    Scoliosis    Vertigo     Past Surgical History:  Procedure Laterality Date   ANAL RECTAL MANOMETRY N/A 08/03/2019   Procedure: ANO RECTAL MANOMETRY;  Surgeon: Romie Levee, MD;  Location: [facility];  Service: Endoscopy;  Laterality: N/A;   BACK SURGERY     scoliosis surgery 1970 and 1972   BREAST REDUCTION SURGERY  1997   CHOLECYSTECTOMY  1992   laparoscopic   COLONOSCOPY WITH PROPOFOL N/A 04/19/2015   Procedure: COLONOSCOPY WITH PROPOFOL;  Surgeon: Charolett Bumpers, MD;  Location: [facility];  Service: Endoscopy;  Laterality: N/A;   DEEP AXILLARY SENTINEL NODE BIOPSY / EXCISION     x2   ESOPHAGEAL MANOMETRY N/A 05/04/2015   Procedure: ESOPHAGEAL MANOMETRY (EM);  Surgeon: Charolett Bumpers, MD;  Location: [facility];  Service: Endoscopy;   Laterality: N/A;   ESOPHAGOGASTRODUODENOSCOPY (EGD) WITH PROPOFOL N/A 04/19/2015   Procedure: ESOPHAGOGASTRODUODENOSCOPY (EGD) WITH PROPOFOL;  Surgeon: Charolett Bumpers, MD;  Location: [facility];  Service: Endoscopy;  Laterality: N/A;   HEMORROIDECTOMY  2002, 2005   hiatal hernia surgery      JOINT REPLACEMENT     total right knee Dr. Thomasena Edis 02-26-18   LASER ABLATION  2008   left leg    left knee surgery - torn meniscus (left)     LIPOMA EXCISION N/A 09/21/2015   Procedure: EXCISION 6CM BACK LIPOMA;  Surgeon: Axel Filler, MD;  Location: [facility];  Service: General;  Laterality: N/A;   MENISCUS REPAIR     right   REDUCTION MAMMAPLASTY Bilateral 1998   right shoulder rotator cuff surgery      ROBOTIC ASSISTED LAPAROSCOPIC SACROCOLPOPEXY N/A 05/30/2021   Procedure: XI ROBOTIC ASSISTED LAPAROSCOPIC SACROCOLPOPEXY;  Surgeon: Crist Fat, MD;  Location: [facility];  Service: Urology;  Laterality: N/A;   scoliosis  1970, 1972   no retained hardware   TOTAL KNEE ARTHROPLASTY Right 02/26/2018   Procedure: RIGHT TOTAL KNEE ARTHROPLASTY;  Surgeon: Eugenia Mcalpine, MD;  Location: [facility];  Service: Orthopedics;  Laterality: Right;    Family History  Problem Relation Age  of Onset   Varicose Veins Mother    Rheum arthritis Father    Cancer Sister    Heart disease Sister        before age 3   Hypertension Sister    Varicose Veins Sister    Heart attack Sister    Rheum arthritis Sister    Allergies Sister    Hyperlipidemia Brother    Sleep apnea Brother    Breast cancer Neg Hx     Social History:  reports that she quit smoking about 45 years ago. Her smoking use included cigarettes. She started smoking about 50 years ago. She has a 5 pack-year smoking history. She has never used smokeless tobacco. She reports current alcohol use. She reports that she does not use drugs.  Allergies:  Allergies  Allergen Reactions   Zantac [Ranitidine Hcl] Anaphylaxis   Alendronate     Other  Reaction(s): burping, diarrhea   Morphine And Codeine Nausea And Vomiting    Morphine, Hydrocodone Tolerates Dilaudid, Fentanyl per Epic   Prednisone Diarrhea and Nausea And Vomiting   Ranitidine Swelling   Tramadol Nausea And Vomiting    AMS    Medications: I have reviewed the patient's current medications.  Results for orders placed or performed during the hospital encounter of 12/07/23 (from the past 48 hours)  CBC with Differential     Status: Abnormal   Collection Time: 12/07/23 12:54 PM  Result Value Ref Range   WBC 3.7 (L) 4.0 - 10.5 K/uL   RBC 5.06 3.87 - 5.11 MIL/uL   Hemoglobin 13.2 12.0 - 15.0 g/dL   HCT 16.1 09.6 - 04.5 %   MCV 82.4 80.0 - 100.0 fL   MCH 26.1 26.0 - 34.0 pg   MCHC 31.7 30.0 - 36.0 g/dL   RDW 40.9 (H) 81.1 - 91.4 %   Platelets 215 150 - 400 K/uL   nRBC 0.0 0.0 - 0.2 %   Neutrophils Relative % 44 %   Neutro Abs 1.7 1.7 - 7.7 K/uL   Lymphocytes Relative 43 %   Lymphs Abs 1.6 0.7 - 4.0 K/uL   Monocytes Relative 7 %   Monocytes Absolute 0.2 0.1 - 1.0 K/uL   Eosinophils Relative 4 %   Eosinophils Absolute 0.1 0.0 - 0.5 K/uL   Basophils Relative 1 %   Basophils Absolute 0.0 0.0 - 0.1 K/uL   Immature Granulocytes 1 %   Abs Immature Granulocytes 0.02 0.00 - 0.07 K/uL    Comment: Performed at [facility], [address], [place], [place] [address]  Comprehensive metabolic panel     Status: Abnormal   Collection Time: 12/07/23 12:54 PM  Result Value Ref Range   Sodium 141 135 - 145 mmol/L   Potassium 3.4 (L) 3.5 - 5.1 mmol/L   Chloride 104 98 - 111 mmol/L   CO2 23 22 - 32 mmol/L   Glucose, Bld 90 70 - 99 mg/dL    Comment: Glucose reference range applies only to samples taken after fasting for at least 8 hours.   BUN 7 (L) 8 - 23 mg/dL   Creatinine, Ser 6.21 0.44 - 1.00 mg/dL   Calcium 9.1 8.9 - 30.8 mg/dL   Total Protein 6.5 6.5 - 8.1 g/dL   Albumin 3.9 3.5 - 5.0 g/dL   AST 24 15 - 41 U/L   ALT 17 0 - 44 U/L   Alkaline Phosphatase 54 38  - 126 U/L   Total Bilirubin 0.5 0.0 - 1.2  mg/dL   GFR, Estimated >29 >56 mL/min    Comment: (NOTE) Calculated using the CKD-EPI Creatinine Equation (2021)    Anion gap 14 5 - 15    Comment: Performed at [facility], [address], [place], [place] [address]    No results found.  Review of Systems  Gastrointestinal:  Positive for abdominal pain.  All other systems reviewed and are negative.  Blood pressure 137/67, pulse 73, temperature 97.8 F (36.6 C), temperature source Oral, resp. rate (!) 21, height 5' (1.524 m), weight 65.8 kg, SpO2 98%. Physical Exam Vitals reviewed.  Constitutional:      Appearance: Normal appearance.  Eyes:     General: No scleral icterus.    Extraocular Movements: Extraocular movements intact.  Cardiovascular:     Rate and Rhythm: Regular rhythm. Tachycardia present.     Pulses: Normal pulses.  Pulmonary:     Effort: Pulmonary effort is normal.     Breath sounds: No wheezing.  Abdominal:     Tenderness: There is abdominal tenderness (tender diffusely).  Musculoskeletal:     Cervical back: Neck supple.  Skin:    General: Skin is warm and dry.     Capillary Refill: Capillary refill takes less than 2 seconds.  Neurological:     General: No focal deficit present.     Mental Status: She is alert.  Psychiatric:        Mood and Affect: Mood normal.        Behavior: Behavior normal.     Assessment/Plan: Colonic perforation - I think she will require laparotomy for this based on exam but will wait for ct scan as it is being done now -she also has low grade tachycardia -will come back once scan done -we discussed surgery being laparotomy with possible primary repair, primary repair with DI, resection with colostomy or anastomosis plus/minus DI. This would be intraoperative decision based on tissue, if there was reason it perforated.   -I discussed this plan with patient and we will follow up  I reviewed her labs, vitals, discussed with ER  and GI providers, reviewed xray with concern for free air  Meghan Avila 12/07/2023, 4:33 PM    Addendum: ct confirms suspicion based on exam, needs to proceed to OR urgently as discussed

## 2023-12-07 NOTE — Subjective & Objective (Signed)
 Patient was undergoing routine colonoscopy today during procedure noted to have colonic perforation GI applied clips and patient was transferred to emergency department where she was seen emergently by general surgery taken to the OR for exploratory laparotomy

## 2023-12-07 NOTE — ED Provider Triage Note (Signed)
 Emergency Medicine Provider Triage Evaluation Note  Meghan Avila , a 73 y.o. female  was evaluated in triage.  Pt complains of colon perforation during colonoscopy today.  Patient endorses abdominal pain.  No dizziness nausea or vomiting.  Been NPO.  She is not on blood thinners.  Review of Systems  Positive:  Negative:   Physical Exam  BP (!) 160/87 (BP Location: Left Arm)   Pulse 68   Temp 98.4 F (36.9 C)   Resp 16   Ht 5' (1.524 m)   Wt 65.8 kg   SpO2 99%   BMI 28.32 kg/m  Gen:   Awake, no distress   Resp:  Normal effort  MSK:   Moves extremities without difficulty  Other:    Medical Decision Making  Medically screening exam initiated at 12:52 PM.  Appropriate orders placed.  Eric Morganti was informed that the remainder of the evaluation will be completed by another provider, this initial triage assessment does not replace that evaluation, and the importance of remaining in the ED until their evaluation is complete.     Halford Decamp, PA-C 12/07/23 1253

## 2023-12-07 NOTE — Assessment & Plan Note (Signed)
-   will replace electrolytes and repeat  check Mg, phos and Ca level and replace as needed Monitor on telemetry   Lab Results  Component Value Date   K 3.4 (L) 12/07/2023     Lab Results  Component Value Date   CREATININE 0.64 12/07/2023   No results found for: "MG" Lab Results  Component Value Date   CALCIUM 9.1 12/07/2023

## 2023-12-07 NOTE — Assessment & Plan Note (Signed)
 Follow-up as an outpatient with further imaging

## 2023-12-07 NOTE — Progress Notes (Signed)
 Admitting Hospitalist MD at bedside to assess patient for admission to [facility].

## 2023-12-07 NOTE — H&P (Signed)
 Meghan Avila ATF:[id number] DOB: [birth year] DOA: 12/07/2023     PCP: Sigmund Hazel, MD   Outpatient Specialists:   CARDS:  Dr. Nanetta Batty, MD   Patient arrived to ER on 12/07/23 at 1146 Referred by Attending No att. providers found   Patient coming from:    home Lives With family    Chief Complaint:  abd pain   HPI: Meghan Avila is a 73 y.o. female with medical history significant of HLD, OSA on CPAP, fam hx of prolonged QT syndrome, GERD, PVD,     Presented with  abd pain,  Patient was undergoing routine colonoscopy today during procedure noted to have colonic perforation GI applied clips and patient was transferred to emergency department where she was seen emergently by general surgery taken to the OR for exploratory laparotomy   Denies significant ETOH intake   Does not smoke   Lab Results  Component Value Date   SARSCOV2NAA RESULT: NEGATIVE 05/27/2021   SARSCOV2NAA Not Detected 08/12/2019   SARSCOV2NAA NOT DETECTED 07/30/2019      Regarding pertinent Chronic problems:     Hyperlipidemia  on statins Lipitor (atorvastatin)        OSA -on nocturnal CPAP     While in ER:     CT showed free air General Surgery consulted patient taken to laparotomy    Lab Orders         CBC with Differential         Comprehensive metabolic panel      KUB - Nonspecific bowel gas pattern with a few distended loops of air-filled small and large bowel. There does appear to be free air however beneath the diaphragm and recommend correlation with the separate CT scan from same day.   Underinflation of the lungs.  No consolidation or effusion.   Metallic clips in the central pelvis and right upper quadrant of the abdomen.    CTabd/pelvis -  free air,  Small slightly complex cystic lesion along the right mid kidney. Too small to completely characterize. Recommend simple follow-up CT in 6 Months   Following Medications were ordered in ER: Medications   0.9 %  sodium chloride infusion (has no administration in time range)  lactated ringers infusion ( Intravenous New Bag/Given 12/07/23 1915)  chlorhexidine (PERIDEX) 0.12 % solution (has no administration in time range)  piperacillin-tazobactam (ZOSYN) IVPB 3.375 g (0 g Intravenous Stopped 12/07/23 1541)  sodium chloride 0.9 % bolus 1,000 mL (0 mLs Intravenous Stopped 12/07/23 1519)  ondansetron (ZOFRAN) injection 4 mg (4 mg Intravenous Given 12/07/23 1416)  HYDROmorphone (DILAUDID) injection 1 mg (1 mg Intravenous Given 12/07/23 1417)  acetaminophen (OFIRMEV) IV 1,000 mg (0 mg Intravenous Stopped 12/07/23 1740)  iohexol (OMNIPAQUE) 350 MG/ML injection 75 mL (75 mLs Intravenous Contrast Given 12/07/23 1637)  LORazepam (ATIVAN) injection 1 mg (1 mg Intravenous Given 12/07/23 1722)  chlorhexidine (PERIDEX) 0.12 % solution 15 mL (15 mLs Mouth/Throat Given 12/07/23 1810)    Or  Oral care mouth rinse ( Mouth Rinse See Alternative 12/07/23 1810)    _______________________________________________________ ER Provider Called:    General surgery   Dr. Freida Busman They Recommend admit to medicine   Patient taken to OR for open Laparoscopy     ED Triage Vitals  Encounter Vitals Group     BP 12/07/23 1206 (!) 160/87     Systolic BP Percentile --      Diastolic BP Percentile --      Pulse Rate 12/07/23 1206  68     Resp 12/07/23 1206 16     Temp 12/07/23 1206 98.4 F (36.9 C)     Temp Source 12/07/23 1513 Oral     SpO2 12/07/23 1206 99 %     Weight 12/07/23 1242 145 lb (65.8 kg)     Height 12/07/23 1242 5' (1.524 m)     Head Circumference --      Peak Flow --      Pain Score 12/07/23 1242 10     Pain Loc --      Pain Education --      Exclude from Growth Chart --   ZHYQ(65)@     _________________________________________ Significant initial  Findings: Abnormal Labs Reviewed  CBC WITH DIFFERENTIAL/PLATELET - Abnormal; Notable for the following components:      Result Value   WBC 3.7 (*)    RDW 15.6 (*)     All other components within normal limits  COMPREHENSIVE METABOLIC PANEL - Abnormal; Notable for the following components:   Potassium 3.4 (*)    BUN 7 (*)    All other components within normal limits     ECG: Ordered     The recent clinical data is shown below. Vitals:   12/07/23 1415 12/07/23 1513 12/07/23 1600 12/07/23 1748  BP: 137/67   107/84  Pulse: 73  94 83  Resp: (!) 21  17 16   Temp:  97.8 F (36.6 C)  99.5 F (37.5 C)  TempSrc:  Oral  Oral  SpO2: 98%  91%   Weight:      Height:        WBC     Component Value Date/Time   WBC 3.7 (L) 12/07/2023 1254   LYMPHSABS 1.6 12/07/2023 1254   MONOABS 0.2 12/07/2023 1254   EOSABS 0.1 12/07/2023 1254   BASOSABS 0.0 12/07/2023 1254        ABX started Antibiotics Given (last 72 hours)     Date/Time Action Medication Dose Rate   12/07/23 1511 New Bag/Given   piperacillin-tazobactam (ZOSYN) IVPB 3.375 g 3.375 g 100 mL/hr         __________________________________________________________ Recent Labs  Lab 12/07/23 1254  NA 141  K 3.4*  CO2 23  GLUCOSE 90  BUN 7*  CREATININE 0.64  CALCIUM 9.1    Cr  stable,    Lab Results  Component Value Date   CREATININE 0.64 12/07/2023   CREATININE 0.70 11/12/2022   CREATININE 0.48 05/31/2021    Recent Labs  Lab 12/07/23 1254  AST 24  ALT 17  ALKPHOS 54  BILITOT 0.5  PROT 6.5  ALBUMIN 3.9   Lab Results  Component Value Date   CALCIUM 9.1 12/07/2023        Plt: Lab Results  Component Value Date   PLT 215 12/07/2023       Recent Labs  Lab 12/07/23 1254  WBC 3.7*  NEUTROABS 1.7  HGB 13.2  HCT 41.7  MCV 82.4  PLT 215    HG/HCT  stable,     Component Value Date/Time   HGB 13.2 12/07/2023 1254   HCT 41.7 12/07/2023 1254   MCV 82.4 12/07/2023 1254   ___________________________________________ Hospitalist was called for admission for colon perforation    The following Work up has been ordered so far:  Orders Placed This Encounter  Procedures    Critical Care   CT ABDOMEN PELVIS W CONTRAST   DG Abdomen Acute W/Chest   CBC with Differential  Comprehensive metabolic panel   Informed Consent Details: Physician/Practitioner Attestation; Transcribe to consent form and obtain patient signature   Vital signs Per Protocol   Notify physician (specify)   Diet Per Protocol   Pre-admission testing diagnosis   Medication Instructions Per Protocol   Discharge instructions   Void   IV Fluids Per Protocol   May use Lidocaine 1% - 2% subcutaneously prior to IV start if patient not allergic to Lidocaine   Follow Diabetes Medication Adjustment Guidelines Prior to Procedure and Surgery   Anesthesia Preoperative Order   Consult to hospitalist   Continuous pulse oximetry   Oxygen therapy Mode or (Route): Nasal cannula; Keep O2 saturation between: > 92%   ED EKG   Type and screen [facility]   Insert and maintain IV line     OTHER Significant initial  Findings:  labs showing:          Cultures:    Component Value Date/Time   SDES  05/22/2021 1035    URINE, CLEAN CATCH Performed at [facility], [address], [place], [place] [address]    SPECREQUEST  05/22/2021 1035    NONE Performed at [facility], [address], [place], [place] [address]    CULT  05/22/2021 1035    NO GROWTH Performed at [facility], [address], [place], [place] [address]    REPTSTATUS 05/23/2021 FINAL 05/22/2021 1035     Radiological Exams on Admission: CT ABDOMEN PELVIS W CONTRAST Result Date: 12/07/2023 CLINICAL DATA:  Peritonitis. EXAM: CT ABDOMEN AND PELVIS WITH CONTRAST TECHNIQUE: Multidetector CT imaging of the abdomen and pelvis was performed using the standard protocol following bolus administration of intravenous contrast. RADIATION DOSE REDUCTION: This exam was performed according to the departmental dose-optimization program which includes automated exposure control,  adjustment of the mA and/or kV according to patient size and/or use of iterative reconstruction technique. CONTRAST:  75mL OMNIPAQUE IOHEXOL 350 MG/ML SOLN COMPARISON:  CT 03/01/2020. Chest abdomen x-ray series earlier 12/07/2023 FINDINGS: Lower chest: Mild linear opacity lung bases likely scar or atelectasis. No pleural effusion. Small hiatal hernia. Hepatobiliary: Previous cholecystectomy. There is slight ectasia of the biliary tree but normal tapering of the common duct towards the pancreatic head. Slightly more pronounced than previous. Preserved hepatic parenchyma. Patent portal vein. Pancreas: Unremarkable. No pancreatic ductal dilatation or surrounding inflammatory changes. Spleen: Normal in size without focal abnormality. Adrenals/Urinary Tract: The adrenal glands are preserved. Bosniak 1 lower pole right-sided dominant renal cyst seen measuring 6.7 cm in diameter. Hounsfield unit of 10. Tiny other cystic lesions identified in each kidney. There is 1 lesion exophytic from the midportion of the right kidney which appears complex. This is a small lesion measuring 9 mm on series 3, image 28. This was a tiny focus in 2021. This is more indeterminate lesion. Recommend either six-month follow-up study or MRI as clinically appropriate. Preserved contour to the urinary bladder. Stomach/Bowel: Surgical changes along the rectum. The large bowel is nondilated. Scattered colonic diverticula identified along the descending and sigmoid colon. There is significant wall thickening along the mid sigmoid colon with severe stranding. There are some metallic clips in this location. Please correlate with provided history of recent colonoscopy. There is significant adjacent free air extending superior to this portion of the colon. Additional scattered areas of free air seen scattered throughout the mesentery and up by the level of the diaphragm as well consistent with perforation. The appendix has a normal  caliber in the right  lower quadrant. The stomach and small bowel are nondilated. Vascular/Lymphatic: No significant vascular findings are present. No enlarged abdominal or pelvic lymph nodes. Reproductive: Status post hysterectomy. No adnexal masses. Other: Small amount of free fluid in the pelvis. Again scattered free air, central mesenteric stranding. Musculoskeletal: Curvature of the lumbar spine with moderate degenerative changes. Pars defects at L5 with grade 2 anterolisthesis of L5 on S1. Multilevel disc bulging and stenosis along the spine. Critical Value/emergent results were called by telephone at the time of interpretation on 12/07/2023 at 4:54 pm to provider Dr. Wilkie Aye, who verbally acknowledged these results. IMPRESSION: Free air identified greatest surrounding the mid sigmoid colon where there is wall thickening, stranding and some clips. Please correlate for focal bowel perforation of the colon. No associated bowel obstruction. No extraluminal rim enhancing fluid collection suggested. Scattered stranding and trace simple free fluid in the pelvis. Previous cholecystectomy. Small hiatal hernia. Surgical changes along the rectum. Small slightly complex cystic lesion along the right mid kidney. Too small to completely characterize. Recommend simple follow-up CT in 6 months with without contrast. If there is more clinical concern at this time a follow up MRI could be considered when clinically appropriate. Electronically Signed   By: [name] Kays M.D.   On: 12/07/2023 16:56   DG Abdomen Acute W/Chest Result Date: 12/07/2023 CLINICAL DATA:  Bowel perforation. EXAM: DG ABDOMEN ACUTE WITH 1 VIEW CHEST COMPARISON:  Chest x-ray 09/02/2016 FINDINGS: Underinflation. No consolidation, pneumothorax or effusion. No edema. Normal cardiopericardial silhouette. Surgical clips in the right axillary region. Overlapping cardiac leads. Degenerative changes of the spine and shoulders. Metallic foci along the pelvis, possible clips. There is also  presumed cholecystectomy clips in the right upper quadrant. There is gaseous distention of loops of colon the mid abdomen. Few mildly dilated loops of small bowel left lower quadrant. There appears to be some free air beneath the diaphragm. Please see separate CT scan from same day. Moderate degenerative changes of the spine with curvature. Presumed vascular calcifications in the pelvis. IMPRESSION: Nonspecific bowel gas pattern with a few distended loops of air-filled small and large bowel. There does appear to be free air however beneath the diaphragm and recommend correlation with the separate CT scan from same day. Underinflation of the lungs.  No consolidation or effusion. Metallic clips in the central pelvis and right upper quadrant of the abdomen. Electronically Signed   By: [name] Kays M.D.   On: 12/07/2023 16:48   _______________________________________________________________________________________________________ Latest  Blood pressure 107/84, pulse 83, temperature 99.5 F (37.5 C), temperature source Oral, resp. rate 16, height 5' (1.524 m), weight 65.8 kg, SpO2 91%.   Vitals  labs and radiology finding personally reviewed  Review of Systems:    Pertinent positives include:   abdominal pain,  Constitutional:  No weight loss, night sweats, Fevers, chills, fatigue, weight loss  HEENT:  No headaches, Difficulty swallowing,Tooth/dental problems,Sore throat,  No sneezing, itching, ear ache, nasal congestion, post nasal drip,  Cardio-vascular:  No chest pain, Orthopnea, PND, anasarca, dizziness, palpitations.no Bilateral lower extremity swelling  GI:  No heartburn, indigestion, nausea, vomiting, diarrhea, change in bowel habits, loss of appetite, melena, blood in stool, hematemesis Resp:  no shortness of breath at rest. No dyspnea on exertion, No excess mucus, no productive cough, No non-productive cough, No coughing up of blood.No change in color of mucus.No wheezing. Skin:  no rash  or lesions. No jaundice GU:  no dysuria, change in color of urine, no  urgency or frequency. No straining to urinate.  No flank pain.  Musculoskeletal:  No joint pain or no joint swelling. No decreased range of motion. No back pain.  Psych:  No change in mood or affect. No depression or anxiety. No memory loss.  Neuro: no localizing neurological complaints, no tingling, no weakness, no double vision, no gait abnormality, no slurred speech, no confusion  All systems reviewed and apart from HOPI all are negative _______________________________________________________________________________________________ Past Medical History:   Past Medical History:  Diagnosis Date   Arthritis    Family history of adverse reaction to anesthesia    post nausea and vomitting   GERD (gastroesophageal reflux disease)    hx of   H/O wheezing    occ.   Hand swelling    related to lymph nodes    Headache    migraines   History of hiatal hernia    Leg swelling    left leg -edema knee level to ankle.   Osteoporosis    Peripheral vascular disease (HCC)    left leg    PONV (postoperative nausea and vomiting)    Scoliosis    Vertigo       Past Surgical History:  Procedure Laterality Date   ANAL RECTAL MANOMETRY N/A 08/03/2019   Procedure: ANO RECTAL MANOMETRY;  Surgeon: Romie Levee, MD;  Location: [facility];  Service: Endoscopy;  Laterality: N/A;   BACK SURGERY     scoliosis surgery 1970 and 1972   BREAST REDUCTION SURGERY  1997   CHOLECYSTECTOMY  1992   laparoscopic   COLONOSCOPY WITH PROPOFOL N/A 04/19/2015   Procedure: COLONOSCOPY WITH PROPOFOL;  Surgeon: Charolett Bumpers, MD;  Location: [facility];  Service: Endoscopy;  Laterality: N/A;   DEEP AXILLARY SENTINEL NODE BIOPSY / EXCISION     x2   ESOPHAGEAL MANOMETRY N/A 05/04/2015   Procedure: ESOPHAGEAL MANOMETRY (EM);  Surgeon: Charolett Bumpers, MD;  Location: [facility];  Service: Endoscopy;  Laterality: N/A;    ESOPHAGOGASTRODUODENOSCOPY (EGD) WITH PROPOFOL N/A 04/19/2015   Procedure: ESOPHAGOGASTRODUODENOSCOPY (EGD) WITH PROPOFOL;  Surgeon: Charolett Bumpers, MD;  Location: [facility];  Service: Endoscopy;  Laterality: N/A;   HEMORROIDECTOMY  2002, 2005   hiatal hernia surgery      JOINT REPLACEMENT     total right knee Dr. Thomasena Edis 02-26-18   LASER ABLATION  2008   left leg    left knee surgery - torn meniscus (left)     LIPOMA EXCISION N/A 09/21/2015   Procedure: EXCISION 6CM BACK LIPOMA;  Surgeon: Axel Filler, MD;  Location: [facility];  Service: General;  Laterality: N/A;   MENISCUS REPAIR     right   REDUCTION MAMMAPLASTY Bilateral 1998   right shoulder rotator cuff surgery      ROBOTIC ASSISTED LAPAROSCOPIC SACROCOLPOPEXY N/A 05/30/2021   Procedure: XI ROBOTIC ASSISTED LAPAROSCOPIC SACROCOLPOPEXY;  Surgeon: Crist Fat, MD;  Location: [facility];  Service: Urology;  Laterality: N/A;   scoliosis  1970, 1972   no retained hardware   TOTAL KNEE ARTHROPLASTY Right 02/26/2018   Procedure: RIGHT TOTAL KNEE ARTHROPLASTY;  Surgeon: Eugenia Mcalpine, MD;  Location: [facility];  Service: Orthopedics;  Laterality: Right;    Social History:  Ambulatory   independently       reports that she quit smoking about 45 years ago. Her smoking use included cigarettes. She started smoking about 50 years ago. She has a 5 pack-year smoking history. She has never used smokeless tobacco. She reports current alcohol  use. She reports that she does not use drugs.   Family History:   Family History  Problem Relation Age of Onset   Varicose Veins Mother    Rheum arthritis Father    Cancer Sister    Heart disease Sister        before age 33   Hypertension Sister    Varicose Veins Sister    Heart attack Sister    Rheum arthritis Sister    Allergies Sister    Hyperlipidemia Brother    Sleep apnea Brother    Breast cancer Neg Hx     ______________________________________________________________________________________________ Allergies: Allergies  Allergen Reactions   Zantac [Ranitidine Hcl] Anaphylaxis   Alendronate     Other Reaction(s): burping, diarrhea   Morphine And Codeine Nausea And Vomiting    Morphine, Hydrocodone Tolerates Dilaudid, Fentanyl per Epic   Prednisone Diarrhea and Nausea And Vomiting   Ranitidine Swelling   Tramadol Nausea And Vomiting    AMS     Prior to Admission medications   Medication Sig Start Date End Date Taking? Authorizing Provider  metoprolol tartrate (LOPRESSOR) 25 MG tablet Take 1 tablet (25 mg total) by mouth 2 (two) times daily. 09/09/23  Yes Mallipeddi, Vishnu P, MD  amitriptyline (ELAVIL) 10 MG tablet TAKE 1 TABLET BY MOUTH EVERYDAY AT BEDTIME 09/08/23   Huston Foley, MD  ascorbic acid (VITAMIN C) 250 MG tablet Take 250 mg by mouth daily.    [provider]  aspirin-acetaminophen-caffeine (EXCEDRIN MIGRAINE) [id number] MG tablet Take 1 tablet by mouth as needed.    [provider]  atorvastatin (LIPITOR) 10 MG tablet Take 10 mg by mouth in the morning.    [provider]  Calcium Carb-Cholecalciferol 600-20 MG-MCG TABS Take 2 tablets by mouth daily at 12 noon.    [provider]  denosumab (PROLIA) 60 MG/ML SOSY injection USE AS DIRECTED SUBCUTANEOUS EVERY 6 MONTHS AND 1 DAY    [provider]  diclofenac sodium (VOLTAREN) 1 % GEL Apply 1 application topically 2 (two) times daily as needed (KNEE PAIN).    [provider]    ___________________________________________________________________________________________________ Physical Exam:    12/07/2023    5:48 PM 12/07/2023    4:00 PM 12/07/2023    2:15 PM  Vitals with BMI  Systolic 107  137  Diastolic 84  67  Pulse 83 94 73     1. General:  in No  Acute distress    Well  -appearing 2. Psychological: Alert and   Oriented 3. Head/ENT:   Dry Mucous Membranes                           Head Non traumatic, neck supple                          Normal  Dentition 4. SKIN decreased Skin turgor,  Skin clean Dry and intact no rash    5. Heart: Regular rate and rhythm no  Murmur, no Rub or gallop 6. Lungs:  Clear to auscultation bilaterally, no wheezes or crackles   7. Abdomen: Soft,post surgical 8. Lower extremities: no clubbing, cyanosis, no  edema 9. Neurologically Grossly intact, moving all 4 extremities equally  10. MSK: Normal range of motion    Chart has been reviewed  ______________________________________________________________________________________________  Assessment/Plan  73 y.o. female with medical history significant of HLD, OSA on CPAP, fam hx of prolonged QT syndrome, GERD,  PVD,    Admitted for colon perforation [facility])     Present on Admission:  Colon perforation (HCC)  OSA (obstructive sleep apnea)  Hypokalemia     Colon perforation (HCC) Appreciate general surgery consult and care. Defer to general surgery for postop care and recommendations Status post open laparotomy  Continue Zosyn per pharmacy  OSA (obstructive sleep apnea) Hold off on CPAP for tonight  Hypokalemia - will replace electrolytes and repeat  check Mg, phos and Ca level and replace as needed Monitor on telemetry   Lab Results  Component Value Date   K 3.4 (L) 12/07/2023     Lab Results  Component Value Date   CREATININE 0.64 12/07/2023   No results found for: "MG" Lab Results  Component Value Date   CALCIUM 9.1 12/07/2023     Renal cyst Follow-up as an outpatient with further imaging    Other plan as per orders.  DVT prophylaxis:  SCD       Code Status:    Code Status: Prior FULL CODE   as per patient   I had personally discussed CODE STATUS with patient   ACP   none   Family Communication:   Family not at  Bedside    Diet    Disposition Plan:       To home once workup is complete and patient is stable   Following barriers  for discharge:                                                         Electrolytes corrected                                                             Pain controlled with PO medications                            Will need consultants to evaluate patient prior to discharge       Consult Orders  (From admission, onward)           Start     Ordered   12/07/23 1738  Consult to hospitalist  Once       Provider:  (Not yet assigned)  Question Answer Comment  Place call to: [facility]   Reason for Consult Admit      12/07/23 1737                               Consults called:     Treatment Team:  Lynann Bologna, DO Ccs, Md, MD  Admission status:  ED Disposition     ED Disposition  Admit   Condition  --   Comment  The patient appears reasonably stabilized for admission considering the current resources, flow, and capabilities available in the ED at this time, and I doubt any other [facility] requiring further screening and/or treatment in the ED prior to admission is  present.            inpatient  I Expect 2 midnight stay secondary to severity of patient's current illness need for inpatient interventions justified by the following:  hemodynamic instability despite optimal treatment (tachycardia )   Severe lab/radiological/exam abnormalities including:    Perforated colon    That are currently affecting medical management.   I expect  patient to be hospitalized for 2 midnights requiring inpatient medical care.  Patient is at high risk for adverse outcome (such as loss of life or disability) if not treated.  Indication for inpatient stay as follows:     severe pain requiring acute inpatient management,    Need for operative/procedural  intervention     Need for IV antibiotics,  IV pain medications     Level of care     tele  For   24H   Deepa Barthel 12/07/2023, 10:46 PM    [facility]     after 2 AM please page floor  coverage PA If 7AM-7PM, please contact the day team taking care of the patient using [email or web address]

## 2023-12-07 NOTE — Assessment & Plan Note (Addendum)
 Appreciate general surgery consult and care. Defer to general surgery for postop care and recommendations Status post open laparotomy  Continue Zosyn per pharmacy

## 2023-12-07 NOTE — ED Triage Notes (Signed)
 Pt states she had a colonoscopy across the street and was informed she had a perforated colon that was noted to be 15 cm. Pt states she has 12/10 pain. Pt denies sob, nausea, or weakness at this time.

## 2023-12-07 NOTE — Progress Notes (Addendum)
 Pharmacy Antibiotic Note  Crystalmarie Yasin is a 73 y.o. female admitted on 12/07/2023 with colonic perforation and now intra-abdominal infection.  Pharmacy has been consulted for Zosyn dosing.  Patient taken to OR for Ex lap.  WBC 3.7. Tmax 99.5.   Plan: Zosyn 3.375g IV q8h (4 hour infusion).  Height: 5' (152.4 cm) Weight: 65.8 kg (145 lb) IBW/kg (Calculated) : 45.5  Temp (24hrs), Avg:98.4 F (36.9 C), Min:97.8 F (36.6 C), Max:99.5 F (37.5 C)  Recent Labs  Lab 12/07/23 1254  WBC 3.7*  CREATININE 0.64    Estimated Creatinine Clearance: 53 mL/min (by C-G formula based on SCr of 0.64 mg/dL).    Allergies  Allergen Reactions   Zantac [Ranitidine Hcl] Anaphylaxis   Alendronate     Other Reaction(s): burping, diarrhea   Morphine And Codeine Nausea And Vomiting    Morphine, Hydrocodone Tolerates Dilaudid, Fentanyl per Epic   Prednisone Diarrhea and Nausea And Vomiting   Ranitidine Swelling   Tramadol Nausea And Vomiting    AMS    Antimicrobials this admission: Zosyn 3/3 >> c   Microbiology results: None at this time   Thank you for allowing pharmacy to be a part of this patient's care.  Cedric Fishman 12/07/2023 9:18 PM

## 2023-12-07 NOTE — ED Provider Notes (Signed)
 Patient signed out to me by previous provider. Please refer to their note for full HPI.  Briefly this is a 73 year old female who is status post colonoscopy this morning with concern for perforation, clips were placed by the gastroenterologist during the procedure.  General surgery has been notified, there is free air in the x-ray and we are pending CT imaging.  CT of the abdomen pelvis has resulted, general surgery has reviewed and plan to take to surgery.  Recommend medicine admission.  Patients evaluation and results requires admission for further treatment and care.  Spoke with hospitalist, reviewed patient's ED course and they accept admission.  Patient agrees with admission plan, offers no new complaints and is stable/unchanged at time of admit.   Rozelle Logan, DO 12/07/23 9147

## 2023-12-07 NOTE — Progress Notes (Signed)
 Report called to RN Alden Server on [facility]. Patient is ready for transport to [facility]. Patient denies pain, tolerating ice water and resting comfortably. 2300 Patient transport to [facility] delayed by patient influx into PACU, and no availability for transport. Patient transported to [facility] on portable tele monitor by Vincent Gros, RN at [phone].

## 2023-12-07 NOTE — ED Provider Notes (Signed)
 [place] EMERGENCY DEPARTMENT AT [facility] Provider Note  CSN: [id number] Arrival date & time: 12/07/23 1146  Chief Complaint(s) No chief complaint on file.  HPI Meghan Avila is a 73 y.o. female history of GERD, arthritis presenting to the emergency department abdominal pain.  Patient was having a routine colonoscopy today with Dr. Marca Ancona.  During procedure, patient was noted to have a perforation, clips were placed, patient sent to the emergency department.  She reports that initially when she woke up and when she arrived to the emergency department she did not have much pain but subsequently has developed severe pain.  Denies nausea, vomiting.  No fevers, now reports some chills and shaking.  Shortness of breath, chest pain.   Past Medical History Past Medical History:  Diagnosis Date   Arthritis    Family history of adverse reaction to anesthesia    post nausea and vomitting   GERD (gastroesophageal reflux disease)    hx of   H/O wheezing    occ.   Hand swelling    related to lymph nodes    Headache    migraines   History of hiatal hernia    Leg swelling    left leg -edema knee level to ankle.   Osteoporosis    Peripheral vascular disease (HCC)    left leg    PONV (postoperative nausea and vomiting)    Scoliosis    Vertigo    Patient Active Problem List   Diagnosis Date Noted   PAC (premature atrial contraction) 09/09/2023   NSVT (nonsustained ventricular tachycardia) (HCC) 09/09/2023   Family history of heart disease 09/09/2023   OSA (obstructive sleep apnea) 09/09/2023   Cystocele with prolapse 05/30/2021   Pain in joint of left shoulder 04/13/2019   Aftercare 03/22/2018   History of total knee replacement, right 03/22/2018   Pain in right knee 03/16/2018   Primary osteoarthritis of right knee 02/26/2018   S/P knee replacement 02/26/2018   Osteoarthritis 02/17/2018   S/P Nissen fundoplication (without gastrostomy tube) procedure 06/29/2015    Upper airway cough syndrome 03/14/2015   Dyspnea 03/13/2015   Chronic venous insufficiency 07/21/2014   Lymphedema of arm 08/11/2011   Home Medication(s) Prior to Admission medications   Medication Sig Start Date End Date Taking? Authorizing Provider  amitriptyline (ELAVIL) 10 MG tablet TAKE 1 TABLET BY MOUTH EVERYDAY AT BEDTIME 09/08/23   Huston Foley, MD  ascorbic acid (VITAMIN C) 250 MG tablet Take 250 mg by mouth daily.    [provider]  aspirin-acetaminophen-caffeine (EXCEDRIN MIGRAINE) [phone] MG tablet Take 1 tablet by mouth as needed.    [provider]  atorvastatin (LIPITOR) 10 MG tablet Take 10 mg by mouth in the morning.    [provider]  Calcium Carb-Cholecalciferol 600-20 MG-MCG TABS Take 2 tablets by mouth daily at 12 noon.    [provider]  denosumab (PROLIA) 60 MG/ML SOSY injection USE AS DIRECTED SUBCUTANEOUS EVERY 6 MONTHS AND 1 DAY    [provider]  diclofenac sodium (VOLTAREN) 1 % GEL Apply 1 application topically 2 (two) times daily as needed (KNEE PAIN).    [provider]  metoprolol tartrate (LOPRESSOR) 25 MG tablet Take 1 tablet (25 mg total) by mouth 2 (two) times daily. 09/09/23   Mallipeddi, Orion Modest, MD  Past Surgical History Past Surgical History:  Procedure Laterality Date   ANAL RECTAL MANOMETRY N/A 08/03/2019   Procedure: ANO RECTAL MANOMETRY;  Surgeon: Romie Levee, MD;  Location: [facility];  Service: Endoscopy;  Laterality: N/A;   BACK SURGERY     scoliosis surgery 1970 and 1972   BREAST REDUCTION SURGERY  1997   CHOLECYSTECTOMY  1992   laparoscopic   COLONOSCOPY WITH PROPOFOL N/A 04/19/2015   Procedure: COLONOSCOPY WITH PROPOFOL;  Surgeon: Charolett Bumpers, MD;  Location: [facility];  Service: Endoscopy;  Laterality: N/A;   DEEP AXILLARY SENTINEL NODE  BIOPSY / EXCISION     x2   ESOPHAGEAL MANOMETRY N/A 05/04/2015   Procedure: ESOPHAGEAL MANOMETRY (EM);  Surgeon: Charolett Bumpers, MD;  Location: [facility];  Service: Endoscopy;  Laterality: N/A;   ESOPHAGOGASTRODUODENOSCOPY (EGD) WITH PROPOFOL N/A 04/19/2015   Procedure: ESOPHAGOGASTRODUODENOSCOPY (EGD) WITH PROPOFOL;  Surgeon: Charolett Bumpers, MD;  Location: [facility];  Service: Endoscopy;  Laterality: N/A;   HEMORROIDECTOMY  2002, 2005   hiatal hernia surgery      JOINT REPLACEMENT     total right knee Dr. Thomasena Edis 02-26-18   LASER ABLATION  2008   left leg    left knee surgery - torn meniscus (left)     LIPOMA EXCISION N/A 09/21/2015   Procedure: EXCISION 6CM BACK LIPOMA;  Surgeon: Axel Filler, MD;  Location: [facility];  Service: General;  Laterality: N/A;   MENISCUS REPAIR     right   REDUCTION MAMMAPLASTY Bilateral 1998   right shoulder rotator cuff surgery      ROBOTIC ASSISTED LAPAROSCOPIC SACROCOLPOPEXY N/A 05/30/2021   Procedure: XI ROBOTIC ASSISTED LAPAROSCOPIC SACROCOLPOPEXY;  Surgeon: Crist Fat, MD;  Location: [facility];  Service: Urology;  Laterality: N/A;   scoliosis  1970, 1972   no retained hardware   TOTAL KNEE ARTHROPLASTY Right 02/26/2018   Procedure: RIGHT TOTAL KNEE ARTHROPLASTY;  Surgeon: Eugenia Mcalpine, MD;  Location: [facility];  Service: Orthopedics;  Laterality: Right;   Family History Family History  Problem Relation Age of Onset   Varicose Veins Mother    Rheum arthritis Father    Cancer Sister    Heart disease Sister        before age 45   Hypertension Sister    Varicose Veins Sister    Heart attack Sister    Rheum arthritis Sister    Allergies Sister    Hyperlipidemia Brother    Sleep apnea Brother    Breast cancer Neg Hx     Social History Social History   Tobacco Use   Smoking status: Former    Current packs/day: 0.00    Average packs/day: 1 pack/day for 5.0 years (5.0 ttl pk-yrs)    Types: Cigarettes    Start date:  10/06/1973    Quit date: 10/06/1978    Years since quitting: 45.2   Smokeless tobacco: Never  Vaping Use   Vaping status: Never Used  Substance Use Topics   Alcohol use: Yes    Comment: occ beer or glass of wine   Drug use: No   Allergies Zantac [ranitidine hcl], Alendronate, Morphine and codeine, Prednisone, Ranitidine, and Tramadol  Review of Systems Review of Systems  All other systems reviewed and are negative.   Physical Exam Vital Signs  I have reviewed the triage vital signs BP (!) 160/87 (BP Location: Left Arm)   Pulse 68   Temp 97.8 F (36.6 C) (Oral)   Resp 16  Ht 5' (1.524 m)   Wt 65.8 kg   SpO2 99%   BMI 28.32 kg/m  Physical Exam Vitals and nursing note reviewed.  Constitutional:      General: She is in acute distress.     Appearance: She is well-developed.  HENT:     Head: Normocephalic and atraumatic.     Mouth/Throat:     Mouth: Mucous membranes are moist.  Eyes:     Pupils: Pupils are equal, round, and reactive to light.  Cardiovascular:     Rate and Rhythm: Normal rate and regular rhythm.     Heart sounds: No murmur heard. Pulmonary:     Effort: Pulmonary effort is normal. No respiratory distress.     Breath sounds: Normal breath sounds.  Abdominal:     General: Abdomen is flat.     Palpations: Abdomen is soft.     Tenderness: There is abdominal tenderness (diffuse, severe). There is guarding.  Musculoskeletal:        General: No tenderness.     Right lower leg: No edema.     Left lower leg: No edema.  Skin:    General: Skin is warm and dry.  Neurological:     General: No focal deficit present.     Mental Status: She is alert. Mental status is at baseline.  Psychiatric:        Mood and Affect: Mood normal.        Behavior: Behavior normal.     ED Results and Treatments Labs (all labs ordered are listed, but only abnormal results are displayed) Labs Reviewed  CBC WITH DIFFERENTIAL/PLATELET - Abnormal; Notable for the following  components:      Result Value   WBC 3.7 (*)    RDW 15.6 (*)    All other components within normal limits  COMPREHENSIVE METABOLIC PANEL - Abnormal; Notable for the following components:   Potassium 3.4 (*)    BUN 7 (*)    All other components within normal limits                                                                                                                          Radiology No results found.  Pertinent labs & imaging results that were available during my care of the patient were reviewed by me and considered in my medical decision making (see MDM for details).  Medications Ordered in ED Medications  piperacillin-tazobactam (ZOSYN) IVPB 3.375 g (3.375 g Intravenous New Bag/Given 12/07/23 1511)  acetaminophen (OFIRMEV) IV 1,000 mg (has no administration in time range)  sodium chloride 0.9 % bolus 1,000 mL (0 mLs Intravenous Stopped 12/07/23 1519)  ondansetron (ZOFRAN) injection 4 mg (4 mg Intravenous Given 12/07/23 1416)  HYDROmorphone (DILAUDID) injection 1 mg (1 mg Intravenous Given 12/07/23 1417)  Procedures .Critical Care  Performed by: Lonell Grandchild, MD Authorized by: Lonell Grandchild, MD   Critical care provider statement:    Critical care time (minutes):  30   Critical care was necessary to treat or prevent imminent or life-threatening deterioration of the following conditions: perforated colon.   Critical care was time spent personally by me on the following activities:  Development of treatment plan with patient or surrogate, discussions with consultants, evaluation of patient's response to treatment, examination of patient, ordering and review of laboratory studies, ordering and review of radiographic studies, ordering and performing treatments and interventions, pulse oximetry, re-evaluation of patient's condition and review of  old charts   (including critical care time)  Medical Decision Making / ED Course   MDM:  73 year old presenting to the emergency department with apparent perforation during colonoscopy  Patient is peritonitic on exam likely due to known perforation.  Vital signs are reassuring.  Patient seems to be having rigors but no fever.  1 view abdominal x-ray was obtained which does show some free air under the diaphragm.  General surgery consulted and they will see the patient.  Will order CT abdomen as well pending general surgery evaluation.  Clips were placed by gastroenterologist following perforation, free air may also be from initial perforation.  Will cover with antibiotics.  Pain improved following pain medication.  Signed out to oncoming provider Dr. Wilkie Aye pending results of surgical consult and CT scan.  Dissipate patient will need admission.      Additional history obtained: -Additional history obtained from family and ems -External records from outside source obtained and reviewed including: Chart review including previous notes, labs, imaging, consultation notes including GI records brought by patient    Lab Tests: -I ordered, reviewed, and interpreted labs.   The pertinent results include:   Labs Reviewed  CBC WITH DIFFERENTIAL/PLATELET - Abnormal; Notable for the following components:      Result Value   WBC 3.7 (*)    RDW 15.6 (*)    All other components within normal limits  COMPREHENSIVE METABOLIC PANEL - Abnormal; Notable for the following components:   Potassium 3.4 (*)    BUN 7 (*)    All other components within normal limits    Notable for mild hypokalemia    Imaging Studies ordered: I ordered imaging studies including XR abdomen On my interpretation imaging demonstrates free air  I independently visualized and interpreted imaging. I agree with the radiologist interpretation   Medicines ordered and prescription drug management: Meds ordered this  encounter  Medications   piperacillin-tazobactam (ZOSYN) IVPB 3.375 g   sodium chloride 0.9 % bolus 1,000 mL   ondansetron (ZOFRAN) injection 4 mg   HYDROmorphone (DILAUDID) injection 1 mg   acetaminophen (OFIRMEV) IV 1,000 mg    Is the patient UNABLE to take oral / enteral medications?:   Yes    -I have reviewed the patients home medicines and have made adjustments as needed   Consultations Obtained: I requested consultation with the general surgery,  and discussed lab and imaging findings as well as pertinent plan - they recommend: they will consult   Cardiac Monitoring: The patient was maintained on a cardiac monitor.  I personally viewed and interpreted the cardiac monitored which showed an underlying rhythm of: NSR  Reevaluation: After the interventions noted above, I reevaluated the patient and found that their symptoms have improved  Co morbidities that complicate the patient evaluation  Past Medical History:  Diagnosis Date  Arthritis    Family history of adverse reaction to anesthesia    post nausea and vomitting   GERD (gastroesophageal reflux disease)    hx of   H/O wheezing    occ.   Hand swelling    related to lymph nodes    Headache    migraines   History of hiatal hernia    Leg swelling    left leg -edema knee level to ankle.   Osteoporosis    Peripheral vascular disease (HCC)    left leg    PONV (postoperative nausea and vomiting)    Scoliosis    Vertigo       Dispostion: Disposition decision including need for hospitalization was considered, and patient disposition pending at time of sign out.    Final Clinical Impression(s) / ED Diagnoses Final diagnoses:  Intestinal perforation (HCC)     This chart was dictated using voice recognition software.  Despite best efforts to proofread,  errors can occur which can change the documentation meaning.    Lonell Grandchild, MD 12/07/23 1535

## 2023-12-07 NOTE — Transfer of Care (Signed)
 Immediate Anesthesia Transfer of Care Note  Patient: Meghan Avila  Procedure(s) Performed: EXPLORATORY LAPAROTOMY (Abdomen)  Patient Location: PACU  Anesthesia Type:General  Level of Consciousness: awake, drowsy, patient cooperative, pateint uncooperative, and responds to stimulation  Airway & Oxygen Therapy: Patient Spontanous Breathing and Patient connected to face mask oxygen  Post-op Assessment: Report given to RN and Post -op Vital signs reviewed and stable  Post vital signs: Reviewed and stable  Last Vitals:  Vitals Value Taken Time  BP 101/77 12/07/23 2045  Temp 36.6 C 12/07/23 2043  Pulse 97 12/07/23 2046  Resp 12 12/07/23 2046  SpO2 100 % 12/07/23 2046  Vitals shown include unfiled device data.  Last Pain:  Vitals:   12/07/23 2043  TempSrc:   PainSc: Asleep         Complications: No notable events documented.

## 2023-12-07 NOTE — Anesthesia Preprocedure Evaluation (Addendum)
 Anesthesia Evaluation  Patient identified by MRN, date of birth, ID band Patient awake    Reviewed: Allergy & Precautions, NPO status , Patient's Chart, lab work & pertinent test results, reviewed documented beta blocker date and time Preop documentation limited or incomplete due to emergent nature of procedure.  History of Anesthesia Complications (+) PONV and history of anesthetic complications  Airway Mallampati: II  TM Distance: >3 FB Neck ROM: Full    Dental no notable dental hx. (+) Dental Advisory Given, Teeth Intact   Pulmonary sleep apnea , former smoker   Pulmonary exam normal breath sounds clear to auscultation       Cardiovascular hypertension, Pt. on home beta blockers + Peripheral Vascular Disease  Normal cardiovascular exam Rhythm:Regular Rate:Normal     Neuro/Psych  Headaches  negative psych ROS   GI/Hepatic Neg liver ROS, hiatal hernia,GERD  ,,Colon perforation    Endo/Other  negative endocrine ROS    Renal/GU negative Renal ROS     Musculoskeletal  (+) Arthritis ,    Abdominal   Peds  Hematology negative hematology ROS (+)   Anesthesia Other Findings Day of surgery medications reviewed with the patient.  Reproductive/Obstetrics                             Anesthesia Physical Anesthesia Plan  ASA: 2  Anesthesia Plan: General   Post-op Pain Management: Ofirmev IV (intra-op)*   Induction: Intravenous  PONV Risk Score and Plan: 4 or greater and Propofol infusion, TIVA, Treatment may vary due to age or medical condition, Ondansetron and Dexamethasone  Airway Management Planned: Oral ETT  Additional Equipment:   Intra-op Plan:   Post-operative Plan: Extubation in OR  Informed Consent: I have reviewed the patients History and Physical, chart, labs and discussed the procedure including the risks, benefits and alternatives for the proposed anesthesia with the  patient or authorized representative who has indicated his/her understanding and acceptance.     Dental advisory given  Plan Discussed with: CRNA  Anesthesia Plan Comments:        Anesthesia Quick Evaluation

## 2023-12-07 NOTE — Op Note (Signed)
 Date: 12/07/23  Patient: Meghan Avila MRN: [id number]  Preoperative Diagnosis: Colon perforation Postoperative Diagnosis: Same  Procedure: Exploratory laparotomy, colorrhaphy  Surgeon: Sophronia Simas, MD  EBL: 50 mL  Anesthesia: General endotracheal  Specimens: None  Indications: Meghan Avila is a 73 yo female who underwent a screening colonoscopy today due to her previous history of colon polyps. She was found during the procedure to have a perforation in the sigmoid colon. Closure was attempted with metal clips, and the patient was then referred to the ED for further care. A CT scan showed thickening of the rectosigmoid colon, with surrounding free air. The patient had significant abdominal pain, tenderness, and mild tachycardia. After a discussion of the risks and benefits of surgery, she agreed to proceed with exploratory laparotomy.  Findings: Approximately 1cm perforation at the rectosigmoid junction, with an associated mesenteric rent. Minimal turbid intraabdominal fluid. Colonic defect was repaired primarily.  Procedure details: Informed consent was obtained in the preoperative area prior to the procedure. The patient was brought to the operating room and placed on the table in the supine position. General anesthesia was induced and appropriate lines and drains were placed for intraoperative monitoring. Perioperative antibiotics were administered per SCIP guidelines. The abdomen was prepped and draped in the usual sterile fashion. A pre-procedure timeout was taken verifying patient identity, surgical site and procedure to be performed.  A lower midline skin incision was made and extended just above the umbilicus.  The subcutaneous tissue was divided with cautery to expose the fascia, and the fascia was elevated and opened along the linea alba.  The peritoneum was opened with cautery.  There was air and a small amount of turbid free fluid on entry into the abdomen.  An Research officer, political party and Bookwalter fixed retractor were placed.  The sigmoid colon was identified and followed distally to the rectosigmoid junction.  There was an adhesive band between the sigmoid colon and the dome of the bladder, which was divided with cautery, taking care to stay away from the bladder.  The tattoo was identified at the rectosigmoid junction, and the site of perforation was then visible just proximal to the site of the tattoo.  There was a full-thickness defect measuring approximately 1 cm, with the endoscopic clips visible within the defect.  There was an associated small mesenteric rent, but the colon appeared pink and well-perfused.  The tissue surrounding the defect was well-perfused.  As the defect involved less than half the circumference of the colon and was not a thermal injury, I elected to repair this primarily.  The endoscopic clips were carefully removed.  The defect in the wall of the colon was closed using interrupted 3-0 Vicryl sutures, taking full-thickness bites.  The repair was then oversewn with 3-0 silk Lembert sutures, incorporating the epiploic fat into the closure.  The abdomen was then irrigated with warm saline.  There was some oozing from the mesenteric rent, which was controlled with cautery. The colon remained well-perfused.  A 19-Fr JP drain was placed adjacent to the colon repair and brought out through the right lower quadrant abdominal wall.  The drain was secured to the skin with 2-0 nylon suture.  The wound protector and retractors were removed.  The fascia was closed at midline with a running looped 1 PDS suture.  The skin was closed with staples and a honeycomb dressing was applied.  The patient tolerated the procedure well with no apparent complications.  All counts were correct x2 at the  end of the procedure. The patient was extubated and taken to PACU in stable condition.  Sophronia Simas, MD 12/07/23 9:04 PM

## 2023-12-07 NOTE — Interval H&P Note (Signed)
 History and Physical Interval Note:  12/07/2023 5:55 PM  Meghan Avila  has presented today for surgery, with the diagnosis of colon perforation.  The various methods of treatment have been discussed with the patient and family. After consideration of risks, benefits and other options for treatment, the patient has consented to  Procedure(s): EXPLORATORY LAPAROTOMY (N/A) as a surgical intervention, with possible primary repair of sigmoid colon, vs repair with diverting ostomy vs resection. Anticipated recovery course was reviewed. The patient's history has been reviewed, patient examined, no change in status, stable for surgery.  I have reviewed the patient's chart and labs.  Questions were answered to the patient's satisfaction.     Fritzi Mandes

## 2023-12-07 NOTE — Assessment & Plan Note (Signed)
 Hold off on CPAP for tonight

## 2023-12-08 ENCOUNTER — Other Ambulatory Visit: Payer: Self-pay

## 2023-12-08 ENCOUNTER — Encounter (HOSPITAL_COMMUNITY): Payer: Self-pay | Admitting: Surgery

## 2023-12-08 DIAGNOSIS — K631 Perforation of intestine (nontraumatic): Secondary | ICD-10-CM | POA: Diagnosis not present

## 2023-12-08 LAB — BASIC METABOLIC PANEL
Anion gap: 12 (ref 5–15)
Anion gap: 9 (ref 5–15)
BUN: 7 mg/dL — ABNORMAL LOW (ref 8–23)
BUN: 9 mg/dL (ref 8–23)
CO2: 20 mmol/L — ABNORMAL LOW (ref 22–32)
CO2: 22 mmol/L (ref 22–32)
Calcium: 7.7 mg/dL — ABNORMAL LOW (ref 8.9–10.3)
Calcium: 7.8 mg/dL — ABNORMAL LOW (ref 8.9–10.3)
Chloride: 107 mmol/L (ref 98–111)
Chloride: 109 mmol/L (ref 98–111)
Creatinine, Ser: 0.63 mg/dL (ref 0.44–1.00)
Creatinine, Ser: 0.66 mg/dL (ref 0.44–1.00)
GFR, Estimated: >60 mL/min (ref >60)
GFR, Estimated: >60 mL/min (ref >60)
Glucose, Bld: 154 mg/dL — ABNORMAL HIGH (ref 70–99)
Glucose, Bld: 172 mg/dL — ABNORMAL HIGH (ref 70–99)
Potassium: 3.1 mmol/L — ABNORMAL LOW (ref 3.5–5.1)
Potassium: 3.5 mmol/L (ref 3.5–5.1)
Sodium: 138 mmol/L (ref 135–145)
Sodium: 141 mmol/L (ref 135–145)

## 2023-12-08 LAB — CBC
HCT: 33 % — ABNORMAL LOW (ref 36.0–46.0)
Hemoglobin: 10.6 g/dL — ABNORMAL LOW (ref 12.0–15.0)
MCH: 26.1 pg (ref 26.0–34.0)
MCHC: 32.1 g/dL (ref 30.0–36.0)
MCV: 81.3 fL (ref 80.0–100.0)
Platelets: 152 10*3/uL (ref 150–400)
RBC: 4.06 MIL/uL (ref 3.87–5.11)
RDW: 15.5 % (ref 11.5–15.5)
WBC: 11.3 10*3/uL — ABNORMAL HIGH (ref 4.0–10.5)
nRBC: 0 % (ref 0.0–0.2)

## 2023-12-08 LAB — MAGNESIUM: Magnesium: 1.5 mg/dL — ABNORMAL LOW (ref 1.7–2.4)

## 2023-12-08 MED ORDER — ROCURONIUM BROMIDE 10 MG/ML (PF) SYRINGE
PREFILLED_SYRINGE | INTRAVENOUS | Status: AC
  Filled 2023-12-08: qty 10

## 2023-12-08 MED ORDER — AMITRIPTYLINE HCL 10 MG PO TABS
10.0000 mg | ORAL_TABLET | Freq: Every day | ORAL | Status: DC
Start: 2023-12-08 — End: 2023-12-11
  Administered 2023-12-08 – 2023-12-10 (×3): 10 mg via ORAL
  Filled 2023-12-08 (×4): qty 1

## 2023-12-08 MED ORDER — MAGNESIUM SULFATE 2 GM/50ML IV SOLN
2.0000 g | Freq: Once | INTRAVENOUS | Status: AC
  Administered 2023-12-08: 2 g via INTRAVENOUS
  Filled 2023-12-08: qty 50

## 2023-12-08 MED ORDER — METOPROLOL TARTRATE 25 MG PO TABS
25.0000 mg | ORAL_TABLET | Freq: Two times a day (BID) | ORAL | Status: DC
  Administered 2023-12-08 – 2023-12-11 (×6): 25 mg via ORAL
  Filled 2023-12-08 (×7): qty 1

## 2023-12-08 MED ORDER — METHOCARBAMOL 1000 MG/10ML IJ SOLN
500.0000 mg | Freq: Three times a day (TID) | INTRAMUSCULAR | Status: DC
  Administered 2023-12-08 – 2023-12-11 (×10): 500 mg via INTRAVENOUS
  Filled 2023-12-08 (×10): qty 10

## 2023-12-08 MED ORDER — ATORVASTATIN CALCIUM 10 MG PO TABS
10.0000 mg | ORAL_TABLET | Freq: Every morning | ORAL | Status: DC
  Administered 2023-12-09 – 2023-12-11 (×3): 10 mg via ORAL
  Filled 2023-12-08 (×4): qty 1

## 2023-12-08 MED ORDER — POTASSIUM CHLORIDE 20 MEQ PO PACK
40.0000 meq | PACK | Freq: Once | ORAL | Status: AC
  Administered 2023-12-08: 40 meq via ORAL
  Filled 2023-12-08: qty 2

## 2023-12-08 NOTE — Anesthesia Postprocedure Evaluation (Signed)
 Anesthesia Post Note  Patient: Meghan Avila  Procedure(s) Performed: EXPLORATORY LAPAROTOMY (Abdomen)     Patient location during evaluation: PACU Anesthesia Type: General Level of consciousness: sedated and patient cooperative Pain management: pain level controlled Vital Signs Assessment: post-procedure vital signs reviewed and stable Respiratory status: spontaneous breathing Cardiovascular status: stable Anesthetic complications: no   There were no known notable events for this encounter.  Last Vitals:  Vitals:   12/07/23 2230 12/07/23 2313  BP: 120/80 (!) 107/91  Pulse: 88 100  Resp: 17 18  Temp: 37 C 37.1 C  SpO2: 90% 97%    Last Pain:  Vitals:   12/07/23 2336  TempSrc:   PainSc: 6                  Lewie Loron

## 2023-12-08 NOTE — Progress Notes (Signed)
 1 Day Post-Op   Subjective/Chief Complaint: Hurts postop   Objective: Vital signs in last 24 hours: Temp:  [97.2 F (36.2 C)-99.5 F (37.5 C)] 97.2 F (36.2 C) (03/04 0750) Pulse Rate:  [68-100] 76 (03/04 0750) Resp:  [15-21] 15 (03/04 0750) BP: (92-160)/(58-91) 99/80 (03/04 0750) SpO2:  [90 %-100 %] 95 % (03/04 0750) Weight:  [65.8 kg-67.3 kg] 67.3 kg (03/04 0500)    Intake/Output from previous day: 03/03 0701 - 03/04 0700 In: 2613.6 [I.V.:2563.6; IV Piggyback:50] Out: 820 [Urine:675; Drains:100; Blood:45] Intake/Output this shift: No intake/output data recorded.  General nad Cv regular Ab approp tender, drain serosang, nondistended dressing dry  Lab Results:  Recent Labs    12/07/23 1254 12/08/23 0700  WBC 3.7* 11.3*  HGB 13.2 10.6*  HCT 41.7 33.0*  PLT 215 152   BMET Recent Labs    12/07/23 2340 12/08/23 0700  NA 141 138  K 3.5 3.1*  CL 109 107  CO2 20* 22  GLUCOSE 154* 172*  BUN 7* 9  CREATININE 0.66 0.63  CALCIUM 7.8* 7.7*   PT/INR No results for input(s): "LABPROT", "INR" in the last 72 hours. ABG No results for input(s): "PHART", "HCO3" in the last 72 hours.  Invalid input(s): "PCO2", "PO2"  Studies/Results: CT ABDOMEN PELVIS W CONTRAST Result Date: 12/07/2023 CLINICAL DATA:  Peritonitis. EXAM: CT ABDOMEN AND PELVIS WITH CONTRAST TECHNIQUE: Multidetector CT imaging of the abdomen and pelvis was performed using the standard protocol following bolus administration of intravenous contrast. RADIATION DOSE REDUCTION: This exam was performed according to the departmental dose-optimization program which includes automated exposure control, adjustment of the mA and/or kV according to patient size and/or use of iterative reconstruction technique. CONTRAST:  75mL OMNIPAQUE IOHEXOL 350 MG/ML SOLN COMPARISON:  CT 03/01/2020. Chest abdomen x-ray series earlier 12/07/2023 FINDINGS: Lower chest: Mild linear opacity lung bases likely scar or atelectasis. No  pleural effusion. Small hiatal hernia. Hepatobiliary: Previous cholecystectomy. There is slight ectasia of the biliary tree but normal tapering of the common duct towards the pancreatic head. Slightly more pronounced than previous. Preserved hepatic parenchyma. Patent portal vein. Pancreas: Unremarkable. No pancreatic ductal dilatation or surrounding inflammatory changes. Spleen: Normal in size without focal abnormality. Adrenals/Urinary Tract: The adrenal glands are preserved. Bosniak 1 lower pole right-sided dominant renal cyst seen measuring 6.7 cm in diameter. Hounsfield unit of 10. Tiny other cystic lesions identified in each kidney. There is 1 lesion exophytic from the midportion of the right kidney which appears complex. This is a small lesion measuring 9 mm on series 3, image 28. This was a tiny focus in 2021. This is more indeterminate lesion. Recommend either six-month follow-up study or MRI as clinically appropriate. Preserved contour to the urinary bladder. Stomach/Bowel: Surgical changes along the rectum. The large bowel is nondilated. Scattered colonic diverticula identified along the descending and sigmoid colon. There is significant wall thickening along the mid sigmoid colon with severe stranding. There are some metallic clips in this location. Please correlate with provided history of recent colonoscopy. There is significant adjacent free air extending superior to this portion of the colon. Additional scattered areas of free air seen scattered throughout the mesentery and up by the level of the diaphragm as well consistent with perforation. The appendix has a normal caliber in the right lower quadrant. The stomach and small bowel are nondilated. Vascular/Lymphatic: No significant vascular findings are present. No enlarged abdominal or pelvic lymph nodes. Reproductive: Status post hysterectomy. No adnexal masses. Other: Small amount of free fluid  in the pelvis. Again scattered free air, central  mesenteric stranding. Musculoskeletal: Curvature of the lumbar spine with moderate degenerative changes. Pars defects at L5 with grade 2 anterolisthesis of L5 on S1. Multilevel disc bulging and stenosis along the spine. Critical Value/emergent results were called by telephone at the time of interpretation on 12/07/2023 at 4:54 pm to provider Dr. Wilkie Aye, who verbally acknowledged these results. IMPRESSION: Free air identified greatest surrounding the mid sigmoid colon where there is wall thickening, stranding and some clips. Please correlate for focal bowel perforation of the colon. No associated bowel obstruction. No extraluminal rim enhancing fluid collection suggested. Scattered stranding and trace simple free fluid in the pelvis. Previous cholecystectomy. Small hiatal hernia. Surgical changes along the rectum. Small slightly complex cystic lesion along the right mid kidney. Too small to completely characterize. Recommend simple follow-up CT in 6 months with without contrast. If there is more clinical concern at this time a follow up MRI could be considered when clinically appropriate. Electronically Signed   By: [name] Kays M.D.   On: 12/07/2023 16:56   DG Abdomen Acute W/Chest Result Date: 12/07/2023 CLINICAL DATA:  Bowel perforation. EXAM: DG ABDOMEN ACUTE WITH 1 VIEW CHEST COMPARISON:  Chest x-ray 09/02/2016 FINDINGS: Underinflation. No consolidation, pneumothorax or effusion. No edema. Normal cardiopericardial silhouette. Surgical clips in the right axillary region. Overlapping cardiac leads. Degenerative changes of the spine and shoulders. Metallic foci along the pelvis, possible clips. There is also presumed cholecystectomy clips in the right upper quadrant. There is gaseous distention of loops of colon the mid abdomen. Few mildly dilated loops of small bowel left lower quadrant. There appears to be some free air beneath the diaphragm. Please see separate CT scan from same day. Moderate degenerative  changes of the spine with curvature. Presumed vascular calcifications in the pelvis. IMPRESSION: Nonspecific bowel gas pattern with a few distended loops of air-filled small and large bowel. There does appear to be free air however beneath the diaphragm and recommend correlation with the separate CT scan from same day. Underinflation of the lungs.  No consolidation or effusion. Metallic clips in the central pelvis and right upper quadrant of the abdomen. Electronically Signed   By: [name] Kays M.D.   On: 12/07/2023 16:48    Anti-infectives: Anti-infectives (From admission, onward)    Start     Dose/Rate Route Frequency Ordered Stop   12/08/23 0000  piperacillin-tazobactam (ZOSYN) IVPB 3.375 g  Status:  Discontinued        3.375 g 12.5 mL/hr over 240 Minutes Intravenous Every 8 hours 12/07/23 2157 12/07/23 2311   12/07/23 2330  piperacillin-tazobactam (ZOSYN) IVPB 3.375 g        3.375 g 12.5 mL/hr over 240 Minutes Intravenous Every 8 hours 12/07/23 2120     12/07/23 1345  piperacillin-tazobactam (ZOSYN) IVPB 3.375 g        3.375 g 100 mL/hr over 30 Minutes Intravenous  Once 12/07/23 1341 12/07/23 1541       Assessment/Plan: POD 1 colorraphy after colonoscopic perforation- SA -clears for now until tolerating and has bowel function -will adjust pain meds today -oob, pulm toilet -PT consult -continue drain will remove prior to dc -needs to remain until having bm/flatus -home meds -lovenox, scds -recheck cbc tomorrow -dc foley  Emelia Loron 12/08/2023

## 2023-12-08 NOTE — Progress Notes (Addendum)
 PROGRESS NOTE  Meghan Avila ZOX:[id number] DOB: 10/27/1950 DOA: 12/07/2023 PCP: Sigmund Hazel, MD   LOS: 1 day   Brief narrative:  Patient is a 73 years old female with past medical history of hyperlipidemia, obstructive sleep apnea on CPAP, GERD, history of colon polyps, peripheral vascular disease presented to the hospital abdominal pain after undergoing routine colonoscopy for surveillance for previous polyps..  During the procedure,  she was noted to have colonic perforation in close it was attempted with 6 metallic clips GI and was sent to the ED for further care.  In the ED patient was mildly tachycardic.  Initial labs showed hypokalemia with potassium of 3.4.  CT scan of the abdomen showed thickening of the rectosigmoid colon with rounding free air.  General surgery was emergently consulted and underwent emergent exploratory laparotomy.   Assessment/Plan: Principal Problem:   Colon perforation (HCC) Active Problems:   OSA (obstructive sleep apnea)   Hypokalemia   Renal cyst  Colonic perforation.  Status post exploratory laparotomy with colorrhaphy general surgery on 12/07/2023.  General surgery on board.  Continue Zosyn.  Follow general surgery recommendations.  Currently on clears.  Continue IV fluids.  Analgesics antiemetics and supportive care.  Continue incentive spirometry.  Encourage out of bed.  PT consultation.  Will discontinue Foley catheter.  Hyperlipidemia.  On Lipitor.   History of peripheral vascular disease.  On.  Lipitor will continue.   Obstructive sleep apnea on nocturnal CPAP.  Hypokalemia.  Replenished.  Check BMP in AM.   DVT prophylaxis: enoxaparin (LOVENOX) injection 40 mg Start: 12/08/23 0800 SCD's Start: 12/07/23 2158 Place TED hose Start: 12/07/23 2158   Disposition: Home likely in 1 to 2 days when okay with general surgery.  Status is: Inpatient Remains inpatient appropriate because: Status post surgery,    Code Status:     Code Status:  Full Code  Family Communication: None at bedside  Consultants: GI and general surgery  Procedures: Status post exploratory laparotomy with colorrhaphy on 12/07/2018  Anti-infectives:  Zosyn IV  Anti-infectives (From admission, onward)    Start     Dose/Rate Route Frequency Ordered Stop   12/08/23 0000  piperacillin-tazobactam (ZOSYN) IVPB 3.375 g  Status:  Discontinued        3.375 g 12.5 mL/hr over 240 Minutes Intravenous Every 8 hours 12/07/23 2157 12/07/23 2311   12/07/23 2330  piperacillin-tazobactam (ZOSYN) IVPB 3.375 g        3.375 g 12.5 mL/hr over 240 Minutes Intravenous Every 8 hours 12/07/23 2120     12/07/23 1345  piperacillin-tazobactam (ZOSYN) IVPB 3.375 g        3.375 g 100 mL/hr over 30 Minutes Intravenous  Once 12/07/23 1341 12/07/23 1541      Subjective: Today, patient was seen and examined at bedside.  Complains of mild nausea and abdominal discomfort.  Has not had a bowel movement gas.  No shortness of breath dyspnea or chest pain.  Objective: Vitals:   12/08/23 0742 12/08/23 0750  BP: (!) 92/58 99/80  Pulse: 72 76  Resp: 18 15  Temp: 98.2 F (36.8 C) (!) 97.2 F (36.2 C)  SpO2: 95% 95%    Intake/Output Summary (Last 24 hours) at 12/08/2023 1032 Last data filed at 12/08/2023 8119 Gross per 24 hour  Intake 2613.58 ml  Output 820 ml  Net 1793.58 ml   Filed Weights   12/07/23 1242 12/08/23 0500  Weight: 65.8 kg 67.3 kg   Body mass index is 28.98 kg/m.  Physical Exam:  GENERAL: Patient is alert awake and oriented. Not in obvious distress.  On nasal cannula oxygen. HENT: No scleral pallor or icterus. Pupils equally reactive to light. Oral mucosa is moist NECK: is supple, no gross swelling noted. CHEST: Clear to auscultation. No crackles or wheezes.  Diminished breath sounds bilaterally. CVS: S1 and S2 heard, no murmur. Regular rate and rhythm.  ABDOMEN: Soft, appropriately tender, midline incision with honeycomb, right lower abdominal drain,  Foley catheter in place.  Bowel sounds are present. EXTREMITIES: No edema. CNS: Cranial nerves are intact. No focal motor deficits. SKIN: warm and dry without rashes.  Data Review: I have personally reviewed the following laboratory data and studies,  CBC: Recent Labs  Lab 12/07/23 1254 12/08/23 0700  WBC 3.7* 11.3*  NEUTROABS 1.7  --   HGB 13.2 10.6*  HCT 41.7 33.0*  MCV 82.4 81.3  PLT 215 152   Basic Metabolic Panel: Recent Labs  Lab 12/07/23 1254 12/07/23 2340 12/08/23 0700  NA 141 141 138  K 3.4* 3.5 3.1*  CL 104 109 107  CO2 23 20* 22  GLUCOSE 90 154* 172*  BUN 7* 7* 9  CREATININE 0.64 0.66 0.63  CALCIUM 9.1 7.8* 7.7*  MG  --  1.5*  --    Liver Function Tests: Recent Labs  Lab 12/07/23 1254  AST 24  ALT 17  ALKPHOS 54  BILITOT 0.5  PROT 6.5  ALBUMIN 3.9   No results for input(s): "LIPASE", "AMYLASE" in the last 168 hours. No results for input(s): "AMMONIA" in the last 168 hours. Cardiac Enzymes: No results for input(s): "CKTOTAL", "CKMB", "CKMBINDEX", "TROPONINI" in the last 168 hours. BNP (last 3 results) No results for input(s): "BNP" in the last 8760 hours.  ProBNP (last 3 results) No results for input(s): "PROBNP" in the last 8760 hours.  CBG: No results for input(s): "GLUCAP" in the last 168 hours. No results found for this or any previous visit (from the past 240 hours).   Studies: CT ABDOMEN PELVIS W CONTRAST Result Date: 12/07/2023 CLINICAL DATA:  Peritonitis. EXAM: CT ABDOMEN AND PELVIS WITH CONTRAST TECHNIQUE: Multidetector CT imaging of the abdomen and pelvis was performed using the standard protocol following bolus administration of intravenous contrast. RADIATION DOSE REDUCTION: This exam was performed according to the departmental dose-optimization program which includes automated exposure control, adjustment of the mA and/or kV according to patient size and/or use of iterative reconstruction technique. CONTRAST:  75mL OMNIPAQUE IOHEXOL  350 MG/ML SOLN COMPARISON:  CT 03/01/2020. Chest abdomen x-ray series earlier 12/07/2023 FINDINGS: Lower chest: Mild linear opacity lung bases likely scar or atelectasis. No pleural effusion. Small hiatal hernia. Hepatobiliary: Previous cholecystectomy. There is slight ectasia of the biliary tree but normal tapering of the common duct towards the pancreatic head. Slightly more pronounced than previous. Preserved hepatic parenchyma. Patent portal vein. Pancreas: Unremarkable. No pancreatic ductal dilatation or surrounding inflammatory changes. Spleen: Normal in size without focal abnormality. Adrenals/Urinary Tract: The adrenal glands are preserved. Bosniak 1 lower pole right-sided dominant renal cyst seen measuring 6.7 cm in diameter. Hounsfield unit of 10. Tiny other cystic lesions identified in each kidney. There is 1 lesion exophytic from the midportion of the right kidney which appears complex. This is a small lesion measuring 9 mm on series 3, image 28. This was a tiny focus in 2021. This is more indeterminate lesion. Recommend either six-month follow-up study or MRI as clinically appropriate. Preserved contour to the urinary bladder. Stomach/Bowel: Surgical changes along the rectum.  The large bowel is nondilated. Scattered colonic diverticula identified along the descending and sigmoid colon. There is significant wall thickening along the mid sigmoid colon with severe stranding. There are some metallic clips in this location. Please correlate with provided history of recent colonoscopy. There is significant adjacent free air extending superior to this portion of the colon. Additional scattered areas of free air seen scattered throughout the mesentery and up by the level of the diaphragm as well consistent with perforation. The appendix has a normal caliber in the right lower quadrant. The stomach and small bowel are nondilated. Vascular/Lymphatic: No significant vascular findings are present. No enlarged  abdominal or pelvic lymph nodes. Reproductive: Status post hysterectomy. No adnexal masses. Other: Small amount of free fluid in the pelvis. Again scattered free air, central mesenteric stranding. Musculoskeletal: Curvature of the lumbar spine with moderate degenerative changes. Pars defects at L5 with grade 2 anterolisthesis of L5 on S1. Multilevel disc bulging and stenosis along the spine. Critical Value/emergent results were called by telephone at the time of interpretation on 12/07/2023 at 4:54 pm to provider Dr. Wilkie Aye, who verbally acknowledged these results. IMPRESSION: Free air identified greatest surrounding the mid sigmoid colon where there is wall thickening, stranding and some clips. Please correlate for focal bowel perforation of the colon. No associated bowel obstruction. No extraluminal rim enhancing fluid collection suggested. Scattered stranding and trace simple free fluid in the pelvis. Previous cholecystectomy. Small hiatal hernia. Surgical changes along the rectum. Small slightly complex cystic lesion along the right mid kidney. Too small to completely characterize. Recommend simple follow-up CT in 6 months with without contrast. If there is more clinical concern at this time a follow up MRI could be considered when clinically appropriate. Electronically Signed   By: [name] Kays M.D.   On: 12/07/2023 16:56   DG Abdomen Acute W/Chest Result Date: 12/07/2023 CLINICAL DATA:  Bowel perforation. EXAM: DG ABDOMEN ACUTE WITH 1 VIEW CHEST COMPARISON:  Chest x-ray 09/02/2016 FINDINGS: Underinflation. No consolidation, pneumothorax or effusion. No edema. Normal cardiopericardial silhouette. Surgical clips in the right axillary region. Overlapping cardiac leads. Degenerative changes of the spine and shoulders. Metallic foci along the pelvis, possible clips. There is also presumed cholecystectomy clips in the right upper quadrant. There is gaseous distention of loops of colon the mid abdomen. Few mildly  dilated loops of small bowel left lower quadrant. There appears to be some free air beneath the diaphragm. Please see separate CT scan from same day. Moderate degenerative changes of the spine with curvature. Presumed vascular calcifications in the pelvis. IMPRESSION: Nonspecific bowel gas pattern with a few distended loops of air-filled small and large bowel. There does appear to be free air however beneath the diaphragm and recommend correlation with the separate CT scan from same day. Underinflation of the lungs.  No consolidation or effusion. Metallic clips in the central pelvis and right upper quadrant of the abdomen. Electronically Signed   By: [name] Kays M.D.   On: 12/07/2023 16:48      Joycelyn Das, MD  [facility] 12/08/2023  If 7PM-7AM, please contact night-coverage

## 2023-12-08 NOTE — Plan of Care (Signed)

## 2023-12-08 NOTE — Evaluation (Signed)
 Physical Therapy Evaluation Patient Details Name: Meghan Avila MRN: [id number] DOB: 1951-05-29 Today's Date: 12/08/2023  History of Present Illness  73 y.o. female admitted 3/3 after routine colonoscopy noted colon perf s/p ex lap same date. PMhx: HLD, OSA on CPAP, GERD, PVD, back sx, bil meniscus repairs  Clinical Impression  Pt pleasant and initially reporting nausea, had received anti-emetic. Pt normally independent without AD, sister living with her until April and can assist as needed. Pt limited by pain and nausea but able to get up and walk in room. Pt with decreased activity tolerance and balance who will benefit from acute therapy to maximize mobility and safety to return to PLOF.         If plan is discharge home, recommend the following: A little help with walking and/or transfers;A little help with bathing/dressing/bathroom;Assistance with cooking/housework   Can travel by private vehicle        Equipment Recommendations None recommended by PT  Recommendations for Other Services       Functional Status Assessment Patient has had a recent decline in their functional status and demonstrates the ability to make significant improvements in function in a reasonable and predictable amount of time.     Precautions / Restrictions Precautions Precautions: Fall;Other (comment) Recall of Precautions/Restrictions: Intact Precaution/Restrictions Comments: jp drain      Mobility  Bed Mobility Overal bed mobility: Needs Assistance Bed Mobility: Supine to Sit     Supine to sit: HOB elevated, Min assist     General bed mobility comments: min assist to pivot to EOB, HOB elevated due to nausea    Transfers Overall transfer level: Needs assistance   Transfers: Sit to/from Stand Sit to Stand: Min assist           General transfer comment: min assist to rise with cues for splinting abdomen    Ambulation/Gait Ambulation/Gait assistance: Min assist Gait Distance  (Feet): 20 Feet Assistive device: 1 person hand held assist Gait Pattern/deviations: Step-through pattern, Decreased stride length   Gait velocity interpretation: 1.31 - 2.62 ft/sec, indicative of limited community ambulator   General Gait Details: steady gait with reliance on single UE support, limited by fatigue and pain  Stairs            Wheelchair Mobility     Tilt Bed    Modified Rankin (Stroke Patients Only)       Balance Overall balance assessment: Needs assistance   Sitting balance-Leahy Scale: Normal     Standing balance support: Single extremity supported Standing balance-Leahy Scale: Fair                               Pertinent Vitals/Pain Pain Assessment Pain Assessment: 0-10 Pain Score: 3  Pain Location: abdomen, limited by nausea Pain Descriptors / Indicators: Sore Pain Intervention(s): Limited activity within patient's tolerance, Premedicated before session, Repositioned    Home Living Family/patient expects to be discharged to:: Private residence Living Arrangements: Other relatives Available Help at Discharge: Family;Available 24 hours/day Type of Home: House Home Access: Stairs to enter Entrance Stairs-Rails: Right Entrance Stairs-Number of Steps: 5   Home Layout: Two level;Able to live on main level with bedroom/bathroom Home Equipment: Rolling Walker (2 wheels);BSC/3in1      Prior Function Prior Level of Function : Independent/Modified Independent;Driving                     Extremity/Trunk Assessment   Upper  Extremity Assessment Upper Extremity Assessment: Overall WFL for tasks assessed    Lower Extremity Assessment Lower Extremity Assessment: Overall WFL for tasks assessed    Cervical / Trunk Assessment Cervical / Trunk Assessment: Normal  Communication   Communication Communication: No apparent difficulties    Cognition Arousal: Alert Behavior During Therapy: WFL for tasks assessed/performed    PT - Cognitive impairments: No apparent impairments                         Following commands: Intact       Cueing Cueing Techniques: Verbal cues     General Comments      Exercises     Assessment/Plan    PT Assessment Patient needs continued PT services  PT Problem List         PT Treatment Interventions Gait training;Functional mobility training;Patient/family education;Therapeutic activities;Therapeutic exercise;Stair training;Balance training    PT Goals (Current goals can be found in the Care Plan section)  Acute Rehab PT Goals Patient Stated Goal: return home PT Goal Formulation: With patient Time For Goal Achievement: 12/22/23 Potential to Achieve Goals: Good    Frequency Min 2X/week     Co-evaluation               AM-PAC PT "6 Clicks" Mobility  Outcome Measure Help needed turning from your back to your side while in a flat bed without using bedrails?: A Little Help needed moving from lying on your back to sitting on the side of a flat bed without using bedrails?: A Little Help needed moving to and from a bed to a chair (including a wheelchair)?: A Little Help needed standing up from a chair using your arms (e.g., wheelchair or bedside chair)?: A Little Help needed to walk in hospital room?: A Little Help needed climbing 3-5 steps with a railing? : A Lot 6 Click Score: 17    End of Session   Activity Tolerance: Patient tolerated treatment well Patient left: in chair;with call bell/phone within reach (no chair alarm pads on unit) Nurse Communication: Mobility status PT Visit Diagnosis: Other abnormalities of gait and mobility (R26.89);Difficulty in walking, not elsewhere classified (R26.2)    Time: 1610-9604 PT Time Calculation (min) (ACUTE ONLY): 25 min   Charges:   PT Evaluation $PT Eval Moderate Complexity: 1 Mod PT Treatments $Therapeutic Activity: 8-22 mins PT General Charges $$ ACUTE PT VISIT: 1 Visit         Merryl Hacker, PT Acute Rehabilitation Services Office: [phone]   Enedina Finner Kathia Covington 12/08/2023, 12:11 PM

## 2023-12-08 NOTE — Progress Notes (Signed)
 [facility] Progress Note  SUBJECTIVE:   Interval history: Meghan Avila was seen and evaluated today at bedside. Resting in bed. Noted having some nausea this AM after taking pills. Pain is well controlled. No chest pain or shortness of breath. Has not passed flatus nor BM since surgery overnight.   Past Medical History:  Diagnosis Date   Arthritis    Family history of adverse reaction to anesthesia    post nausea and vomitting   GERD (gastroesophageal reflux disease)    hx of   H/O wheezing    occ.   Hand swelling    related to lymph nodes    Headache    migraines   History of hiatal hernia    Leg swelling    left leg -edema knee level to ankle.   Osteoporosis    Peripheral vascular disease (HCC)    left leg    PONV (postoperative nausea and vomiting)    Scoliosis    Vertigo    Past Surgical History:  Procedure Laterality Date   ANAL RECTAL MANOMETRY N/A 08/03/2019   Procedure: ANO RECTAL MANOMETRY;  Surgeon: Romie Levee, MD;  Location: [facility];  Service: Endoscopy;  Laterality: N/A;   BACK SURGERY     scoliosis surgery 1970 and 1972   BREAST REDUCTION SURGERY  1997   CHOLECYSTECTOMY  1992   laparoscopic   COLONOSCOPY WITH PROPOFOL N/A 04/19/2015   Procedure: COLONOSCOPY WITH PROPOFOL;  Surgeon: Charolett Bumpers, MD;  Location: [facility];  Service: Endoscopy;  Laterality: N/A;   DEEP AXILLARY SENTINEL NODE BIOPSY / EXCISION     x2   ESOPHAGEAL MANOMETRY N/A 05/04/2015   Procedure: ESOPHAGEAL MANOMETRY (EM);  Surgeon: Charolett Bumpers, MD;  Location: [facility];  Service: Endoscopy;  Laterality: N/A;   ESOPHAGOGASTRODUODENOSCOPY (EGD) WITH PROPOFOL N/A 04/19/2015   Procedure: ESOPHAGOGASTRODUODENOSCOPY (EGD) WITH PROPOFOL;  Surgeon: Charolett Bumpers, MD;  Location: [facility];  Service: Endoscopy;  Laterality: N/A;   HEMORROIDECTOMY  2002, 2005   hiatal hernia surgery      JOINT REPLACEMENT     total right knee Dr. Thomasena Edis 02-26-18    LAPAROTOMY N/A 12/07/2023   Procedure: EXPLORATORY LAPAROTOMY;  Surgeon: Fritzi Mandes, MD;  Location: [facility];  Service: General;  Laterality: N/A;   LASER ABLATION  2008   left leg    left knee surgery - torn meniscus (left)     LIPOMA EXCISION N/A 09/21/2015   Procedure: EXCISION 6CM BACK LIPOMA;  Surgeon: Axel Filler, MD;  Location: [facility];  Service: General;  Laterality: N/A;   MENISCUS REPAIR     right   REDUCTION MAMMAPLASTY Bilateral 1998   right shoulder rotator cuff surgery      ROBOTIC ASSISTED LAPAROSCOPIC SACROCOLPOPEXY N/A 05/30/2021   Procedure: XI ROBOTIC ASSISTED LAPAROSCOPIC SACROCOLPOPEXY;  Surgeon: Crist Fat, MD;  Location: [facility];  Service: Urology;  Laterality: N/A;   scoliosis  1970, 1972   no retained hardware   TOTAL KNEE ARTHROPLASTY Right 02/26/2018   Procedure: RIGHT TOTAL KNEE ARTHROPLASTY;  Surgeon: Eugenia Mcalpine, MD;  Location: [facility];  Service: Orthopedics;  Laterality: Right;   Current Facility-Administered Medications  Medication Dose Route Frequency Provider Last Rate Last Admin   acetaminophen (TYLENOL) tablet 1,000 mg  1,000 mg Oral TID Fritzi Mandes, MD   1,000 mg at 12/08/23 0859   amitriptyline (ELAVIL) tablet 10 mg  10 mg Oral QHS Emelia Loron, MD       dextrose  5 % and 0.45 % NaCl infusion   Intravenous Continuous Fritzi Mandes, MD 40 mL/hr at 12/07/23 2330 New Bag at 12/07/23 2330   diphenhydrAMINE (BENADRYL) 12.5 MG/5ML elixir 12.5 mg  12.5 mg Oral Q6H PRN Fritzi Mandes, MD       Or   diphenhydrAMINE (BENADRYL) injection 12.5 mg  12.5 mg Intravenous Q6H PRN Fritzi Mandes, MD       enoxaparin (LOVENOX) injection 40 mg  40 mg Subcutaneous Q24H Sophronia Simas L, MD   40 mg at 12/08/23 0900   HYDROmorphone (DILAUDID) injection 0.5 mg  0.5 mg Intravenous Q3H PRN Sophronia Simas L, MD   0.5 mg at 12/08/23 0042   methocarbamol (ROBAXIN) injection 500 mg  500 mg Intravenous Veryl Speak, MD   500 mg at 12/08/23 0900    metoprolol tartrate (LOPRESSOR) tablet 25 mg  25 mg Oral BID Emelia Loron, MD   25 mg at 12/08/23 0859   ondansetron (ZOFRAN) tablet 4 mg  4 mg Oral Q6H PRN Fritzi Mandes, MD   4 mg at 12/08/23 0912   Or   ondansetron (ZOFRAN) injection 4 mg  4 mg Intravenous Q6H PRN Fritzi Mandes, MD       oxyCODONE (Oxy IR/ROXICODONE) immediate release tablet 5-10 mg  5-10 mg Oral Q4H PRN Fritzi Mandes, MD   5 mg at 12/08/23 0858   piperacillin-tazobactam (ZOSYN) IVPB 3.375 g  3.375 g Intravenous Q8H Doutova, Anastassia, MD 12.5 mL/hr at 12/08/23 0903 3.375 g at 12/08/23 0903   potassium chloride (KLOR-CON) packet 40 mEq  40 mEq Oral Once Emelia Loron, MD       Allergies as of 12/07/2023 - Review Complete 12/07/2023  Allergen Reaction Noted   Zantac [ranitidine hcl] Anaphylaxis 02/05/2015   Alendronate  11/04/2023   Morphine and codeine Nausea And Vomiting 02/05/2015   Prednisone Diarrhea and Nausea And Vomiting 08/11/2011   Ranitidine Swelling 11/04/2023   Tramadol Nausea And Vomiting 02/05/2015   Review of Systems:  Review of Systems  Respiratory:  Negative for shortness of breath.   Cardiovascular:  Negative for chest pain.  Gastrointestinal:  Positive for abdominal pain and nausea. Negative for vomiting.    OBJECTIVE:   Temp:  [97.2 F (36.2 C)-99.5 F (37.5 C)] 97.2 F (36.2 C) (03/04 0750) Pulse Rate:  [68-100] 76 (03/04 0750) Resp:  [15-21] 15 (03/04 0750) BP: (92-160)/(58-91) 99/80 (03/04 0750) SpO2:  [90 %-100 %] 95 % (03/04 0750) Weight:  [65.8 kg-67.3 kg] 67.3 kg (03/04 0500)   Physical Exam Constitutional:      General: She is not in acute distress.    Appearance: She is not ill-appearing, toxic-appearing or diaphoretic.  Cardiovascular:     Rate and Rhythm: Normal rate and regular rhythm.  Pulmonary:     Effort: No respiratory distress.     Breath sounds: Normal breath sounds.  Abdominal:     General: Bowel sounds are normal. There is no distension.      Palpations: Abdomen is soft.     Tenderness: There is no abdominal tenderness. There is no guarding.     Comments: Incision covered with clean/dry/intact dressing.  Neurological:     Mental Status: She is alert.     Labs: Recent Labs    12/07/23 1254 12/08/23 0700  WBC 3.7* 11.3*  HGB 13.2 10.6*  HCT 41.7 33.0*  PLT 215 152   BMET Recent Labs    12/07/23 1254 12/07/23 2340 12/08/23 0700  NA 141 141 138  K 3.4* 3.5 3.1*  CL 104 109 107  CO2 23 20* 22  GLUCOSE 90 154* 172*  BUN 7* 7* 9  CREATININE 0.64 0.66 0.63  CALCIUM 9.1 7.8* 7.7*   LFT Recent Labs    12/07/23 1254  PROT 6.5  ALBUMIN 3.9  AST 24  ALT 17  ALKPHOS 54  BILITOT 0.5   PT/INR No results for input(s): "LABPROT", "INR" in the last 72 hours. Diagnostic imaging: CT ABDOMEN PELVIS W CONTRAST Result Date: 12/07/2023 CLINICAL DATA:  Peritonitis. EXAM: CT ABDOMEN AND PELVIS WITH CONTRAST TECHNIQUE: Multidetector CT imaging of the abdomen and pelvis was performed using the standard protocol following bolus administration of intravenous contrast. RADIATION DOSE REDUCTION: This exam was performed according to the departmental dose-optimization program which includes automated exposure control, adjustment of the mA and/or kV according to patient size and/or use of iterative reconstruction technique. CONTRAST:  75mL OMNIPAQUE IOHEXOL 350 MG/ML SOLN COMPARISON:  CT 03/01/2020. Chest abdomen x-ray series earlier 12/07/2023 FINDINGS: Lower chest: Mild linear opacity lung bases likely scar or atelectasis. No pleural effusion. Small hiatal hernia. Hepatobiliary: Previous cholecystectomy. There is slight ectasia of the biliary tree but normal tapering of the common duct towards the pancreatic head. Slightly more pronounced than previous. Preserved hepatic parenchyma. Patent portal vein. Pancreas: Unremarkable. No pancreatic ductal dilatation or surrounding inflammatory changes. Spleen: Normal in size without focal  abnormality. Adrenals/Urinary Tract: The adrenal glands are preserved. Bosniak 1 lower pole right-sided dominant renal cyst seen measuring 6.7 cm in diameter. Hounsfield unit of 10. Tiny other cystic lesions identified in each kidney. There is 1 lesion exophytic from the midportion of the right kidney which appears complex. This is a small lesion measuring 9 mm on series 3, image 28. This was a tiny focus in 2021. This is more indeterminate lesion. Recommend either six-month follow-up study or MRI as clinically appropriate. Preserved contour to the urinary bladder. Stomach/Bowel: Surgical changes along the rectum. The large bowel is nondilated. Scattered colonic diverticula identified along the descending and sigmoid colon. There is significant wall thickening along the mid sigmoid colon with severe stranding. There are some metallic clips in this location. Please correlate with provided history of recent colonoscopy. There is significant adjacent free air extending superior to this portion of the colon. Additional scattered areas of free air seen scattered throughout the mesentery and up by the level of the diaphragm as well consistent with perforation. The appendix has a normal caliber in the right lower quadrant. The stomach and small bowel are nondilated. Vascular/Lymphatic: No significant vascular findings are present. No enlarged abdominal or pelvic lymph nodes. Reproductive: Status post hysterectomy. No adnexal masses. Other: Small amount of free fluid in the pelvis. Again scattered free air, central mesenteric stranding. Musculoskeletal: Curvature of the lumbar spine with moderate degenerative changes. Pars defects at L5 with grade 2 anterolisthesis of L5 on S1. Multilevel disc bulging and stenosis along the spine. Critical Value/emergent results were called by telephone at the time of interpretation on 12/07/2023 at 4:54 pm to provider Dr. Wilkie Aye, who verbally acknowledged these results. IMPRESSION: Free air  identified greatest surrounding the mid sigmoid colon where there is wall thickening, stranding and some clips. Please correlate for focal bowel perforation of the colon. No associated bowel obstruction. No extraluminal rim enhancing fluid collection suggested. Scattered stranding and trace simple free fluid in the pelvis. Previous cholecystectomy. Small hiatal hernia. Surgical changes along the rectum. Small slightly complex cystic lesion along the right  mid kidney. Too small to completely characterize. Recommend simple follow-up CT in 6 months with without contrast. If there is more clinical concern at this time a follow up MRI could be considered when clinically appropriate. Electronically Signed   By: [name] Kays M.D.   On: 12/07/2023 16:56   DG Abdomen Acute W/Chest Result Date: 12/07/2023 CLINICAL DATA:  Bowel perforation. EXAM: DG ABDOMEN ACUTE WITH 1 VIEW CHEST COMPARISON:  Chest x-ray 09/02/2016 FINDINGS: Underinflation. No consolidation, pneumothorax or effusion. No edema. Normal cardiopericardial silhouette. Surgical clips in the right axillary region. Overlapping cardiac leads. Degenerative changes of the spine and shoulders. Metallic foci along the pelvis, possible clips. There is also presumed cholecystectomy clips in the right upper quadrant. There is gaseous distention of loops of colon the mid abdomen. Few mildly dilated loops of small bowel left lower quadrant. There appears to be some free air beneath the diaphragm. Please see separate CT scan from same day. Moderate degenerative changes of the spine with curvature. Presumed vascular calcifications in the pelvis. IMPRESSION: Nonspecific bowel gas pattern with a few distended loops of air-filled small and large bowel. There does appear to be free air however beneath the diaphragm and recommend correlation with the separate CT scan from same day. Underinflation of the lungs.  No consolidation or effusion. Metallic clips in the central pelvis and  right upper quadrant of the abdomen. Electronically Signed   By: [name] Kays M.D.   On: 12/07/2023 16:48   IMPRESSION: Colonic perforation in rectosigmoid colon -Outpatient diagnostic colonoscopy 12/07/2023 for personal history of colon polyps -POD#1 s/p colorraphy  Abdominal pain secondary to above History of left-sided diverticular disease History of internal and external hemorrhoids  PLAN: -General Surgery management pain medications, diet  -[facility] will follow   LOS: 1 day   Liliane Shi, [facility]

## 2023-12-09 DIAGNOSIS — K631 Perforation of intestine (nontraumatic): Secondary | ICD-10-CM | POA: Diagnosis not present

## 2023-12-09 LAB — BASIC METABOLIC PANEL
Anion gap: 5 (ref 5–15)
BUN: 15 mg/dL (ref 8–23)
CO2: 24 mmol/L (ref 22–32)
Calcium: 7.4 mg/dL — ABNORMAL LOW (ref 8.9–10.3)
Chloride: 107 mmol/L (ref 98–111)
Creatinine, Ser: 0.86 mg/dL (ref 0.44–1.00)
GFR, Estimated: >60 mL/min (ref >60)
Glucose, Bld: 97 mg/dL (ref 70–99)
Potassium: 2.7 mmol/L — CL (ref 3.5–5.1)
Sodium: 136 mmol/L (ref 135–145)

## 2023-12-09 LAB — CBC
HCT: 28.9 % — ABNORMAL LOW (ref 36.0–46.0)
Hemoglobin: 9.4 g/dL — ABNORMAL LOW (ref 12.0–15.0)
MCH: 26.5 pg (ref 26.0–34.0)
MCHC: 32.5 g/dL (ref 30.0–36.0)
MCV: 81.4 fL (ref 80.0–100.0)
Platelets: 162 10*3/uL (ref 150–400)
RBC: 3.55 MIL/uL — ABNORMAL LOW (ref 3.87–5.11)
RDW: 15.8 % — ABNORMAL HIGH (ref 11.5–15.5)
WBC: 13.2 10*3/uL — ABNORMAL HIGH (ref 4.0–10.5)
nRBC: 0 % (ref 0.0–0.2)

## 2023-12-09 LAB — MAGNESIUM: Magnesium: 2.4 mg/dL (ref 1.7–2.4)

## 2023-12-09 MED ORDER — KCL-LACTATED RINGERS-D5W 20 MEQ/L IV SOLN
INTRAVENOUS | Status: DC
  Filled 2023-12-09 (×2): qty 1000

## 2023-12-09 MED ORDER — KCL IN DEXTROSE-NACL 20-5-0.9 MEQ/L-%-% IV SOLN
INTRAVENOUS | Status: DC
  Filled 2023-12-09: qty 1000

## 2023-12-09 MED ORDER — POTASSIUM CHLORIDE 10 MEQ/100ML IV SOLN
10.0000 meq | INTRAVENOUS | Status: AC
  Administered 2023-12-09 (×4): 10 meq via INTRAVENOUS
  Filled 2023-12-09 (×4): qty 100

## 2023-12-09 NOTE — Progress Notes (Signed)
 [facility] Progress Note  SUBJECTIVE:   Interval history: Meghan Avila was seen and evaluated today at bedside.  Resting comfortably in bed.  Noted having flatus.  Had small amount of sediment per rectum.  Tolerating liquids.  Pain well-controlled.  No further nausea.  Past Medical History:  Diagnosis Date   Arthritis    Family history of adverse reaction to anesthesia    post nausea and vomitting   GERD (gastroesophageal reflux disease)    hx of   H/O wheezing    occ.   Hand swelling    related to lymph nodes    Headache    migraines   History of hiatal hernia    Leg swelling    left leg -edema knee level to ankle.   Osteoporosis    Peripheral vascular disease (HCC)    left leg    PONV (postoperative nausea and vomiting)    Scoliosis    Vertigo    Past Surgical History:  Procedure Laterality Date   ANAL RECTAL MANOMETRY N/A 08/03/2019   Procedure: ANO RECTAL MANOMETRY;  Surgeon: Romie Levee, MD;  Location: [facility];  Service: Endoscopy;  Laterality: N/A;   BACK SURGERY     scoliosis surgery 1970 and 1972   BREAST REDUCTION SURGERY  1997   CHOLECYSTECTOMY  1992   laparoscopic   COLONOSCOPY WITH PROPOFOL N/A 04/19/2015   Procedure: COLONOSCOPY WITH PROPOFOL;  Surgeon: Charolett Bumpers, MD;  Location: [facility];  Service: Endoscopy;  Laterality: N/A;   DEEP AXILLARY SENTINEL NODE BIOPSY / EXCISION     x2   ESOPHAGEAL MANOMETRY N/A 05/04/2015   Procedure: ESOPHAGEAL MANOMETRY (EM);  Surgeon: Charolett Bumpers, MD;  Location: [facility];  Service: Endoscopy;  Laterality: N/A;   ESOPHAGOGASTRODUODENOSCOPY (EGD) WITH PROPOFOL N/A 04/19/2015   Procedure: ESOPHAGOGASTRODUODENOSCOPY (EGD) WITH PROPOFOL;  Surgeon: Charolett Bumpers, MD;  Location: [facility];  Service: Endoscopy;  Laterality: N/A;   HEMORROIDECTOMY  2002, 2005   hiatal hernia surgery      JOINT REPLACEMENT     total right knee Dr. Thomasena Edis 02-26-18   LAPAROTOMY N/A 12/07/2023    Procedure: EXPLORATORY LAPAROTOMY;  Surgeon: Fritzi Mandes, MD;  Location: [facility];  Service: General;  Laterality: N/A;   LASER ABLATION  2008   left leg    left knee surgery - torn meniscus (left)     LIPOMA EXCISION N/A 09/21/2015   Procedure: EXCISION 6CM BACK LIPOMA;  Surgeon: Axel Filler, MD;  Location: [facility];  Service: General;  Laterality: N/A;   MENISCUS REPAIR     right   REDUCTION MAMMAPLASTY Bilateral 1998   right shoulder rotator cuff surgery      ROBOTIC ASSISTED LAPAROSCOPIC SACROCOLPOPEXY N/A 05/30/2021   Procedure: XI ROBOTIC ASSISTED LAPAROSCOPIC SACROCOLPOPEXY;  Surgeon: Crist Fat, MD;  Location: [facility];  Service: Urology;  Laterality: N/A;   scoliosis  1970, 1972   no retained hardware   TOTAL KNEE ARTHROPLASTY Right 02/26/2018   Procedure: RIGHT TOTAL KNEE ARTHROPLASTY;  Surgeon: Eugenia Mcalpine, MD;  Location: [facility];  Service: Orthopedics;  Laterality: Right;   Current Facility-Administered Medications  Medication Dose Route Frequency Provider Last Rate Last Admin   acetaminophen (TYLENOL) tablet 1,000 mg  1,000 mg Oral TID Fritzi Mandes, MD   1,000 mg at 12/09/23 0916   amitriptyline (ELAVIL) tablet 10 mg  10 mg Oral Benjamin Stain, MD   10 mg at 12/08/23 2152   atorvastatin (LIPITOR) tablet 10  mg  10 mg Oral q AM Pokhrel, Laxman, MD   10 mg at 12/09/23 0636   dextrose 5% in lactated ringers with KCl 20 mEq/L infusion   Intravenous Continuous Emelia Loron, MD 75 mL/hr at 12/09/23 0916 New Bag at 12/09/23 0916   diphenhydrAMINE (BENADRYL) 12.5 MG/5ML elixir 12.5 mg  12.5 mg Oral Q6H PRN Fritzi Mandes, MD       Or   diphenhydrAMINE (BENADRYL) injection 12.5 mg  12.5 mg Intravenous Q6H PRN Fritzi Mandes, MD       enoxaparin (LOVENOX) injection 40 mg  40 mg Subcutaneous Q24H Sophronia Simas L, MD   40 mg at 12/09/23 1610   HYDROmorphone (DILAUDID) injection 0.5 mg  0.5 mg Intravenous Q3H PRN Sophronia Simas L, MD   0.5 mg at 12/08/23 0042    methocarbamol (ROBAXIN) injection 500 mg  500 mg Intravenous Veryl Speak, MD   500 mg at 12/09/23 9604   metoprolol tartrate (LOPRESSOR) tablet 25 mg  25 mg Oral BID Emelia Loron, MD   25 mg at 12/09/23 0917   ondansetron (ZOFRAN) tablet 4 mg  4 mg Oral Q6H PRN Fritzi Mandes, MD   4 mg at 12/09/23 1024   Or   ondansetron [facility]) injection 4 mg  4 mg Intravenous Q6H PRN Fritzi Mandes, MD   4 mg at 12/08/23 1448   oxyCODONE (Oxy IR/ROXICODONE) immediate release tablet 5-10 mg  5-10 mg Oral Q4H PRN Fritzi Mandes, MD   5 mg at 12/09/23 1024   piperacillin-tazobactam (ZOSYN) IVPB 3.375 g  3.375 g Intravenous Q8H Doutova, Anastassia, MD 12.5 mL/hr at 12/09/23 0915 3.375 g at 12/09/23 0915   potassium chloride 10 mEq in 100 mL IVPB  10 mEq Intravenous Q1 Hr x 4 Pokhrel, Laxman, MD       Allergies as of 12/07/2023 - Review Complete 12/07/2023  Allergen Reaction Noted   Zantac [ranitidine hcl] Anaphylaxis 02/05/2015   Alendronate  11/04/2023   Morphine and codeine Nausea And Vomiting 02/05/2015   Prednisone Diarrhea and Nausea And Vomiting 08/11/2011   Ranitidine Swelling 11/04/2023   Tramadol Nausea And Vomiting 02/05/2015   Review of Systems:  Review of Systems  Respiratory:  Negative for shortness of breath.   Cardiovascular:  Negative for chest pain.  Gastrointestinal:  Positive for abdominal pain. Negative for nausea and vomiting.    OBJECTIVE:   Temp:  [97.5 F (36.4 C)-98.5 F (36.9 C)] 98.5 F (36.9 C) (03/05 0803) Pulse Rate:  [52-79] 71 (03/05 0803) Resp:  [18-19] 18 (03/05 0444) BP: (86-112)/(46-75) 110/49 (03/05 0803) SpO2:  [90 %-97 %] 91 % (03/05 0803) Weight:  [68.1 kg] 68.1 kg (03/05 0759)   Physical Exam Constitutional:      General: She is not in acute distress.    Appearance: She is not ill-appearing, toxic-appearing or diaphoretic.  Cardiovascular:     Rate and Rhythm: Normal rate and regular rhythm.  Pulmonary:     Effort: No  respiratory distress.     Breath sounds: Normal breath sounds.  Abdominal:     General: Bowel sounds are normal. There is no distension.     Palpations: Abdomen is soft.     Tenderness: There is abdominal tenderness. There is no guarding.  Skin:    General: Skin is warm and dry.     Comments: Abdominal incision covered with clean/dry/intact dressing.  Neurological:     Mental Status: She is alert.     Labs: Recent  Labs    12/07/23 1254 12/08/23 0700 12/09/23 0725  WBC 3.7* 11.3* 13.2*  HGB 13.2 10.6* 9.4*  HCT 41.7 33.0* 28.9*  PLT 215 152 162   BMET Recent Labs    12/07/23 2340 12/08/23 0700 12/09/23 0725  NA 141 138 136  K 3.5 3.1* 2.7*  CL 109 107 107  CO2 20* 22 24  GLUCOSE 154* 172* 97  BUN 7* 9 15  CREATININE 0.66 0.63 0.86  CALCIUM 7.8* 7.7* 7.4*   LFT Recent Labs    12/07/23 1254  PROT 6.5  ALBUMIN 3.9  AST 24  ALT 17  ALKPHOS 54  BILITOT 0.5   PT/INR No results for input(s): "LABPROT", "INR" in the last 72 hours. Diagnostic imaging: CT ABDOMEN PELVIS W CONTRAST Result Date: 12/07/2023 CLINICAL DATA:  Peritonitis. EXAM: CT ABDOMEN AND PELVIS WITH CONTRAST TECHNIQUE: Multidetector CT imaging of the abdomen and pelvis was performed using the standard protocol following bolus administration of intravenous contrast. RADIATION DOSE REDUCTION: This exam was performed according to the departmental dose-optimization program which includes automated exposure control, adjustment of the mA and/or kV according to patient size and/or use of iterative reconstruction technique. CONTRAST:  75mL OMNIPAQUE IOHEXOL 350 MG/ML SOLN COMPARISON:  CT 03/01/2020. Chest abdomen x-ray series earlier 12/07/2023 FINDINGS: Lower chest: Mild linear opacity lung bases likely scar or atelectasis. No pleural effusion. Small hiatal hernia. Hepatobiliary: Previous cholecystectomy. There is slight ectasia of the biliary tree but normal tapering of the common duct towards the pancreatic head.  Slightly more pronounced than previous. Preserved hepatic parenchyma. Patent portal vein. Pancreas: Unremarkable. No pancreatic ductal dilatation or surrounding inflammatory changes. Spleen: Normal in size without focal abnormality. Adrenals/Urinary Tract: The adrenal glands are preserved. Bosniak 1 lower pole right-sided dominant renal cyst seen measuring 6.7 cm in diameter. Hounsfield unit of 10. Tiny other cystic lesions identified in each kidney. There is 1 lesion exophytic from the midportion of the right kidney which appears complex. This is a small lesion measuring 9 mm on series 3, image 28. This was a tiny focus in 2021. This is more indeterminate lesion. Recommend either six-month follow-up study or MRI as clinically appropriate. Preserved contour to the urinary bladder. Stomach/Bowel: Surgical changes along the rectum. The large bowel is nondilated. Scattered colonic diverticula identified along the descending and sigmoid colon. There is significant wall thickening along the mid sigmoid colon with severe stranding. There are some metallic clips in this location. Please correlate with provided history of recent colonoscopy. There is significant adjacent free air extending superior to this portion of the colon. Additional scattered areas of free air seen scattered throughout the mesentery and up by the level of the diaphragm as well consistent with perforation. The appendix has a normal caliber in the right lower quadrant. The stomach and small bowel are nondilated. Vascular/Lymphatic: No significant vascular findings are present. No enlarged abdominal or pelvic lymph nodes. Reproductive: Status post hysterectomy. No adnexal masses. Other: Small amount of free fluid in the pelvis. Again scattered free air, central mesenteric stranding. Musculoskeletal: Curvature of the lumbar spine with moderate degenerative changes. Pars defects at L5 with grade 2 anterolisthesis of L5 on S1. Multilevel disc bulging and  stenosis along the spine. Critical Value/emergent results were called by telephone at the time of interpretation on 12/07/2023 at 4:54 pm to provider Dr. Wilkie Aye, who verbally acknowledged these results. IMPRESSION: Free air identified greatest surrounding the mid sigmoid colon where there is wall thickening, stranding and some clips. Please correlate for  focal bowel perforation of the colon. No associated bowel obstruction. No extraluminal rim enhancing fluid collection suggested. Scattered stranding and trace simple free fluid in the pelvis. Previous cholecystectomy. Small hiatal hernia. Surgical changes along the rectum. Small slightly complex cystic lesion along the right mid kidney. Too small to completely characterize. Recommend simple follow-up CT in 6 months with without contrast. If there is more clinical concern at this time a follow up MRI could be considered when clinically appropriate. Electronically Signed   By: [name] Kays M.D.   On: 12/07/2023 16:56   DG Abdomen Acute W/Chest Result Date: 12/07/2023 CLINICAL DATA:  Bowel perforation. EXAM: DG ABDOMEN ACUTE WITH 1 VIEW CHEST COMPARISON:  Chest x-ray 09/02/2016 FINDINGS: Underinflation. No consolidation, pneumothorax or effusion. No edema. Normal cardiopericardial silhouette. Surgical clips in the right axillary region. Overlapping cardiac leads. Degenerative changes of the spine and shoulders. Metallic foci along the pelvis, possible clips. There is also presumed cholecystectomy clips in the right upper quadrant. There is gaseous distention of loops of colon the mid abdomen. Few mildly dilated loops of small bowel left lower quadrant. There appears to be some free air beneath the diaphragm. Please see separate CT scan from same day. Moderate degenerative changes of the spine with curvature. Presumed vascular calcifications in the pelvis. IMPRESSION: Nonspecific bowel gas pattern with a few distended loops of air-filled small and large bowel. There  does appear to be free air however beneath the diaphragm and recommend correlation with the separate CT scan from same day. Underinflation of the lungs.  No consolidation or effusion. Metallic clips in the central pelvis and right upper quadrant of the abdomen. Electronically Signed   By: [name] Kays M.D.   On: 12/07/2023 16:48   IMPRESSION: Colonic perforation in rectosigmoid colon -Outpatient diagnostic colonoscopy 12/07/2023 for personal history of colon polyps -POD#2 s/p colorraphy  Abdominal pain secondary to above History of left-sided diverticular disease History of internal and external hemorrhoids  PLAN: -Appreciate general surgery management -Advance diet per general surgery -[facility] will follow   LOS: 2 days   Liliane Shi, [facility]

## 2023-12-09 NOTE — Plan of Care (Signed)

## 2023-12-09 NOTE — Progress Notes (Signed)
 PROGRESS NOTE  Meghan Avila ACZ:[id number] DOB: [birth year] DOA: 12/07/2023 PCP: Sigmund Hazel, MD   LOS: 2 days   Brief narrative:  Patient is a 73 years old female with past medical history of hyperlipidemia, obstructive sleep apnea on CPAP, GERD, history of colon polyps, peripheral vascular disease presented to the hospital abdominal pain after undergoing routine colonoscopy for surveillance for previous polyps..  During the procedure,  she was noted to have colonic perforation and closure  was attempted with 6 metallic clips GI and was sent to the ED for further care.  In the ED patient was mildly tachycardic.  Initial labs showed hypokalemia with potassium of 3.4.  CT scan of the abdomen showed thickening of the rectosigmoid colon with rounding free air.  General surgery was emergently consulted and underwent emergent exploratory laparotomy.   Assessment/Plan: Principal Problem:   Colon perforation (HCC) Active Problems:   OSA (obstructive sleep apnea)   Hypokalemia   Renal cyst  Colonic perforation.  Status post exploratory laparotomy with colorrhaphy general surgery on 12/07/2023.  General surgery on board.  Continue Zosyn.  Currently on clears.  Continue supportive care including hydration, analgesics antiemetics and supportive care.  Continue incentive spirometry.  Encourage out of bed.  PT to ambulate.  Follow surgical recommendations.  Hyperlipidemia.  On Lipitor at home.  Has been resumed.  History of peripheral vascular disease.  On Lipitor.   Obstructive sleep apnea on nocturnal CPAP.  Hypokalemia.  Potassium today low at 2.7.  Will aggressively replace with IV 40 mEq of potassium potassium.  Patient will also be on D5 LR with potassium.  Check levels in AM.  Magnesium of 2.4.  DVT prophylaxis: enoxaparin (LOVENOX) injection 40 mg Start: 12/08/23 0800 SCD's Start: 12/07/23 2158 Place TED hose Start: 12/07/23 2158   Disposition: Home likely in 1 to 2 days when okay  with general surgery.  Status is: Inpatient Remains inpatient appropriate because: Status post surgery, pending clinical improvement, significant electrolyte imbalance,    Code Status:     Code Status: Full Code  Family Communication: None at bedside  Consultants: GI and  general surgery  Procedures: Status post exploratory laparotomy with colorrhaphy on 12/07/2023  Anti-infectives:  Zosyn IV  Anti-infectives (From admission, onward)    Start     Dose/Rate Route Frequency Ordered Stop   12/08/23 0000  piperacillin-tazobactam (ZOSYN) IVPB 3.375 g  Status:  Discontinued        3.375 g 12.5 mL/hr over 240 Minutes Intravenous Every 8 hours 12/07/23 2157 12/07/23 2311   12/07/23 2330  piperacillin-tazobactam (ZOSYN) IVPB 3.375 g        3.375 g 12.5 mL/hr over 240 Minutes Intravenous Every 8 hours 12/07/23 2120     12/07/23 1345  piperacillin-tazobactam (ZOSYN) IVPB 3.375 g        3.375 g 100 mL/hr over 30 Minutes Intravenous  Once 12/07/23 1341 12/07/23 1541      Subjective: Today, patient was seen and examined at bedside.  Complains of dry heaves yesterday with mild abdominal discomfort.  Was able to pass some gas and had brown smear bowel movement.  No shortness of breath dyspnea or chest pain.    Objective: Vitals:   12/09/23 0444 12/09/23 0803  BP: 112/71 (!) 110/49  Pulse: 65 71  Resp: 18   Temp: (!) 97.5 F (36.4 C) 98.5 F (36.9 C)  SpO2: 96% 91%    Intake/Output Summary (Last 24 hours) at 12/09/2023 0902 Last data filed at 12/09/2023 0600  Gross per 24 hour  Intake 509.48 ml  Output 510 ml  Net -0.52 ml   Filed Weights   12/07/23 1242 12/08/23 0500  Weight: 65.8 kg 67.3 kg   Body mass index is 28.98 kg/m.   Physical Exam:  GENERAL: Patient is alert awake and oriented. Not in obvious distress.  On room air HENT: No scleral pallor or icterus. Pupils equally reactive to light. Oral mucosa is moist NECK: is supple, no gross swelling noted. CHEST: Clear  to auscultation. No crackles or wheezes.  Diminished breath sounds bilaterally. CVS: S1 and S2 heard, no murmur. Regular rate and rhythm.  ABDOMEN: Soft, appropriately tender, midline incision with honeycomb, right lower abdominal drain, Bowel sounds are present. EXTREMITIES: No edema. CNS: Cranial nerves are intact. No focal motor deficits. SKIN: warm and dry without rashes.  Data Review: I have personally reviewed the following laboratory data and studies,  CBC: Recent Labs  Lab 12/07/23 1254 12/08/23 0700 12/09/23 0725  WBC 3.7* 11.3* 13.2*  NEUTROABS 1.7  --   --   HGB 13.2 10.6* 9.4*  HCT 41.7 33.0* 28.9*  MCV 82.4 81.3 81.4  PLT 215 152 162   Basic Metabolic Panel: Recent Labs  Lab 12/07/23 1254 12/07/23 2340 12/08/23 0700 12/09/23 0725  NA 141 141 138 136  K 3.4* 3.5 3.1* 2.7*  CL 104 109 107 107  CO2 23 20* 22 24  GLUCOSE 90 154* 172* 97  BUN 7* 7* 9 15  CREATININE 0.64 0.66 0.63 0.86  CALCIUM 9.1 7.8* 7.7* 7.4*  MG  --  1.5*  --  2.4   Liver Function Tests: Recent Labs  Lab 12/07/23 1254  AST 24  ALT 17  ALKPHOS 54  BILITOT 0.5  PROT 6.5  ALBUMIN 3.9   No results for input(s): "LIPASE", "AMYLASE" in the last 168 hours. No results for input(s): "AMMONIA" in the last 168 hours. Cardiac Enzymes: No results for input(s): "CKTOTAL", "CKMB", "CKMBINDEX", "TROPONINI" in the last 168 hours. BNP (last 3 results) No results for input(s): "BNP" in the last 8760 hours.  ProBNP (last 3 results) No results for input(s): "PROBNP" in the last 8760 hours.  CBG: No results for input(s): "GLUCAP" in the last 168 hours. No results found for this or any previous visit (from the past 240 hours).   Studies: CT ABDOMEN PELVIS W CONTRAST Result Date: 12/07/2023 CLINICAL DATA:  Peritonitis. EXAM: CT ABDOMEN AND PELVIS WITH CONTRAST TECHNIQUE: Multidetector CT imaging of the abdomen and pelvis was performed using the standard protocol following bolus administration of  intravenous contrast. RADIATION DOSE REDUCTION: This exam was performed according to the departmental dose-optimization program which includes automated exposure control, adjustment of the mA and/or kV according to patient size and/or use of iterative reconstruction technique. CONTRAST:  75mL OMNIPAQUE IOHEXOL 350 MG/ML SOLN COMPARISON:  CT 03/01/2020. Chest abdomen x-ray series earlier 12/07/2023 FINDINGS: Lower chest: Mild linear opacity lung bases likely scar or atelectasis. No pleural effusion. Small hiatal hernia. Hepatobiliary: Previous cholecystectomy. There is slight ectasia of the biliary tree but normal tapering of the common duct towards the pancreatic head. Slightly more pronounced than previous. Preserved hepatic parenchyma. Patent portal vein. Pancreas: Unremarkable. No pancreatic ductal dilatation or surrounding inflammatory changes. Spleen: Normal in size without focal abnormality. Adrenals/Urinary Tract: The adrenal glands are preserved. Bosniak 1 lower pole right-sided dominant renal cyst seen measuring 6.7 cm in diameter. Hounsfield unit of 10. Tiny other cystic lesions identified in each kidney. There is 1 lesion exophytic  from the midportion of the right kidney which appears complex. This is a small lesion measuring 9 mm on series 3, image 28. This was a tiny focus in 2021. This is more indeterminate lesion. Recommend either six-month follow-up study or MRI as clinically appropriate. Preserved contour to the urinary bladder. Stomach/Bowel: Surgical changes along the rectum. The large bowel is nondilated. Scattered colonic diverticula identified along the descending and sigmoid colon. There is significant wall thickening along the mid sigmoid colon with severe stranding. There are some metallic clips in this location. Please correlate with provided history of recent colonoscopy. There is significant adjacent free air extending superior to this portion of the colon. Additional scattered areas of  free air seen scattered throughout the mesentery and up by the level of the diaphragm as well consistent with perforation. The appendix has a normal caliber in the right lower quadrant. The stomach and small bowel are nondilated. Vascular/Lymphatic: No significant vascular findings are present. No enlarged abdominal or pelvic lymph nodes. Reproductive: Status post hysterectomy. No adnexal masses. Other: Small amount of free fluid in the pelvis. Again scattered free air, central mesenteric stranding. Musculoskeletal: Curvature of the lumbar spine with moderate degenerative changes. Pars defects at L5 with grade 2 anterolisthesis of L5 on S1. Multilevel disc bulging and stenosis along the spine. Critical Value/emergent results were called by telephone at the time of interpretation on 12/07/2023 at 4:54 pm to provider Dr. Wilkie Aye, who verbally acknowledged these results. IMPRESSION: Free air identified greatest surrounding the mid sigmoid colon where there is wall thickening, stranding and some clips. Please correlate for focal bowel perforation of the colon. No associated bowel obstruction. No extraluminal rim enhancing fluid collection suggested. Scattered stranding and trace simple free fluid in the pelvis. Previous cholecystectomy. Small hiatal hernia. Surgical changes along the rectum. Small slightly complex cystic lesion along the right mid kidney. Too small to completely characterize. Recommend simple follow-up CT in 6 months with without contrast. If there is more clinical concern at this time a follow up MRI could be considered when clinically appropriate. Electronically Signed   By: [name] Kays M.D.   On: 12/07/2023 16:56   DG Abdomen Acute W/Chest Result Date: 12/07/2023 CLINICAL DATA:  Bowel perforation. EXAM: DG ABDOMEN ACUTE WITH 1 VIEW CHEST COMPARISON:  Chest x-ray 09/02/2016 FINDINGS: Underinflation. No consolidation, pneumothorax or effusion. No edema. Normal cardiopericardial silhouette. Surgical  clips in the right axillary region. Overlapping cardiac leads. Degenerative changes of the spine and shoulders. Metallic foci along the pelvis, possible clips. There is also presumed cholecystectomy clips in the right upper quadrant. There is gaseous distention of loops of colon the mid abdomen. Few mildly dilated loops of small bowel left lower quadrant. There appears to be some free air beneath the diaphragm. Please see separate CT scan from same day. Moderate degenerative changes of the spine with curvature. Presumed vascular calcifications in the pelvis. IMPRESSION: Nonspecific bowel gas pattern with a few distended loops of air-filled small and large bowel. There does appear to be free air however beneath the diaphragm and recommend correlation with the separate CT scan from same day. Underinflation of the lungs.  No consolidation or effusion. Metallic clips in the central pelvis and right upper quadrant of the abdomen. Electronically Signed   By: [name] Kays M.D.   On: 12/07/2023 16:48      Joycelyn Das, MD  [facility] 12/09/2023  If 7PM-7AM, please contact night-coverage

## 2023-12-09 NOTE — Progress Notes (Addendum)
 Mobility Specialist: Progress Note   12/09/23 1311  Mobility  Activity Ambulated with assistance in hallway  Level of Assistance Contact guard assist, steadying assist  Assistive Device Other (Comment) (IV pole)  Distance Ambulated (ft) 200 ft  Activity Response Tolerated well  Mobility Referral Yes  Mobility visit 1 Mobility  Mobility Specialist Start Time (ACUTE ONLY) 1030  Mobility Specialist Stop Time (ACUTE ONLY) 1107  Mobility Specialist Time Calculation (min) (ACUTE ONLY) 37 min    Pt was agreeable to mobility session - received in bed. SV for bed mobility, CG for STS and ambulation. C/o abdominal pain from incision site. Returned to room without fault. Once pt was back in room, she was able to ambulate with SV to BR and back to bed. Left in bed with all needs met, call bell in reach.   Maurene Capes Mobility Specialist Please contact via SecureChat or Rehab office at [phone]

## 2023-12-09 NOTE — Progress Notes (Signed)
 2 Days Post-Op   Subjective/Chief Complaint: Better today, small amount flatus, was oob   Objective: Vital signs in last 24 hours: Temp:  [97.5 F (36.4 C)-98.2 F (36.8 C)] 97.5 F (36.4 C) (03/05 0444) Pulse Rate:  [52-79] 65 (03/05 0444) Resp:  [18-19] 18 (03/05 0444) BP: (86-112)/(46-75) 112/71 (03/05 0444) SpO2:  [90 %-97 %] 96 % (03/05 0444)    Intake/Output from previous day: 03/04 0701 - 03/05 0700 In: 509.5 [I.V.:359.5; IV Piggyback:150] Out: 510 [Urine:325; Drains:185] Intake/Output this shift: No intake/output data recorded.  Ab soft approp tender, drain more serous today, dressing clean  Lab Results:  Recent Labs    12/07/23 1254 12/08/23 0700  WBC 3.7* 11.3*  HGB 13.2 10.6*  HCT 41.7 33.0*  PLT 215 152   BMET Recent Labs    12/07/23 2340 12/08/23 0700  NA 141 138  K 3.5 3.1*  CL 109 107  CO2 20* 22  GLUCOSE 154* 172*  BUN 7* 9  CREATININE 0.66 0.63  CALCIUM 7.8* 7.7*   PT/INR No results for input(s): "LABPROT", "INR" in the last 72 hours. ABG No results for input(s): "PHART", "HCO3" in the last 72 hours.  Invalid input(s): "PCO2", "PO2"  Studies/Results: CT ABDOMEN PELVIS W CONTRAST Result Date: 12/07/2023 CLINICAL DATA:  Peritonitis. EXAM: CT ABDOMEN AND PELVIS WITH CONTRAST TECHNIQUE: Multidetector CT imaging of the abdomen and pelvis was performed using the standard protocol following bolus administration of intravenous contrast. RADIATION DOSE REDUCTION: This exam was performed according to the departmental dose-optimization program which includes automated exposure control, adjustment of the mA and/or kV according to patient size and/or use of iterative reconstruction technique. CONTRAST:  75mL OMNIPAQUE IOHEXOL 350 MG/ML SOLN COMPARISON:  CT 03/01/2020. Chest abdomen x-ray series earlier 12/07/2023 FINDINGS: Lower chest: Mild linear opacity lung bases likely scar or atelectasis. No pleural effusion. Small hiatal hernia. Hepatobiliary:  Previous cholecystectomy. There is slight ectasia of the biliary tree but normal tapering of the common duct towards the pancreatic head. Slightly more pronounced than previous. Preserved hepatic parenchyma. Patent portal vein. Pancreas: Unremarkable. No pancreatic ductal dilatation or surrounding inflammatory changes. Spleen: Normal in size without focal abnormality. Adrenals/Urinary Tract: The adrenal glands are preserved. Bosniak 1 lower pole right-sided dominant renal cyst seen measuring 6.7 cm in diameter. Hounsfield unit of 10. Tiny other cystic lesions identified in each kidney. There is 1 lesion exophytic from the midportion of the right kidney which appears complex. This is a small lesion measuring 9 mm on series 3, image 28. This was a tiny focus in 2021. This is more indeterminate lesion. Recommend either six-month follow-up study or MRI as clinically appropriate. Preserved contour to the urinary bladder. Stomach/Bowel: Surgical changes along the rectum. The large bowel is nondilated. Scattered colonic diverticula identified along the descending and sigmoid colon. There is significant wall thickening along the mid sigmoid colon with severe stranding. There are some metallic clips in this location. Please correlate with provided history of recent colonoscopy. There is significant adjacent free air extending superior to this portion of the colon. Additional scattered areas of free air seen scattered throughout the mesentery and up by the level of the diaphragm as well consistent with perforation. The appendix has a normal caliber in the right lower quadrant. The stomach and small bowel are nondilated. Vascular/Lymphatic: No significant vascular findings are present. No enlarged abdominal or pelvic lymph nodes. Reproductive: Status post hysterectomy. No adnexal masses. Other: Small amount of free fluid in the pelvis. Again scattered free air,  central mesenteric stranding. Musculoskeletal: Curvature of the  lumbar spine with moderate degenerative changes. Pars defects at L5 with grade 2 anterolisthesis of L5 on S1. Multilevel disc bulging and stenosis along the spine. Critical Value/emergent results were called by telephone at the time of interpretation on 12/07/2023 at 4:54 pm to provider Dr. Wilkie Aye, who verbally acknowledged these results. IMPRESSION: Free air identified greatest surrounding the mid sigmoid colon where there is wall thickening, stranding and some clips. Please correlate for focal bowel perforation of the colon. No associated bowel obstruction. No extraluminal rim enhancing fluid collection suggested. Scattered stranding and trace simple free fluid in the pelvis. Previous cholecystectomy. Small hiatal hernia. Surgical changes along the rectum. Small slightly complex cystic lesion along the right mid kidney. Too small to completely characterize. Recommend simple follow-up CT in 6 months with without contrast. If there is more clinical concern at this time a follow up MRI could be considered when clinically appropriate. Electronically Signed   By: [name] Kays M.D.   On: 12/07/2023 16:56   DG Abdomen Acute W/Chest Result Date: 12/07/2023 CLINICAL DATA:  Bowel perforation. EXAM: DG ABDOMEN ACUTE WITH 1 VIEW CHEST COMPARISON:  Chest x-ray 09/02/2016 FINDINGS: Underinflation. No consolidation, pneumothorax or effusion. No edema. Normal cardiopericardial silhouette. Surgical clips in the right axillary region. Overlapping cardiac leads. Degenerative changes of the spine and shoulders. Metallic foci along the pelvis, possible clips. There is also presumed cholecystectomy clips in the right upper quadrant. There is gaseous distention of loops of colon the mid abdomen. Few mildly dilated loops of small bowel left lower quadrant. There appears to be some free air beneath the diaphragm. Please see separate CT scan from same day. Moderate degenerative changes of the spine with curvature. Presumed vascular  calcifications in the pelvis. IMPRESSION: Nonspecific bowel gas pattern with a few distended loops of air-filled small and large bowel. There does appear to be free air however beneath the diaphragm and recommend correlation with the separate CT scan from same day. Underinflation of the lungs.  No consolidation or effusion. Metallic clips in the central pelvis and right upper quadrant of the abdomen. Electronically Signed   By: [name] Kays M.D.   On: 12/07/2023 16:48    Anti-infectives: Anti-infectives (From admission, onward)    Start     Dose/Rate Route Frequency Ordered Stop   12/08/23 0000  piperacillin-tazobactam (ZOSYN) IVPB 3.375 g  Status:  Discontinued        3.375 g 12.5 mL/hr over 240 Minutes Intravenous Every 8 hours 12/07/23 2157 12/07/23 2311   12/07/23 2330  piperacillin-tazobactam (ZOSYN) IVPB 3.375 g        3.375 g 12.5 mL/hr over 240 Minutes Intravenous Every 8 hours 12/07/23 2120     12/07/23 1345  piperacillin-tazobactam (ZOSYN) IVPB 3.375 g        3.375 g 100 mL/hr over 30 Minutes Intravenous  Once 12/07/23 1341 12/07/23 1541       Assessment/Plan: POD 2 colorraphy after colonoscopic perforation- SA -will do fulls today -oob, pulm toilet -continue drain will remove prior to dc -needs to remain until having bm/flatus -home meds -lovenox, scds -follow up labs today   Meghan Avila 12/09/2023

## 2023-12-10 DIAGNOSIS — K631 Perforation of intestine (nontraumatic): Secondary | ICD-10-CM | POA: Diagnosis not present

## 2023-12-10 LAB — BASIC METABOLIC PANEL
Anion gap: 6 (ref 5–15)
BUN: 13 mg/dL (ref 8–23)
CO2: 23 mmol/L (ref 22–32)
Calcium: 7.2 mg/dL — ABNORMAL LOW (ref 8.9–10.3)
Chloride: 110 mmol/L (ref 98–111)
Creatinine, Ser: 0.72 mg/dL (ref 0.44–1.00)
GFR, Estimated: >60 mL/min (ref >60)
Glucose, Bld: 103 mg/dL — ABNORMAL HIGH (ref 70–99)
Potassium: 3.3 mmol/L — ABNORMAL LOW (ref 3.5–5.1)
Sodium: 139 mmol/L (ref 135–145)

## 2023-12-10 LAB — CBC
HCT: 29.7 % — ABNORMAL LOW (ref 36.0–46.0)
Hemoglobin: 9.7 g/dL — ABNORMAL LOW (ref 12.0–15.0)
MCH: 26.4 pg (ref 26.0–34.0)
MCHC: 32.7 g/dL (ref 30.0–36.0)
MCV: 80.7 fL (ref 80.0–100.0)
Platelets: 172 10*3/uL (ref 150–400)
RBC: 3.68 MIL/uL — ABNORMAL LOW (ref 3.87–5.11)
RDW: 15.9 % — ABNORMAL HIGH (ref 11.5–15.5)
WBC: 9.9 10*3/uL (ref 4.0–10.5)
nRBC: 0 % (ref 0.0–0.2)

## 2023-12-10 LAB — MAGNESIUM: Magnesium: 2.3 mg/dL (ref 1.7–2.4)

## 2023-12-10 MED ORDER — POTASSIUM CHLORIDE CRYS ER 20 MEQ PO TBCR
40.0000 meq | EXTENDED_RELEASE_TABLET | Freq: Two times a day (BID) | ORAL | Status: AC
Start: 2023-12-10 — End: 2023-12-10
  Administered 2023-12-10 (×2): 40 meq via ORAL
  Filled 2023-12-10 (×2): qty 2

## 2023-12-10 MED ORDER — OXYCODONE HCL 5 MG PO TABS: 5.0000 mg | ORAL_TABLET | ORAL | 0 refills | Status: AC | PRN

## 2023-12-10 MED ORDER — METHOCARBAMOL 750 MG PO TABS: 750.0000 mg | ORAL_TABLET | Freq: Three times a day (TID) | ORAL | 0 refills | Status: DC | PRN

## 2023-12-10 MED ORDER — POTASSIUM CHLORIDE CRYS ER 20 MEQ PO TBCR: 40.0000 meq | EXTENDED_RELEASE_TABLET | Freq: Once | ORAL | Status: DC

## 2023-12-10 NOTE — Progress Notes (Signed)
 Physical Therapy Treatment Patient Details Name: Meghan Avila MRN: [id number] DOB: 1951/07/14 Today's Date: 12/10/2023   History of Present Illness 73 y.o. female admitted 3/3 after routine colonoscopy noted colon perf s/p ex lap same date. PMhx: HLD, OSA on CPAP, GERD, PVD, back sx, bil meniscus repairs    PT Comments  Pt with continued progress towards acute goals this session. Pt progressing gait tolerance with single UE support of IV pole with grossly CGA fading to supervision for safety with no overt LOB noted. Pt able to progress stair training with pt demonstrating good safety awareness with step-to pattern and single rail use to simulate home. Pt continues to benefit from skilled PT services to progress toward functional mobility goals. Educated pt on bracing painful abdomen with pillow when coughing for increased comfort with pt verbalizing understanding. Pt continues to benefit from skilled PT services to progress toward functional mobility goals.     If plan is discharge home, recommend the following: A little help with walking and/or transfers;A little help with bathing/dressing/bathroom;Assistance with cooking/housework   Can travel by private vehicle        Equipment Recommendations  None recommended by PT    Recommendations for Other Services       Precautions / Restrictions Precautions Precautions: Fall;Other (comment) Recall of Precautions/Restrictions: Intact Restrictions Weight Bearing Restrictions Per Provider Order: No     Mobility  Bed Mobility Overal bed mobility: Needs Assistance             General bed mobility comments: seated up EOB on arrival    Transfers Overall transfer level: Needs assistance Equipment used: None Transfers: Sit to/from Stand Sit to Stand: Contact guard assist           General transfer comment: CGA for safety    Ambulation/Gait Ambulation/Gait assistance: Contact guard assist, Supervision Gait Distance  (Feet): 175 Feet Assistive device: IV Pole Gait Pattern/deviations: Step-through pattern, Decreased stride length       General Gait Details: steady gait with reliance on single UE support, progressing from CGA to supervision   Stairs Stairs: Yes Stairs assistance: Contact guard assist Stair Management: One rail Right, Step to pattern, Forwards Number of Stairs: 3 General stair comments: up/down without fault   Wheelchair Mobility     Tilt Bed    Modified Rankin (Stroke Patients Only)       Balance Overall balance assessment: Needs assistance   Sitting balance-Leahy Scale: Normal     Standing balance support: Single extremity supported Standing balance-Leahy Scale: Fair                              Hotel manager: No apparent difficulties  Cognition Arousal: Alert Behavior During Therapy: WFL for tasks assessed/performed   PT - Cognitive impairments: No apparent impairments                         Following commands: Intact      Cueing Cueing Techniques: Verbal cues  Exercises      General Comments General comments (skin integrity, edema, etc.): pt with c/o abdominal pain with coughing, educated pt on use of pillow to brace at abdomen for comfort      Pertinent Vitals/Pain Pain Assessment Pain Assessment: Faces Faces Pain Scale: Hurts a little bit Pain Location: abdomen Pain Descriptors / Indicators: Sore Pain Intervention(s): Monitored during session, Limited activity within patient's tolerance  Home Living                          Prior Function            PT Goals (current goals can now be found in the care plan section) Acute Rehab PT Goals Patient Stated Goal: return home PT Goal Formulation: With patient Time For Goal Achievement: 12/22/23 Progress towards PT goals: Progressing toward goals    Frequency    Min 2X/week      PT Plan      Co-evaluation               AM-PAC PT "6 Clicks" Mobility   Outcome Measure  Help needed turning from your back to your side while in a flat bed without using bedrails?: A Little Help needed moving from lying on your back to sitting on the side of a flat bed without using bedrails?: A Little Help needed moving to and from a bed to a chair (including a wheelchair)?: A Little Help needed standing up from a chair using your arms (e.g., wheelchair or bedside chair)?: A Little Help needed to walk in hospital room?: A Little Help needed climbing 3-5 steps with a railing? : A Little 6 Click Score: 18    End of Session   Activity Tolerance: Patient tolerated treatment well Patient left: with call bell/phone within reach;in bed (seated up EOB) Nurse Communication: Mobility status PT Visit Diagnosis: Other abnormalities of gait and mobility (R26.89);Difficulty in walking, not elsewhere classified (R26.2)     Time: 9604-5409 PT Time Calculation (min) (ACUTE ONLY): 16 min  Charges:    $Gait Training: 8-22 mins PT General Charges $$ ACUTE PT VISIT: 1 Visit                     Elinora Weigand R. PTA Acute Rehabilitation Services Office: [phone]   Catalina Antigua 12/10/2023, 4:25 PM

## 2023-12-10 NOTE — Progress Notes (Signed)
 [facility] Progress Note  SUBJECTIVE:   Interval history: Meghan Avila was seen and evaluated today at bedside.  Resting comfortably, seated in bed.  Denied nausea or vomiting.  Awaiting breakfast.  Had diarrhea overnight, dark in color, nonbloody.  Abdominal pain appears controlled.  General surgery notes reviewed, if tolerates lunch likely discharge today.  Past Medical History:  Diagnosis Date   Arthritis    Family history of adverse reaction to anesthesia    post nausea and vomitting   GERD (gastroesophageal reflux disease)    hx of   H/O wheezing    occ.   Hand swelling    related to lymph nodes    Headache    migraines   History of hiatal hernia    Leg swelling    left leg -edema knee level to ankle.   Osteoporosis    Peripheral vascular disease (HCC)    left leg    PONV (postoperative nausea and vomiting)    Scoliosis    Vertigo    Past Surgical History:  Procedure Laterality Date   ANAL RECTAL MANOMETRY N/A 08/03/2019   Procedure: ANO RECTAL MANOMETRY;  Surgeon: Romie Levee, MD;  Location: [facility];  Service: Endoscopy;  Laterality: N/A;   BACK SURGERY     scoliosis surgery 1970 and 1972   BREAST REDUCTION SURGERY  1997   CHOLECYSTECTOMY  1992   laparoscopic   COLONOSCOPY WITH PROPOFOL N/A 04/19/2015   Procedure: COLONOSCOPY WITH PROPOFOL;  Surgeon: Charolett Bumpers, MD;  Location: [facility];  Service: Endoscopy;  Laterality: N/A;   DEEP AXILLARY SENTINEL NODE BIOPSY / EXCISION     x2   ESOPHAGEAL MANOMETRY N/A 05/04/2015   Procedure: ESOPHAGEAL MANOMETRY (EM);  Surgeon: Charolett Bumpers, MD;  Location: [facility];  Service: Endoscopy;  Laterality: N/A;   ESOPHAGOGASTRODUODENOSCOPY (EGD) WITH PROPOFOL N/A 04/19/2015   Procedure: ESOPHAGOGASTRODUODENOSCOPY (EGD) WITH PROPOFOL;  Surgeon: Charolett Bumpers, MD;  Location: [facility];  Service: Endoscopy;  Laterality: N/A;   HEMORROIDECTOMY  2002, 2005   hiatal hernia surgery       JOINT REPLACEMENT     total right knee Dr. Thomasena Edis 02-26-18   LAPAROTOMY N/A 12/07/2023   Procedure: EXPLORATORY LAPAROTOMY;  Surgeon: Fritzi Mandes, MD;  Location: [facility];  Service: General;  Laterality: N/A;   LASER ABLATION  2008   left leg    left knee surgery - torn meniscus (left)     LIPOMA EXCISION N/A 09/21/2015   Procedure: EXCISION 6CM BACK LIPOMA;  Surgeon: Axel Filler, MD;  Location: [facility];  Service: General;  Laterality: N/A;   MENISCUS REPAIR     right   REDUCTION MAMMAPLASTY Bilateral 1998   right shoulder rotator cuff surgery      ROBOTIC ASSISTED LAPAROSCOPIC SACROCOLPOPEXY N/A 05/30/2021   Procedure: XI ROBOTIC ASSISTED LAPAROSCOPIC SACROCOLPOPEXY;  Surgeon: Crist Fat, MD;  Location: [facility];  Service: Urology;  Laterality: N/A;   scoliosis  1970, 1972   no retained hardware   TOTAL KNEE ARTHROPLASTY Right 02/26/2018   Procedure: RIGHT TOTAL KNEE ARTHROPLASTY;  Surgeon: Eugenia Mcalpine, MD;  Location: [facility];  Service: Orthopedics;  Laterality: Right;   Current Facility-Administered Medications  Medication Dose Route Frequency Provider Last Rate Last Admin   acetaminophen (TYLENOL) tablet 1,000 mg  1,000 mg Oral TID Fritzi Mandes, MD   1,000 mg at 12/10/23 0917   amitriptyline (ELAVIL) tablet 10 mg  10 mg Oral QHS Emelia Loron, MD  10 mg at 12/09/23 2221   atorvastatin (LIPITOR) tablet 10 mg  10 mg Oral q AM Pokhrel, Laxman, MD   10 mg at 12/10/23 0610   diphenhydrAMINE (BENADRYL) 12.5 MG/5ML elixir 12.5 mg  12.5 mg Oral Q6H PRN Fritzi Mandes, MD       Or   diphenhydrAMINE (BENADRYL) injection 12.5 mg  12.5 mg Intravenous Q6H PRN Fritzi Mandes, MD       enoxaparin (LOVENOX) injection 40 mg  40 mg Subcutaneous Q24H Sophronia Simas L, MD   40 mg at 12/10/23 8119   HYDROmorphone (DILAUDID) injection 0.5 mg  0.5 mg Intravenous Q3H PRN Sophronia Simas L, MD   0.5 mg at 12/08/23 0042   methocarbamol (ROBAXIN) injection 500 mg  500 mg Intravenous Veryl Speak, MD   500 mg at 12/10/23 0610   metoprolol tartrate (LOPRESSOR) tablet 25 mg  25 mg Oral BID Emelia Loron, MD   25 mg at 12/10/23 0916   ondansetron (ZOFRAN) tablet 4 mg  4 mg Oral Q6H PRN Fritzi Mandes, MD   4 mg at 12/09/23 1024   Or   ondansetron (ZOFRAN) injection 4 mg  4 mg Intravenous Q6H PRN Fritzi Mandes, MD   4 mg at 12/08/23 1448   oxyCODONE (Oxy IR/ROXICODONE) immediate release tablet 5-10 mg  5-10 mg Oral Q4H PRN Fritzi Mandes, MD   5 mg at 12/09/23 1024   piperacillin-tazobactam (ZOSYN) IVPB 3.375 g  3.375 g Intravenous Q8H Doutova, Anastassia, MD 12.5 mL/hr at 12/10/23 0924 3.375 g at 12/10/23 0924   potassium chloride SA (KLOR-CON M) CR tablet 40 mEq  40 mEq Oral BID Emelia Loron, MD   40 mEq at 12/10/23 1478   Allergies as of 12/07/2023 - Review Complete 12/07/2023  Allergen Reaction Noted   Zantac [ranitidine hcl] Anaphylaxis 02/05/2015   Alendronate  11/04/2023   Morphine and codeine Nausea And Vomiting 02/05/2015   Prednisone Diarrhea and Nausea And Vomiting 08/11/2011   Ranitidine Swelling 11/04/2023   Tramadol Nausea And Vomiting 02/05/2015   Review of Systems:  Review of Systems  Respiratory:  Negative for shortness of breath.   Cardiovascular:  Negative for chest pain.  Gastrointestinal:  Positive for diarrhea. Negative for blood in stool, nausea and vomiting.    OBJECTIVE:   Temp:  [98 F (36.7 C)-98.6 F (37 C)] 98 F (36.7 C) (03/06 0736) Pulse Rate:  [64-76] 73 (03/06 0736) Resp:  [17-18] 18 (03/06 0736) BP: (99-128)/(53-81) 128/81 (03/06 0736) SpO2:  [94 %-97 %] 97 % (03/06 0736) Weight:  [71.3 kg] 71.3 kg (03/06 0507) Last BM Date : 12/09/23 Physical Exam Constitutional:      General: She is not in acute distress.    Appearance: She is not ill-appearing, toxic-appearing or diaphoretic.  Cardiovascular:     Rate and Rhythm: Rhythm irregular.  Pulmonary:     Effort: No respiratory distress.     Breath sounds:  Normal breath sounds.  Abdominal:     General: Bowel sounds are normal. There is no distension.     Palpations: Abdomen is soft.     Tenderness: There is abdominal tenderness.  Neurological:     Mental Status: She is alert.     Labs: Recent Labs    12/08/23 0700 12/09/23 0725 12/10/23 0724  WBC 11.3* 13.2* 9.9  HGB 10.6* 9.4* 9.7*  HCT 33.0* 28.9* 29.7*  PLT 152 162 172   BMET Recent Labs    12/08/23 0700 12/09/23  0725 12/10/23 0724  NA 138 136 139  K 3.1* 2.7* 3.3*  CL 107 107 110  CO2 22 24 23   GLUCOSE 172* 97 103*  BUN 9 15 13   CREATININE 0.63 0.86 0.72  CALCIUM 7.7* 7.4* 7.2*   LFT Recent Labs    12/07/23 1254  PROT 6.5  ALBUMIN 3.9  AST 24  ALT 17  ALKPHOS 54  BILITOT 0.5   PT/INR No results for input(s): "LABPROT", "INR" in the last 72 hours. Diagnostic imaging: No results found.  IMPRESSION: Colonic perforation in rectosigmoid colon -Outpatient diagnostic colonoscopy 12/07/2023 for personal history of colon polyps -POD#3 s/p colorraphy  Abdominal pain secondary to above History of left-sided diverticular disease History of internal and external hemorrhoids  PLAN: -General Surgery management -[facility] will follow, recommend outpatient follow-up with Dr. Marca Ancona   LOS: 3 days   Liliane Shi, DO [facility]

## 2023-12-10 NOTE — Progress Notes (Signed)
 PROGRESS NOTE  Meghan Avila JWJ:[id number] DOB: September 03, 1951 DOA: 12/07/2023 PCP: Sigmund Hazel, MD   LOS: 3 days   Brief narrative:  Patient is a 73 years old female with past medical history of hyperlipidemia, obstructive sleep apnea on CPAP, GERD, history of colon polyps, peripheral vascular disease presented to the hospital abdominal pain after undergoing routine colonoscopy for surveillance for previous polyps..  During the procedure,  she was noted to have colonic perforation and closure  was attempted with 6 metallic clips GI and was sent to the ED for further care.  In the ED, patient was mildly tachycardic.  Initial labs showed hypokalemia with potassium of 3.4.  CT scan of the abdomen showed thickening of the rectosigmoid colon with rounding free air.  General surgery was emergently consulted and underwent emergent exploratory laparotomy.   Assessment/Plan: Principal Problem:   Colon perforation (HCC) Active Problems:   OSA (obstructive sleep apnea)   Hypokalemia   Renal cyst  Colonic perforation.  Status post exploratory laparotomy with colorrhaphy general surgery on 12/07/2023.  General surgery on board.  Continue Zosyn.  Currently on clears has been advanced to soft diet today..  Continue supportive care including hydration, analgesics antiemetics and supportive care.  Continue incentive spirometry.  Encourage out of bed.  PT to ambulate.  Follow surgical recommendations.  Patient is afebrile without any leukocytosis.  Hyperlipidemia.  Continue Lipitor  History of peripheral vascular disease.  On Lipitor.   Obstructive sleep apnea on nocturnal CPAP.  Hypokalemia.  Improvement noted today.  Potassium still at 3.4 today, will replenish orally now and in the evening..  Check levels in AM.  DVT prophylaxis: enoxaparin (LOVENOX) injection 40 mg Start: 12/08/23 0800 SCD's Start: 12/07/23 2158 Place TED hose Start: 12/07/23 2158   Disposition: Home likely on  12/11/2023.  Status is: Inpatient  Remains inpatient appropriate because: Status post surgery, pending clinical improvement, PT evaluation.    Code Status:     Code Status: Full Code  Family Communication: I spoke with the patient's sister karen and updated her about the clinical condition of the patient.   Consultants: GI   general surgery  Procedures: Status post exploratory laparotomy with colorrhaphy on 12/07/2023  Anti-infectives:  Zosyn IV  Anti-infectives (From admission, onward)    Start     Dose/Rate Route Frequency Ordered Stop   12/08/23 0000  piperacillin-tazobactam (ZOSYN) IVPB 3.375 g  Status:  Discontinued        3.375 g 12.5 mL/hr over 240 Minutes Intravenous Every 8 hours 12/07/23 2157 12/07/23 2311   12/07/23 2330  piperacillin-tazobactam (ZOSYN) IVPB 3.375 g        3.375 g 12.5 mL/hr over 240 Minutes Intravenous Every 8 hours 12/07/23 2120     12/07/23 1345  piperacillin-tazobactam (ZOSYN) IVPB 3.375 g        3.375 g 100 mL/hr over 30 Minutes Intravenous  Once 12/07/23 1341 12/07/23 1541      Subjective:  Today, patient was seen and examined at bedside.  Stated that she feels hungry and was able to eat some.  Denies any nausea or vomiting.  Had some loose stool overnight.  No shortness of breath dyspnea or chest pain.    Objective: Vitals:   12/10/23 0507 12/10/23 0736  BP: 109/66 128/81  Pulse: 76 73  Resp: 17 18  Temp: 98.6 F (37 C) 98 F (36.7 C)  SpO2: 95% 97%    Intake/Output Summary (Last 24 hours) at 12/10/2023 1138 Last data filed at  12/10/2023 1610 Gross per 24 hour  Intake 649.84 ml  Output 72 ml  Net 577.84 ml   Filed Weights   12/08/23 0500 12/09/23 0759 12/10/23 0507  Weight: 67.3 kg 68.1 kg 71.3 kg   Body mass index is 30.7 kg/m.   Physical Exam:  GENERAL: Patient is alert awake and oriented. Not in obvious distress.  On room air, obese HENT: No scleral pallor or icterus. Pupils equally reactive to light. Oral mucosa is  moist NECK: is supple, no gross swelling noted. CHEST: Clear to auscultation. No crackles or wheezes.   CVS: S1 and S2 heard, no murmur. Regular rate and rhythm.  ABDOMEN: Soft, appropriately tender, midline incision with honeycomb, right lower abdominal drain, Bowel sounds are present. EXTREMITIES: No edema. CNS: Cranial nerves are intact. No focal motor deficits. SKIN: warm and dry   Data Review: I have personally reviewed the following laboratory data and studies,  CBC: Recent Labs  Lab 12/07/23 1254 12/08/23 0700 12/09/23 0725 12/10/23 0724  WBC 3.7* 11.3* 13.2* 9.9  NEUTROABS 1.7  --   --   --   HGB 13.2 10.6* 9.4* 9.7*  HCT 41.7 33.0* 28.9* 29.7*  MCV 82.4 81.3 81.4 80.7  PLT 215 152 162 172   Basic Metabolic Panel: Recent Labs  Lab 12/07/23 1254 12/07/23 2340 12/08/23 0700 12/09/23 0725 12/10/23 0724  NA 141 141 138 136 139  K 3.4* 3.5 3.1* 2.7* 3.3*  CL 104 109 107 107 110  CO2 23 20* 22 24 23   GLUCOSE 90 154* 172* 97 103*  BUN 7* 7* 9 15 13   CREATININE 0.64 0.66 0.63 0.86 0.72  CALCIUM 9.1 7.8* 7.7* 7.4* 7.2*  MG  --  1.5*  --  2.4 2.3   Liver Function Tests: Recent Labs  Lab 12/07/23 1254  AST 24  ALT 17  ALKPHOS 54  BILITOT 0.5  PROT 6.5  ALBUMIN 3.9   No results for input(s): "LIPASE", "AMYLASE" in the last 168 hours. No results for input(s): "AMMONIA" in the last 168 hours. Cardiac Enzymes: No results for input(s): "CKTOTAL", "CKMB", "CKMBINDEX", "TROPONINI" in the last 168 hours. BNP (last 3 results) No results for input(s): "BNP" in the last 8760 hours.  ProBNP (last 3 results) No results for input(s): "PROBNP" in the last 8760 hours.  CBG: No results for input(s): "GLUCAP" in the last 168 hours. No results found for this or any previous visit (from the past 240 hours).   Studies: No results found.     Joycelyn Das, MD  [facility] 12/10/2023  If 7PM-7AM, please contact night-coverage

## 2023-12-10 NOTE — Progress Notes (Addendum)
 3 Days Post-Op   Subjective/Chief Complaint: Loose stools, tol liquids   Objective: Vital signs in last 24 hours: Temp:  [98 F (36.7 C)-98.6 F (37 C)] 98 F (36.7 C) (03/06 0736) Pulse Rate:  [64-76] 73 (03/06 0736) Resp:  [17-18] 18 (03/06 0736) BP: (99-128)/(53-81) 128/81 (03/06 0736) SpO2:  [94 %-97 %] 97 % (03/06 0736) Weight:  [71.3 kg] 71.3 kg (03/06 0507) Last BM Date : 12/09/23  Intake/Output from previous day: 03/05 0701 - 03/06 0700 In: 649.8 [I.V.:499.8; IV Piggyback:150] Out: 72 [Drains:72] Intake/Output this shift: No intake/output data recorded.  Ab soft approp tender drain serosang dressing dry  Lab Results:  Recent Labs    12/09/23 0725 12/10/23 0724  WBC 13.2* 9.9  HGB 9.4* 9.7*  HCT 28.9* 29.7*  PLT 162 172   BMET Recent Labs    12/09/23 0725 12/10/23 0724  NA 136 139  K 2.7* 3.3*  CL 107 110  CO2 24 23  GLUCOSE 97 103*  BUN 15 13  CREATININE 0.86 0.72  CALCIUM 7.4* 7.2*   PT/INR No results for input(s): "LABPROT", "INR" in the last 72 hours. ABG No results for input(s): "PHART", "HCO3" in the last 72 hours.  Invalid input(s): "PCO2", "PO2"  Studies/Results: No results found.  Anti-infectives: Anti-infectives (From admission, onward)    Start     Dose/Rate Route Frequency Ordered Stop   12/08/23 0000  piperacillin-tazobactam (ZOSYN) IVPB 3.375 g  Status:  Discontinued        3.375 g 12.5 mL/hr over 240 Minutes Intravenous Every 8 hours 12/07/23 2157 12/07/23 2311   12/07/23 2330  piperacillin-tazobactam (ZOSYN) IVPB 3.375 g        3.375 g 12.5 mL/hr over 240 Minutes Intravenous Every 8 hours 12/07/23 2120     12/07/23 1345  piperacillin-tazobactam (ZOSYN) IVPB 3.375 g        3.375 g 100 mL/hr over 30 Minutes Intravenous  Once 12/07/23 1341 12/07/23 1541       Assessment/Plan: POD 3 colorraphy after colonoscopic perforation- SA -soft food -oob, pulm toilet -continue drain will remove prior to dc -home  meds -lovenox, scds -replace K -can dc home later today if she tolerates lunch -dc jp drain   Emelia Loron 12/10/2023

## 2023-12-10 NOTE — Discharge Instructions (Signed)
 [facility]      [facility], [place] [phone]  OPEN ABDOMINAL SURGERY: POST OP INSTRUCTIONS  Always review your discharge instruction sheet given to you by the facility where your surgery was performed.  IF YOU HAVE DISABILITY OR FAMILY LEAVE FORMS, YOU MUST BRING THEM TO THE OFFICE FOR PROCESSING.  PLEASE DO NOT GIVE THEM TO YOUR DOCTOR.  A prescription for pain medication may be given to you upon discharge.  Take your pain medication as prescribed, if needed.  If narcotic pain medicine is not needed, then you may take acetaminophen (Tylenol) or ibuprofen (Advil) as needed. Take your usually prescribed medications unless otherwise directed. If you need a refill on your pain medication, please contact your pharmacy. They will contact our office to request authorization.  Prescriptions will not be filled after 5pm or on week-ends. You should follow a light diet the first few days after arrival home, such as soup and crackers, pudding, etc.unless your doctor has advised otherwise. A high-fiber, low fat diet can be resumed as tolerated.   Be sure to include lots of fluids daily. Most patients will experience some swelling and bruising on the chest and neck area.  Ice packs will help.  Swelling and bruising can take several days to resolve Most patients will experience some swelling and bruising in the area of the incision. Ice pack will help. Swelling and bruising can take several days to resolve..  It is common to experience some constipation if taking pain medication after surgery.  Increasing fluid intake and taking a stool softener will usually help or prevent this problem from occurring.  A mild laxative (Milk of Magnesia or Miralax) should be taken according to package directions if there are no bowel movements after 48 hours.  You may have steri-strips (small skin tapes) in place directly over the incision.  These strips should be left on the skin for 7-10 days.  If your surgeon used skin  glue on the incision, you may shower in 24 hours.  The glue will flake off over the next 2-3 weeks.  Any sutures or staples will be removed at the office during your follow-up visit. You may find that a light gauze bandage over your incision may keep your staples from being rubbed or pulled. You may shower and replace the bandage daily. ACTIVITIES:  You may resume regular (light) daily activities beginning the next day--such as daily self-care, walking, climbing stairs--gradually increasing activities as tolerated.  You may have sexual intercourse when it is comfortable.  Refrain from any heavy lifting or straining until approved by your doctor. You may drive when you no longer are taking prescription pain medication, you can comfortably wear a seatbelt, and you can safely maneuver your car and apply brakes Return to Work: ___________________________________ Bonita Quin should see your doctor in the office for a follow-up appointment approximately two weeks after your surgery.  Make sure that you call for this appointment within a day or two after you arrive home to insure a convenient appointment time. OTHER INSTRUCTIONS:  _____________________________________________________________ _____________________________________________________________  WHEN TO CALL YOUR DOCTOR: Fever over 101.0 Inability to urinate Nausea and/or vomiting Extreme swelling or bruising Continued bleeding from incision. Increased pain, redness, or drainage from the incision.    The clinic staff is available to answer your questions during regular business hours.  Please don't hesitate to call and ask to speak to one of the nurses if you have concerns.  For further questions, please visit [email or web address]

## 2023-12-10 NOTE — Plan of Care (Signed)
  Problem: Education: Goal: Knowledge of General Education information will improve Description: Including pain rating scale, medication(s)/side effects and non-pharmacologic comfort measures Outcome: Progressing   Problem: Clinical Measurements: Goal: Will remain free from infection Outcome: Progressing   Problem: Nutrition: Goal: Adequate nutrition will be maintained Outcome: Progressing   Problem: Activity: Goal: Risk for activity intolerance will decrease Outcome: Progressing

## 2023-12-10 NOTE — Progress Notes (Signed)
 Mobility Specialist: Progress Note   12/10/23 1216  Mobility  Activity Ambulated with assistance in hallway  Level of Assistance Standby assist, set-up cues, supervision of patient - no hands on  Assistive Device Other (Comment) (IV pole)  Distance Ambulated (ft) 200 ft  Activity Response Tolerated well  Mobility Referral Yes  Mobility visit 1 Mobility  Mobility Specialist Start Time (ACUTE ONLY) 1105  Mobility Specialist Stop Time (ACUTE ONLY) 1118  Mobility Specialist Time Calculation (min) (ACUTE ONLY) 13 min    Pt was agreeable to mobility session - received in bed. SV throughout. C/o abdominal pain from incision site that is agitated but some coughing she's been having today. Returned to room without fault. Left in bed with all needs met, call bell in reach.   Maurene Capes Mobility Specialist Please contact via SecureChat or Rehab office at [phone]

## 2023-12-10 NOTE — Progress Notes (Signed)
 JP drain removed per order. Dressing CDI. Patient without complaints.

## 2023-12-10 NOTE — Plan of Care (Signed)

## 2023-12-11 DIAGNOSIS — K631 Perforation of intestine (nontraumatic): Secondary | ICD-10-CM | POA: Diagnosis not present

## 2023-12-11 LAB — CBC
HCT: 29.8 % — ABNORMAL LOW (ref 36.0–46.0)
Hemoglobin: 9.8 g/dL — ABNORMAL LOW (ref 12.0–15.0)
MCH: 26.6 pg (ref 26.0–34.0)
MCHC: 32.9 g/dL (ref 30.0–36.0)
MCV: 81 fL (ref 80.0–100.0)
Platelets: 204 10*3/uL (ref 150–400)
RBC: 3.68 MIL/uL — ABNORMAL LOW (ref 3.87–5.11)
RDW: 16.1 % — ABNORMAL HIGH (ref 11.5–15.5)
WBC: 6.4 10*3/uL (ref 4.0–10.5)
nRBC: 0 % (ref 0.0–0.2)

## 2023-12-11 LAB — BASIC METABOLIC PANEL
Anion gap: 3 — ABNORMAL LOW (ref 5–15)
BUN: 11 mg/dL (ref 8–23)
CO2: 22 mmol/L (ref 22–32)
Calcium: 7.2 mg/dL — ABNORMAL LOW (ref 8.9–10.3)
Chloride: 112 mmol/L — ABNORMAL HIGH (ref 98–111)
Creatinine, Ser: 0.53 mg/dL (ref 0.44–1.00)
GFR, Estimated: >60 mL/min (ref >60)
Glucose, Bld: 88 mg/dL (ref 70–99)
Potassium: 3.8 mmol/L (ref 3.5–5.1)
Sodium: 137 mmol/L (ref 135–145)

## 2023-12-11 LAB — MAGNESIUM: Magnesium: 2 mg/dL (ref 1.7–2.4)

## 2023-12-11 NOTE — Discharge Summary (Signed)
 Physician Discharge Summary  Aylyn Wenzler JXB:[id number] DOB: 10-24-1950 DOA: 12/07/2023  PCP: Sigmund Hazel, MD  Admit date: 12/07/2023 Discharge date: 12/11/2023  Admitted From: Home  Discharge disposition: Home   Recommendations for Outpatient Follow-Up:   Follow up with your primary care provider in one week.  Check CBC, BMP, magnesium in the next visit Follow-up with general surgery as scheduled by the clinic. Follow-up with GI Dr. Marca Ancona in 3 to 4 weeks after discharge.   Discharge Diagnosis:   Principal Problem:   Colon perforation (HCC) Active Problems:   OSA (obstructive sleep apnea)   Hypokalemia   Renal cyst   Discharge Condition: Improved.  Diet recommendation: Low fiber diet.  Wound care: None.  Code status: Full.   History of Present Illness:    Patient is a 73 years old female with past medical history of hyperlipidemia, obstructive sleep apnea on CPAP, GERD, history of colon polyps, peripheral vascular disease presented to the hospital abdominal pain after undergoing routine colonoscopy for surveillance for previous polyps..  During the procedure,  she was noted to have colonic perforation and closure  was attempted with 6 metallic clips GI and was sent to the ED for further care.  In the ED, patient was mildly tachycardic.  Initial labs showed hypokalemia with potassium of 3.4.  CT scan of the abdomen showed thickening of the rectosigmoid colon with rounding free air.  General surgery was emergently consulted and underwent emergent exploratory laparotomy.   Hospital Course:   Following conditions were addressed during hospitalization as listed below,  Colonic perforation.  Status post exploratory laparotomy with colorrhaphy general surgery on 12/07/2023.  General surgery followed up during hospitalization and at this time patient has been advanced to soft diet which she has tolerated.  GI also followed the patient during hospitalization.  General  surgery has recommended outpatient follow-up.  Patient will also follow-up with GI as outpatient in 3 to 4 weeks.  Was empirically on antibiotic during hospitalization which will be discontinued.  Continue symptomatic care including pain medication on discharge.   Hyperlipidemia.  Continue Lipitor   History of peripheral vascular disease.  On Lipitor.    Obstructive sleep apnea on nocturnal CPAP.   Hypokalemia.  Resolved.  Potassium prior to discharge was 3.8.  Disposition.  At this time, patient is stable for disposition home with outpatient PCP, GI and general surgery follow-up.  Seen by physical therapy during hospitalization with no special therapy needs.  Medical Consultants:   GI General surgery  Procedures:    exploratory laparotomy with colorrhaphy general surgery on 12/07/2023. Subjective:   Today, patient was seen and examined at bedside.  Feels okay.  Denies any nausea vomiting fever chills or rigor.  Asking about low fiber diet.  Wants to go home.  Tolerating oral diet.  Having bowel function.  Discharge Exam:   Vitals:   12/11/23 0420 12/11/23 0812  BP: 124/65 (!) 148/92  Pulse: (!) 44 85  Resp: 16 18  Temp: 97.6 F (36.4 C) 98 F (36.7 C)  SpO2: 97% 100%   Vitals:   12/10/23 2012 12/11/23 0420 12/11/23 0500 12/11/23 0812  BP: 107/70 124/65  (!) 148/92  Pulse: 66 (!) 44  85  Resp: 18 16  18   Temp:  97.6 F (36.4 C)  98 F (36.7 C)  TempSrc:  Oral    SpO2: 98% 97%  100%  Weight:   70.4 kg   Height:        General:  Alert awake, not in obvious distress HENT: pupils equally reacting to light,  No scleral pallor or icterus noted. Oral mucosa is moist.  Chest:  Clear breath sounds.  Diminished breath sounds bilaterally. No crackles or wheezes.  CVS: S1 &S2 heard. No murmur.  Regular rate and rhythm. Abdomen: Soft, appropriately tender, midline incision with honeycomb..  Bowel sounds are heard.   Extremities: No cyanosis, clubbing or edema.  Peripheral  pulses are palpable. Psych: Alert, awake and oriented, normal mood CNS:  No cranial nerve deficits.  Power equal in all extremities.   Skin: Warm and dry.  No rashes noted.  The results of significant diagnostics from this hospitalization (including imaging, microbiology, ancillary and laboratory) are listed below for reference.     Diagnostic Studies:   CT ABDOMEN PELVIS W CONTRAST Result Date: 12/07/2023 CLINICAL DATA:  Peritonitis. EXAM: CT ABDOMEN AND PELVIS WITH CONTRAST TECHNIQUE: Multidetector CT imaging of the abdomen and pelvis was performed using the standard protocol following bolus administration of intravenous contrast. RADIATION DOSE REDUCTION: This exam was performed according to the departmental dose-optimization program which includes automated exposure control, adjustment of the mA and/or kV according to patient size and/or use of iterative reconstruction technique. CONTRAST:  75mL OMNIPAQUE IOHEXOL 350 MG/ML SOLN COMPARISON:  CT 03/01/2020. Chest abdomen x-ray series earlier 12/07/2023 FINDINGS: Lower chest: Mild linear opacity lung bases likely scar or atelectasis. No pleural effusion. Small hiatal hernia. Hepatobiliary: Previous cholecystectomy. There is slight ectasia of the biliary tree but normal tapering of the common duct towards the pancreatic head. Slightly more pronounced than previous. Preserved hepatic parenchyma. Patent portal vein. Pancreas: Unremarkable. No pancreatic ductal dilatation or surrounding inflammatory changes. Spleen: Normal in size without focal abnormality. Adrenals/Urinary Tract: The adrenal glands are preserved. Bosniak 1 lower pole right-sided dominant renal cyst seen measuring 6.7 cm in diameter. Hounsfield unit of 10. Tiny other cystic lesions identified in each kidney. There is 1 lesion exophytic from the midportion of the right kidney which appears complex. This is a small lesion measuring 9 mm on series 3, image 28. This was a tiny focus in 2021. This  is more indeterminate lesion. Recommend either six-month follow-up study or MRI as clinically appropriate. Preserved contour to the urinary bladder. Stomach/Bowel: Surgical changes along the rectum. The large bowel is nondilated. Scattered colonic diverticula identified along the descending and sigmoid colon. There is significant wall thickening along the mid sigmoid colon with severe stranding. There are some metallic clips in this location. Please correlate with provided history of recent colonoscopy. There is significant adjacent free air extending superior to this portion of the colon. Additional scattered areas of free air seen scattered throughout the mesentery and up by the level of the diaphragm as well consistent with perforation. The appendix has a normal caliber in the right lower quadrant. The stomach and small bowel are nondilated. Vascular/Lymphatic: No significant vascular findings are present. No enlarged abdominal or pelvic lymph nodes. Reproductive: Status post hysterectomy. No adnexal masses. Other: Small amount of free fluid in the pelvis. Again scattered free air, central mesenteric stranding. Musculoskeletal: Curvature of the lumbar spine with moderate degenerative changes. Pars defects at L5 with grade 2 anterolisthesis of L5 on S1. Multilevel disc bulging and stenosis along the spine. Critical Value/emergent results were called by telephone at the time of interpretation on 12/07/2023 at 4:54 pm to provider Dr. Wilkie Aye, who verbally acknowledged these results. IMPRESSION: Free air identified greatest surrounding the mid sigmoid colon where there is wall thickening, stranding and  some clips. Please correlate for focal bowel perforation of the colon. No associated bowel obstruction. No extraluminal rim enhancing fluid collection suggested. Scattered stranding and trace simple free fluid in the pelvis. Previous cholecystectomy. Small hiatal hernia. Surgical changes along the rectum. Small slightly  complex cystic lesion along the right mid kidney. Too small to completely characterize. Recommend simple follow-up CT in 6 months with without contrast. If there is more clinical concern at this time a follow up MRI could be considered when clinically appropriate. Electronically Signed   By: [name] Kays M.D.   On: 12/07/2023 16:56   DG Abdomen Acute W/Chest Result Date: 12/07/2023 CLINICAL DATA:  Bowel perforation. EXAM: DG ABDOMEN ACUTE WITH 1 VIEW CHEST COMPARISON:  Chest x-ray 09/02/2016 FINDINGS: Underinflation. No consolidation, pneumothorax or effusion. No edema. Normal cardiopericardial silhouette. Surgical clips in the right axillary region. Overlapping cardiac leads. Degenerative changes of the spine and shoulders. Metallic foci along the pelvis, possible clips. There is also presumed cholecystectomy clips in the right upper quadrant. There is gaseous distention of loops of colon the mid abdomen. Few mildly dilated loops of small bowel left lower quadrant. There appears to be some free air beneath the diaphragm. Please see separate CT scan from same day. Moderate degenerative changes of the spine with curvature. Presumed vascular calcifications in the pelvis. IMPRESSION: Nonspecific bowel gas pattern with a few distended loops of air-filled small and large bowel. There does appear to be free air however beneath the diaphragm and recommend correlation with the separate CT scan from same day. Underinflation of the lungs.  No consolidation or effusion. Metallic clips in the central pelvis and right upper quadrant of the abdomen. Electronically Signed   By: [name] Kays M.D.   On: 12/07/2023 16:48     Labs:   Basic Metabolic Panel: Recent Labs  Lab 12/07/23 2340 12/08/23 0700 12/09/23 0725 12/10/23 0724 12/11/23 0744  NA 141 138 136 139 137  K 3.5 3.1* 2.7* 3.3* 3.8  CL 109 107 107 110 112*  CO2 20* 22 24 23 22   GLUCOSE 154* 172* 97 103* 88  BUN 7* 9 15 13 11   CREATININE 0.66 0.63 0.86  0.72 0.53  CALCIUM 7.8* 7.7* 7.4* 7.2* 7.2*  MG 1.5*  --  2.4 2.3 2.0   GFR Estimated Creatinine Clearance: 54.9 mL/min (by C-G formula based on SCr of 0.53 mg/dL). Liver Function Tests: Recent Labs  Lab 12/07/23 1254  AST 24  ALT 17  ALKPHOS 54  BILITOT 0.5  PROT 6.5  ALBUMIN 3.9   No results for input(s): "LIPASE", "AMYLASE" in the last 168 hours. No results for input(s): "AMMONIA" in the last 168 hours. Coagulation profile No results for input(s): "INR", "PROTIME" in the last 168 hours.  CBC: Recent Labs  Lab 12/07/23 1254 12/08/23 0700 12/09/23 0725 12/10/23 0724 12/11/23 0744  WBC 3.7* 11.3* 13.2* 9.9 6.4  NEUTROABS 1.7  --   --   --   --   HGB 13.2 10.6* 9.4* 9.7* 9.8*  HCT 41.7 33.0* 28.9* 29.7* 29.8*  MCV 82.4 81.3 81.4 80.7 81.0  PLT 215 152 162 172 204   Cardiac Enzymes: No results for input(s): "CKTOTAL", "CKMB", "CKMBINDEX", "TROPONINI" in the last 168 hours. BNP: Invalid input(s): "POCBNP" CBG: No results for input(s): "GLUCAP" in the last 168 hours. D-Dimer No results for input(s): "DDIMER" in the last 72 hours. Hgb A1c No results for input(s): "HGBA1C" in the last 72 hours. Lipid Profile No results for input(s): "CHOL", "  HDL", "LDLCALC", "TRIG", "CHOLHDL", "LDLDIRECT" in the last 72 hours. Thyroid function studies No results for input(s): "TSH", "T4TOTAL", "T3FREE", "THYROIDAB" in the last 72 hours.  Invalid input(s): "FREET3" Anemia work up No results for input(s): "VITAMINB12", "FOLATE", "FERRITIN", "TIBC", "IRON", "RETICCTPCT" in the last 72 hours. Microbiology No results found for this or any previous visit (from the past 240 hours).   Discharge Instructions:   Discharge Instructions     Call MD for:  persistant nausea and vomiting   Complete by: As directed    Call MD for:  redness, tenderness, or signs of infection (pain, swelling, redness, odor or green/yellow discharge around incision site)   Complete by: As directed    Call MD  for:  severe uncontrolled pain   Complete by: As directed    Call MD for:  temperature >100.4   Complete by: As directed    Diet - low sodium heart healthy   Complete by: As directed    Discharge instructions   Complete by: As directed    Follow up with your primary care provider in one week. Check blood work at that time. Follow up with general surgery as scheduled by the clinic.  Follow-up with Dr. Marca Ancona GI in 3 to 4 weeks.  Seek medical attention for worsening symptoms. Low fiber diet.   Increase activity slowly   Complete by: As directed       Allergies as of 12/11/2023       Reactions   Zantac [ranitidine Hcl] Anaphylaxis, Swelling   Alendronate Diarrhea   Burping   Morphine And Codeine Nausea And Vomiting   Morphine, Hydrocodone Tolerates Dilaudid, Fentanyl per Epic   Prednisone Diarrhea, Nausea And Vomiting   Tramadol Nausea And Vomiting   AMS        Medication List     TAKE these medications    amitriptyline 10 MG tablet Commonly known as: ELAVIL TAKE 1 TABLET BY MOUTH EVERYDAY AT BEDTIME   aspirin-acetaminophen-caffeine 250-250-65 MG tablet Commonly known as: EXCEDRIN MIGRAINE Take 1 tablet by mouth as needed for headache or migraine.   atorvastatin 10 MG tablet Commonly known as: LIPITOR Take 10 mg by mouth in the morning.   Calcium Carb-Cholecalciferol 600-20 MG-MCG Tabs Take 1 tablet by mouth daily at 12 noon.   diclofenac sodium 1 % Gel Commonly known as: VOLTAREN Apply 1 application  topically daily as needed (KNEE PAIN).   methocarbamol 750 MG tablet Commonly known as: ROBAXIN Take 1 tablet (750 mg total) by mouth every 8 (eight) hours as needed (use for muscle cramps/pain).   metoprolol tartrate 25 MG tablet Commonly known as: LOPRESSOR TAKE 1 TABLET BY MOUTH TWICE A DAY   multivitamin with minerals tablet Take 1 tablet by mouth daily.   oxyCODONE 5 MG immediate release tablet Commonly known as: Oxy IR/ROXICODONE Take 1 tablet (5 mg  total) by mouth every 4 (four) hours as needed for moderate pain (pain score 4-6).   Prolia 60 MG/ML Sosy injection Generic drug: denosumab Inject 60 mg into the skin every 6 (six) months.   VITAMIN C PO Take 2 tablets by mouth daily.        Follow-up Information     Fritzi Mandes, MD Follow up on 12/28/2023.   Specialty: General Surgery Why: 150 pm Contact information: [address] [place] [address] [phone]         Sigmund Hazel, MD Follow up in 1 week(s).   Specialty: Family Medicine Contact  information: [address] [place] [address] [phone]         Kerin Salen, MD Follow up in 4 week(s).   Specialty: Gastroenterology Contact information: [address] [place] [address] [phone]                  Time coordinating discharge: 39 minutes  Signed:  Hadia Minier  [facility] 12/11/2023, 11:09 AM

## 2023-12-11 NOTE — Progress Notes (Addendum)
 Mobility Specialist: Progress Note   12/11/23 1228  Mobility  Activity Ambulated independently in hallway  Level of Assistance Modified independent, requires aide device or extra time  Assistive Device Other (Comment) (IV pole)  Distance Ambulated (ft) 300 ft  Activity Response Tolerated well  Mobility Referral Yes  Mobility visit 1 Mobility  Mobility Specialist Start Time (ACUTE ONLY) 1000  Mobility Specialist Stop Time (ACUTE ONLY) 1011  Mobility Specialist Time Calculation (min) (ACUTE ONLY) 11 min    Received pt in bed having no complaints and agreeable to mobility. Pt was asymptomatic throughout ambulation and returned to room w/o fault. Left on EOB w/ call bell in reach and all needs met.  Maurene Capes Mobility Specialist Please contact via SecureChat or Rehab office at [phone]

## 2023-12-11 NOTE — Plan of Care (Signed)

## 2023-12-11 NOTE — Progress Notes (Signed)
 AVS printed and reviewed with patient questions answered, antibiotics finishing .

## 2023-12-11 NOTE — Progress Notes (Signed)
 4 Days Post-Op   Subjective/Chief Complaint: Having bowel function, tol diet   Objective: Vital signs in last 24 hours: Temp:  [97.6 F (36.4 C)-98 F (36.7 C)] 98 F (36.7 C) (03/07 0812) Pulse Rate:  [44-85] 85 (03/07 0812) Resp:  [16-18] 18 (03/07 0812) BP: (107-148)/(65-92) 148/92 (03/07 0812) SpO2:  [97 %-100 %] 100 % (03/07 0812) Weight:  [70.4 kg] 70.4 kg (03/07 0500) Last BM Date : 12/09/23  Intake/Output from previous day: 03/06 0701 - 03/07 0700 In: 240 [P.O.:240] Out: -  Intake/Output this shift: No intake/output data recorded.  Ab soft dressing removed and clean, approp tender  Lab Results:  Recent Labs    12/10/23 0724 12/11/23 0744  WBC 9.9 6.4  HGB 9.7* 9.8*  HCT 29.7* 29.8*  PLT 172 204   BMET Recent Labs    12/10/23 0724 12/11/23 0744  NA 139 137  K 3.3* 3.8  CL 110 112*  CO2 23 22  GLUCOSE 103* 88  BUN 13 11  CREATININE 0.72 0.53  CALCIUM 7.2* 7.2*   PT/INR No results for input(s): "LABPROT", "INR" in the last 72 hours. ABG No results for input(s): "PHART", "HCO3" in the last 72 hours.  Invalid input(s): "PCO2", "PO2"  Studies/Results: No results found.  Anti-infectives: Anti-infectives (From admission, onward)    Start     Dose/Rate Route Frequency Ordered Stop   12/08/23 0000  piperacillin-tazobactam (ZOSYN) IVPB 3.375 g  Status:  Discontinued        3.375 g 12.5 mL/hr over 240 Minutes Intravenous Every 8 hours 12/07/23 2157 12/07/23 2311   12/07/23 2330  piperacillin-tazobactam (ZOSYN) IVPB 3.375 g        3.375 g 12.5 mL/hr over 240 Minutes Intravenous Every 8 hours 12/07/23 2120     12/07/23 1345  piperacillin-tazobactam (ZOSYN) IVPB 3.375 g        3.375 g 100 mL/hr over 30 Minutes Intravenous  Once 12/07/23 1341 12/07/23 1541       Assessment/Plan: POD 4 colorraphy after colonoscopic perforation- SA -soft food -oob, pulm toilet -home meds -lovenox, scds -can dc home today   Emelia Loron 12/11/2023

## 2023-12-11 NOTE — Progress Notes (Signed)
 [facility] Progress Note  SUBJECTIVE:   Interval history: Meghan Avila was seen and evaluated today at bedside.  Resting comfortably in bed.  Noted had a large bowel movement today.  Tolerating diet.  Drain was removed yesterday.  Past Medical History:  Diagnosis Date   Arthritis    Family history of adverse reaction to anesthesia    post nausea and vomitting   GERD (gastroesophageal reflux disease)    hx of   H/O wheezing    occ.   Hand swelling    related to lymph nodes    Headache    migraines   History of hiatal hernia    Leg swelling    left leg -edema knee level to ankle.   Osteoporosis    Peripheral vascular disease (HCC)    left leg    PONV (postoperative nausea and vomiting)    Scoliosis    Vertigo    Past Surgical History:  Procedure Laterality Date   ANAL RECTAL MANOMETRY N/A 08/03/2019   Procedure: ANO RECTAL MANOMETRY;  Surgeon: Romie Levee, MD;  Location: [facility];  Service: Endoscopy;  Laterality: N/A;   BACK SURGERY     scoliosis surgery 1970 and 1972   BREAST REDUCTION SURGERY  1997   CHOLECYSTECTOMY  1992   laparoscopic   COLONOSCOPY WITH PROPOFOL N/A 04/19/2015   Procedure: COLONOSCOPY WITH PROPOFOL;  Surgeon: Charolett Bumpers, MD;  Location: [facility];  Service: Endoscopy;  Laterality: N/A;   DEEP AXILLARY SENTINEL NODE BIOPSY / EXCISION     x2   ESOPHAGEAL MANOMETRY N/A 05/04/2015   Procedure: ESOPHAGEAL MANOMETRY (EM);  Surgeon: Charolett Bumpers, MD;  Location: [facility];  Service: Endoscopy;  Laterality: N/A;   ESOPHAGOGASTRODUODENOSCOPY (EGD) WITH PROPOFOL N/A 04/19/2015   Procedure: ESOPHAGOGASTRODUODENOSCOPY (EGD) WITH PROPOFOL;  Surgeon: Charolett Bumpers, MD;  Location: [facility];  Service: Endoscopy;  Laterality: N/A;   HEMORROIDECTOMY  2002, 2005   hiatal hernia surgery      JOINT REPLACEMENT     total right knee Dr. Thomasena Edis 02-26-18   LAPAROTOMY N/A 12/07/2023   Procedure: EXPLORATORY LAPAROTOMY;   Surgeon: Fritzi Mandes, MD;  Location: [facility];  Service: General;  Laterality: N/A;   LASER ABLATION  2008   left leg    left knee surgery - torn meniscus (left)     LIPOMA EXCISION N/A 09/21/2015   Procedure: EXCISION 6CM BACK LIPOMA;  Surgeon: Axel Filler, MD;  Location: [facility];  Service: General;  Laterality: N/A;   MENISCUS REPAIR     right   REDUCTION MAMMAPLASTY Bilateral 1998   right shoulder rotator cuff surgery      ROBOTIC ASSISTED LAPAROSCOPIC SACROCOLPOPEXY N/A 05/30/2021   Procedure: XI ROBOTIC ASSISTED LAPAROSCOPIC SACROCOLPOPEXY;  Surgeon: Crist Fat, MD;  Location: [facility];  Service: Urology;  Laterality: N/A;   scoliosis  1970, 1972   no retained hardware   TOTAL KNEE ARTHROPLASTY Right 02/26/2018   Procedure: RIGHT TOTAL KNEE ARTHROPLASTY;  Surgeon: Eugenia Mcalpine, MD;  Location: [facility];  Service: Orthopedics;  Laterality: Right;   Current Facility-Administered Medications  Medication Dose Route Frequency Provider Last Rate Last Admin   acetaminophen (TYLENOL) tablet 1,000 mg  1,000 mg Oral TID Fritzi Mandes, MD   1,000 mg at 12/11/23 0907   amitriptyline (ELAVIL) tablet 10 mg  10 mg Oral Benjamin Stain, MD   10 mg at 12/10/23 2214   atorvastatin (LIPITOR) tablet 10 mg  10 mg Oral q  AM Pokhrel, Rebekah Chesterfield, MD   10 mg at 12/11/23 0603   diphenhydrAMINE (BENADRYL) 12.5 MG/5ML elixir 12.5 mg  12.5 mg Oral Q6H PRN Fritzi Mandes, MD       Or   diphenhydrAMINE (BENADRYL) injection 12.5 mg  12.5 mg Intravenous Q6H PRN Fritzi Mandes, MD       enoxaparin (LOVENOX) injection 40 mg  40 mg Subcutaneous Q24H Sophronia Simas L, MD   40 mg at 12/11/23 0906   HYDROmorphone (DILAUDID) injection 0.5 mg  0.5 mg Intravenous Q3H PRN Sophronia Simas L, MD   0.5 mg at 12/08/23 0042   methocarbamol (ROBAXIN) injection 500 mg  500 mg Intravenous Veryl Speak, MD   500 mg at 12/11/23 0603   metoprolol tartrate (LOPRESSOR) tablet 25 mg  25 mg Oral BID Emelia Loron, MD   25 mg at 12/11/23 0908   ondansetron (ZOFRAN) tablet 4 mg  4 mg Oral Q6H PRN Fritzi Mandes, MD   4 mg at 12/09/23 1024   Or   ondansetron [facility]) injection 4 mg  4 mg Intravenous Q6H PRN Fritzi Mandes, MD   4 mg at 12/08/23 1448   oxyCODONE (Oxy IR/ROXICODONE) immediate release tablet 5-10 mg  5-10 mg Oral Q4H PRN Fritzi Mandes, MD   5 mg at 12/09/23 1024   piperacillin-tazobactam (ZOSYN) IVPB 3.375 g  3.375 g Intravenous Q8H Doutova, Anastassia, MD 12.5 mL/hr at 12/11/23 0914 3.375 g at 12/11/23 0914   Allergies as of 12/07/2023 - Review Complete 12/07/2023  Allergen Reaction Noted   Zantac [ranitidine hcl] Anaphylaxis 02/05/2015   Alendronate  11/04/2023   Morphine and codeine Nausea And Vomiting 02/05/2015   Prednisone Diarrhea and Nausea And Vomiting 08/11/2011   Ranitidine Swelling 11/04/2023   Tramadol Nausea And Vomiting 02/05/2015   Review of Systems:  Review of Systems  Respiratory:  Negative for shortness of breath.   Cardiovascular:  Negative for chest pain.  Gastrointestinal:  Positive for diarrhea. Negative for nausea and vomiting.    OBJECTIVE:   Temp:  [97.6 F (36.4 C)-98 F (36.7 C)] 98 F (36.7 C) (03/07 0812) Pulse Rate:  [44-85] 85 (03/07 0812) Resp:  [16-18] 18 (03/07 0812) BP: (107-148)/(65-92) 148/92 (03/07 0812) SpO2:  [97 %-100 %] 100 % (03/07 0812) Weight:  [70.4 kg] 70.4 kg (03/07 0500) Last BM Date : 12/09/23 Physical Exam Constitutional:      General: She is not in acute distress.    Appearance: She is not ill-appearing, toxic-appearing or diaphoretic.  Cardiovascular:     Rate and Rhythm: Normal rate and regular rhythm.  Pulmonary:     Effort: No respiratory distress.     Breath sounds: Normal breath sounds.  Abdominal:     General: Bowel sounds are normal. There is no distension.     Palpations: Abdomen is soft.     Tenderness: There is no abdominal tenderness. There is no guarding.  Neurological:     Mental Status:  She is alert.     Labs: Recent Labs    12/09/23 0725 12/10/23 0724 12/11/23 0744  WBC 13.2* 9.9 6.4  HGB 9.4* 9.7* 9.8*  HCT 28.9* 29.7* 29.8*  PLT 162 172 204   BMET Recent Labs    12/09/23 0725 12/10/23 0724 12/11/23 0744  NA 136 139 137  K 2.7* 3.3* 3.8  CL 107 110 112*  CO2 24 23 22   GLUCOSE 97 103* 88  BUN 15 13 11   CREATININE 0.86 0.72 0.53  CALCIUM 7.4* 7.2* 7.2*   LFT No results for input(s): "PROT", "ALBUMIN", "AST", "ALT", "ALKPHOS", "BILITOT", "BILIDIR", "IBILI" in the last 72 hours. PT/INR No results for input(s): "LABPROT", "INR" in the last 72 hours. Diagnostic imaging: No results found.  IMPRESSION: Colonic perforation in rectosigmoid colon -Outpatient diagnostic colonoscopy 12/07/2023 for personal history of colon polyps -POD#4 s/p colorraphy  Abdominal pain secondary to above, improved History of left-sided diverticular disease History of internal and external hemorrhoids  PLAN: -General Surgery management -Outpatient follow-up with Dr. Marca Ancona within 3-4 of discharge from hospital    LOS: 4 days   Liliane Shi, [facility]

## 2023-12-21 DIAGNOSIS — E876 Hypokalemia: Secondary | ICD-10-CM | POA: Diagnosis not present

## 2023-12-21 DIAGNOSIS — Z9889 Other specified postprocedural states: Secondary | ICD-10-CM | POA: Diagnosis not present

## 2023-12-21 DIAGNOSIS — N281 Cyst of kidney, acquired: Secondary | ICD-10-CM | POA: Diagnosis not present

## 2023-12-21 DIAGNOSIS — R1031 Right lower quadrant pain: Secondary | ICD-10-CM | POA: Diagnosis not present

## 2024-01-10 ENCOUNTER — Other Ambulatory Visit: Payer: Self-pay | Admitting: Neurology

## 2024-01-13 DIAGNOSIS — M19012 Primary osteoarthritis, left shoulder: Secondary | ICD-10-CM | POA: Diagnosis not present

## 2024-01-13 DIAGNOSIS — M19011 Primary osteoarthritis, right shoulder: Secondary | ICD-10-CM | POA: Diagnosis not present

## 2024-01-14 ENCOUNTER — Other Ambulatory Visit: Payer: Self-pay

## 2024-01-14 ENCOUNTER — Other Ambulatory Visit: Payer: Self-pay | Admitting: Gastroenterology

## 2024-01-14 DIAGNOSIS — Z8719 Personal history of other diseases of the digestive system: Secondary | ICD-10-CM | POA: Diagnosis not present

## 2024-01-14 DIAGNOSIS — D509 Iron deficiency anemia, unspecified: Secondary | ICD-10-CM | POA: Diagnosis not present

## 2024-01-14 DIAGNOSIS — Z860100 Personal history of colon polyps, unspecified: Secondary | ICD-10-CM | POA: Diagnosis not present

## 2024-01-14 DIAGNOSIS — G4733 Obstructive sleep apnea (adult) (pediatric): Secondary | ICD-10-CM | POA: Diagnosis not present

## 2024-02-02 DIAGNOSIS — R1031 Right lower quadrant pain: Secondary | ICD-10-CM | POA: Diagnosis not present

## 2024-02-02 DIAGNOSIS — I872 Venous insufficiency (chronic) (peripheral): Secondary | ICD-10-CM | POA: Diagnosis not present

## 2024-02-02 DIAGNOSIS — M25511 Pain in right shoulder: Secondary | ICD-10-CM | POA: Diagnosis not present

## 2024-02-02 DIAGNOSIS — Z6828 Body mass index (BMI) 28.0-28.9, adult: Secondary | ICD-10-CM | POA: Diagnosis not present

## 2024-02-09 NOTE — Progress Notes (Addendum)
 PATIENT: Meghan Avila DOB: [birth year]  REASON FOR VISIT: follow up HISTORY FROM: patient  Chief Complaint  Patient presents with   Follow-up    Pt in alone Pt here for cpap f/u Pt states didn't wear cpap for 6 weeks due to surgery      HISTORY OF PRESENT ILLNESS:  02/11/24 ALL:  Meghan Avila returns for follow up for OSA on CPAP. She was last seen by me 07/2023 and had not resumed CPAP. Irregular heart rhythm auscultated on exam and follow up with PCP advised. She was evaluated by PCP and referred to cardiology. Two week monitor showed 11% PVC burden and two runs of NSVT. She was started on metoprolol  25mg  BID.   Since, she reports doing fairly well. She is tolerating metoprolol . She is s/p ex lap for perforated sigmoid colon during colonoscopy. She is recovering well. She had not used CPAP for a few weeks around surgical procedure but has recently resumed and doing well. She reports receiving a new machine 01/18/2024 as her machine was not functioning correctly. She reports unusual noises. DME swapped it out. No further concerns. She has needed supplies. She is sleeping well most nights. Her cats sleep with her and have woken her in early morning hours. Migraines have been stable. May take Excedrin  1-2 times a month.   She reports thinking PCP evaluated labs for pituitary monitoring. She will follow up with Dr Cleotilde.     07/21/2023 ALL: Meghan Avila is a 73 y.o. female here today for follow up for OSA on CPAP.  She was last seen by Dr Buck 12/2022 and reported not using CPAP. She was interested in resuming therapy at that time. Since, she reports not using therapy. She lost her mother in 03/2023 at the age of 17. She is adjusting to new schedule and has not resumed therapy. She has not noted any significant benefit of using CPAP but does endorse desire to restart. She has all the supplies she needs. No concerns with machine. Now using a nasal pillow style mask.   She  does report family history of atrial fib. She does occasionally have palpitations. She drinks one cup of coffee daily. No shob or chest pain. She exercises regularly. She has discussed abnormal MRI with PCP. She is not currently followed by endo.    HISTORY: (copied from Dr Obie previous note)  Meghan Avila is a 73 year old right-handed woman with an underlying medical history of reflux disease, osteoporosis, peripheral vascular disease, peripheral edema, vitamin D  deficiency, diverticulosis, arthritis with status post right knee replacement, scoliosis (s/p rod placement at age 89, s/p rod removal at age 83), borderline obesity, and vertigo, who presents for follow-up consultation of her migraine and sleep apnea, on autoPAP therapy. The patient is unaccompanied today. I last saw her on 05/14/2022 , at which time she reported doing a little better with her CPAP machine, she has adjusted better.  She reported stress.  She reported intermittent vertigo symptoms for which she had been to PT. She was encouraged to drink more water .    She had an interim repeat brain MRI with and without contrast on 12/03/2022 and I reviewed the results: IMPRESSION: Unchanged intrinsically T1 hyperintense lesion at the base of the infundibulum measuring 5 x 3 x 4 mm. If the patient has clinical signs of growth hormone deficiency this could represent an ectopic pituitary. However in the absence of endocrine dysfunction pituitary adenoma remains a differential consideration. She was notified by phone  call and encouraged to see her PCP and potentially also endocrinology.   Today, 12/31/2022: I reviewed her AutoPap compliance data from the past year.  She essentially stopped using her CPAP completely in December 2023.  She had sporadic years starting in September 2023.  She reports that in December her mom moved in with her.  She has disrupted sleep as she has to assist her mom even overnight.  Mom is 57 years old.  Patient gets  about 4 hours of sleep on an average night she reports.  She endorses stress.  She does not hydrate quite as well but does report that she has done a little better with water  intake.  She no longer takes the amitriptyline .  She has occasional headaches, including morning headaches.  She had a checkup with primary care and had blood work done.  She has not been referred to an endocrinologist but will double check with primary care.  She is interested in getting back on her AutoPap machine.  She will need new supplies.  She had updated eye exam in October 2023 and received new eyeglasses.   The patient's allergies, current medications, family history, past medical history, past social history, past surgical history and problem list were reviewed and updated as appropriate.    Previously:   I saw her on 10/09/21, at which time she reported struggling with AutoPap therapy, she had difficulty keeping the mask on.  We had changed her pressure to help with tolerance.  She was waking up with headaches.  I suggested she start gabapentin .    She saw Harlene Bogaert, NP in the interim on 01/08/2022, at which time she was good with compliance, she reported that she had not started the gabapentin  due to concern for side effects.  She had a recent episode of vertigo.  She was advised to start amitriptyline  at night for migraine prevention.    I reviewed her AutoPap compliance data from 04/02/2022 through 05/01/2022, which is a total of 30 days, during which time she used her machine only 19 days with percent usage days greater than 4 hours at 50%, indicating suboptimal compliance, average usage of 5 hours and 45 minutes, residual AHI: 0.5/h, 95th percentile pressure at 8.8 cm with a range of 4 to 9 cm, leak on the higher side with a 95th percentile at 27.5 L/min.  In the past 90 days, she has had a similar compliance, no usage after 05/01/2022.       I first met her at the request of her primary care physician on 04/18/2021,  at which time she reported a 2 to 3-year history of migrainous headaches.  He also reported intermittent vertigo attacks since April 2022.  She had worked with physical therapy and had seen ENT.  I suggested we proceed with a sleep study as she reported snoring and morning headaches as well as not sleeping well at night.  She had a home sleep test on 05/22/2021, which indicated severe obstructive sleep apnea with an AHI of 38.9/h, O2 nadir 77%.  She was advised to start AutoPap therapy.  Her set up date was 07/16/2021.  She has a ResMed air sense 11.   She had recently undergone a brain MRI without contrast, and I reviewed the results: IMPRESSION: 1. No evidence of acute infarct. 2. Thickening of the pituitary infundibulum. Recommend pituitary protocol MRI with contrast to further characterize and to exclude a mass. 3. Mild to moderate chronic microvascular ischemic disease and mild atrophy. 4.  Small right mastoid effusion.   Given the findings of the pituitary infundibulum she had a repeat brain MRI with and without contrast on 04/29/2021 and I reviewed the results:   IMPRESSION: 1. Abnormal thickening of the infundibulum, which appears intrinsically T1 hyperintense posteriorly. Additionally, a normal T1 bright spot in the posterior pituitary is not identified. These findings most likely are related to an ectopic posterior pituitary or a benign proteinaceous cyst (such as a Rathke's cleft cyst). Other etiologies such as an infundibular malignancy or cellular infiltrate are thought unlikely given the intrinsic T1 signal. A follow-up pituitary protocol MRI in approximately 6 months may be useful to ensure stability. Also, consider correlation with pituitary function labs. 2. No abnormal enhancement elsewhere.     I reviewed her AutoPap compliance data from 09/08/2021 through 10/08/2021, which is a total of 30 days, during which time she used her machine 29 days with percent used days greater than  4 hours at 90%, indicating excellent compliance with an average usage of 6 hours and 23 minutes, residual AHI at goal at 1.6/h, leak on the high side with a 95th percentile at 28.9 L/min, 95th percentile of press, range ofsure at 8.8 cm, range of 4-9 cm. We reduced her Maximum pressure in November to 9 cm from 10 cm.  She felt that she was waking up with headaches.  She reports that she does not sleep well with the AutoPap and struggles with it.  She cannot sleep on her back because of her scoliosis and when she sleeps on her side the mask dislodges and sometimes she just takes it off in the middle of the night. She is motivated to continue with treatment but is still struggling.  She takes melatonin every night.  She has had recurrent headaches, they are not always severe but she has taken Excedrin  migraine or tension headaches.  She has not had an actual bout of vertigo but sometimes she feels that a vertigo attack may come on when she turns in bed.   The patient's allergies, current medications, family history, past medical history, past social history, past surgical history and problem list were reviewed and updated as appropriate.      Previously:    04/18/21: (She) reports a history of migrainous headaches for the past 2 to 3 years.  When she first started having migraines her severity was worse.  Lately, she has up to 3 or 4 migraine headaches per month.  She is trying to keep a log.  She describes a throbbing headache, it is primarily in the frontal areas bilaterally, denies any temporal tenderness or neck pain.  She has suffered from intermittent vertigo.  She has completed physical therapy for 4 weeks, without significant improvement.  Her vertigo attacks started back in April 2022 when she had another attack in May 2022.  Symptoms lasted for several days in May.  They were shorter in April.  She had associated nausea and headaches and vertigo do not typically come together.  She has had intermittent  tingling and numbness in her right hand but it seems to be independent of her headaches.  Excedrin  Migraine helps her headaches, it does not last several hours or days.  In fact, after taking Excedrin  and staying in bed she has no significant residual headache.  She has woken up with a headache typically, headaches are not strictly one-sided.  She had an eye examination last year and is due for October 2022 for her routine follow-up.  She  does not have any new visual symptoms no sudden onset of one-sided weakness or numbness or tingling or droopy face or slurring of speech other than intermittent tingling and numbness in her right hand.  She has not fallen.  She limits her caffeine  to 1 cup of coffee.  She drinks alcohol rarely.  She quit smoking some 36 years ago.  She lives alone, husband died some 8 years ago, her only son lives with his family in [place] .  She does not hydrate very well.  She estimates that she drinks about 3 to 4 cups of water  per day.     Of note, she thinks she snores when she sleeps on her back.  She has nocturia about once or twice per average night.  She does not sleep well at night.  She takes melatonin.  She would be reluctant to come in for sleep study but would be willing to consider a home sleep test.   I reviewed your office note from 04/12/2021.  She has had several bouts of vertigo in the past several months.  She saw Dr. Vaughan Ricker in ENT in June 2022 and hearing testing as well as Trenda Craze testing was recommended.  She has had mild hearing loss.  She had further testing through audiology on 04/05/2021 and was found to have mild hearing loss on the right.  Dix-Hallpike was positive bilaterally.  She has had intermittent numbness in her hand.  You ordered a brain MRI.  She had a brain MRI without contrast at [facility] on 04/17/2021 and I was able to review some of the images through the PACS system but not all of them.  A formal report is pending.  I did not see  any obvious abnormality.   She previously had physical therapy with vestibular rehab with modest results.  She was treated for COVID in May 2022 with Paxlovid.   She reports a family history of migraines affecting her mom and her sister.  She also reports stress, she helps take care of her 74 year old mother.   REVIEW OF SYSTEMS: Out of a complete 14 system review of symptoms, the patient complains only of the following symptoms, none and all other reviewed systems are negative.  ESS: 0/24  ALLERGIES: Allergies  Allergen Reactions   Zantac [Ranitidine Hcl] Anaphylaxis and Swelling   Alendronate Diarrhea    Burping   Morphine  And Codeine Nausea And Vomiting    Morphine , Hydrocodone  Tolerates Dilaudid , Fentanyl  per Epic   Prednisone Diarrhea and Nausea And Vomiting   Tramadol Nausea And Vomiting    AMS    HOME MEDICATIONS: Outpatient Medications Prior to Visit  Medication Sig Dispense Refill   amitriptyline  (ELAVIL ) 10 MG tablet TAKE 1 TABLET BY MOUTH EVERYDAY AT BEDTIME 90 tablet 1   Ascorbic Acid (VITAMIN C PO) Take 2 tablets by mouth daily.     aspirin -acetaminophen -caffeine  (EXCEDRIN  MIGRAINE) 250-250-65 MG tablet Take 1 tablet by mouth as needed for headache or migraine.     atorvastatin  (LIPITOR) 10 MG tablet Take 10 mg by mouth in the morning.     Calcium  Carb-Cholecalciferol 600-20 MG-MCG TABS Take 1 tablet by mouth daily at 12 noon.     denosumab  (PROLIA ) 60 MG/ML SOSY injection Inject 60 mg into the skin every 6 (six) months.     diclofenac sodium (VOLTAREN) 1 % GEL Apply 1 application  topically daily as needed (KNEE PAIN).     methocarbamol  (ROBAXIN ) 750 MG tablet Take 1 tablet (750  mg total) by mouth every 8 (eight) hours as needed (use for muscle cramps/pain). 30 tablet 0   metoprolol  tartrate (LOPRESSOR ) 25 MG tablet TAKE 1 TABLET BY MOUTH TWICE A DAY 180 tablet 1   Multiple Vitamins-Minerals (MULTIVITAMIN WITH MINERALS) tablet Take 1 tablet by mouth daily.      oxyCODONE  (OXY IR/ROXICODONE ) 5 MG immediate release tablet Take 1 tablet (5 mg total) by mouth every 4 (four) hours as needed for moderate pain (pain score 4-6). 12 tablet 0   No facility-administered medications prior to visit.    PAST MEDICAL HISTORY: Past Medical History:  Diagnosis Date   Arthritis    Family history of adverse reaction to anesthesia    post nausea and vomitting   GERD (gastroesophageal reflux disease)    hx of   H/O wheezing    occ.   Hand swelling    related to lymph nodes    Headache    migraines   History of hiatal hernia    Leg swelling    left leg -edema knee level to ankle.   Osteoporosis    Peripheral vascular disease (HCC)    left leg    PONV (postoperative nausea and vomiting)    Scoliosis    Vertigo     PAST SURGICAL HISTORY: Past Surgical History:  Procedure Laterality Date   ANAL RECTAL MANOMETRY N/A 08/03/2019   Procedure: ANO RECTAL MANOMETRY;  Surgeon: Debby Hila, MD;  Location: [facility];  Service: Endoscopy;  Laterality: N/A;   BACK SURGERY     scoliosis surgery 1970 and 1972   BREAST REDUCTION SURGERY  1997   CHOLECYSTECTOMY  1992   laparoscopic   COLONOSCOPY WITH PROPOFOL  N/A 04/19/2015   Procedure: COLONOSCOPY WITH PROPOFOL ;  Surgeon: Gladis MARLA Louder, MD;  Location: [facility];  Service: Endoscopy;  Laterality: N/A;   DEEP AXILLARY SENTINEL NODE BIOPSY / EXCISION     x2   ESOPHAGEAL MANOMETRY N/A 05/04/2015   Procedure: ESOPHAGEAL MANOMETRY (EM);  Surgeon: Gladis MARLA Louder, MD;  Location: [facility];  Service: Endoscopy;  Laterality: N/A;   ESOPHAGOGASTRODUODENOSCOPY (EGD) WITH PROPOFOL  N/A 04/19/2015   Procedure: ESOPHAGOGASTRODUODENOSCOPY (EGD) WITH PROPOFOL ;  Surgeon: Gladis MARLA Louder, MD;  Location: [facility];  Service: Endoscopy;  Laterality: N/A;   HEMORROIDECTOMY  2002, 2005   hiatal hernia surgery      JOINT REPLACEMENT     total right knee Dr. gerome 02-26-18   LAPAROTOMY N/A 12/07/2023   Procedure:  EXPLORATORY LAPAROTOMY;  Surgeon: Dasie Leonor CROME, MD;  Location: [facility];  Service: General;  Laterality: N/A;   LASER ABLATION  2008   left leg    left knee surgery - torn meniscus (left)     LIPOMA EXCISION N/A 09/21/2015   Procedure: EXCISION 6CM BACK LIPOMA;  Surgeon: Lynda Leos, MD;  Location: [facility];  Service: General;  Laterality: N/A;   MENISCUS REPAIR     right   REDUCTION MAMMAPLASTY Bilateral 1998   right shoulder rotator cuff surgery      ROBOTIC ASSISTED LAPAROSCOPIC SACROCOLPOPEXY N/A 05/30/2021   Procedure: XI ROBOTIC ASSISTED LAPAROSCOPIC SACROCOLPOPEXY;  Surgeon: Cam Morene ORN, MD;  Location: [facility];  Service: Urology;  Laterality: N/A;   scoliosis  1970, 1972   no retained hardware   TOTAL KNEE ARTHROPLASTY Right 02/26/2018   Procedure: RIGHT TOTAL KNEE ARTHROPLASTY;  Surgeon: gerome Charleston, MD;  Location: [facility];  Service: Orthopedics;  Laterality: Right;    FAMILY HISTORY: Family History  Problem Relation  Age of Onset   Varicose Veins Mother    Rheum arthritis Father    Cancer Sister    Heart disease Sister        before age 87   Hypertension Sister    Varicose Veins Sister    Heart attack Sister    Rheum arthritis Sister    Allergies Sister    Hyperlipidemia Brother    Sleep apnea Brother    Breast cancer Neg Hx     SOCIAL HISTORY: Social History   Socioeconomic History   Marital status: Widowed    Spouse name: Not on file   Number of children: Not on file   Years of education: Not on file   Highest education level: Not on file  Occupational History   Occupation: Payroll Admin   Tobacco Use   Smoking status: Former    Current packs/day: 0.00    Average packs/day: 1 pack/day for 5.0 years (5.0 ttl pk-yrs)    Types: Cigarettes    Start date: 10/06/1973    Quit date: 10/06/1978    Years since quitting: 45.3   Smokeless tobacco: Never  Vaping Use   Vaping status: Never Used  Substance and Sexual Activity   Alcohol use: Yes     Comment: occ beer or glass of wine   Drug use: No   Sexual activity: Yes  Other Topics Concern   Not on file  Social History Narrative   Lives at home alone   Right handed   Caffeine : 1-2 cups/day   Retired    Chief Executive Officer [employer] investment banker Strain: Not on file  Food Insecurity: No Food Insecurity (12/08/2023)   Hunger Vital Sign    Worried About Running Out of Food in the Last Year: Never true    Ran Out of Food in the Last Year: Never true  Transportation Needs: No Transportation Needs (12/08/2023)   PRAPARE - Administrator, Civil Service (Medical): No    Lack of Transportation (Non-Medical): No  Physical Activity: Not on file  Stress: Not on file  Social Connections: Moderately Integrated (12/08/2023)   Social Connection and Isolation Panel [NHANES]    Frequency of Communication with Friends and Family: More than three times a week    Frequency of Social Gatherings with Friends and Family: More than three times a week    Attends Religious Services: More than 4 times per year    Active Member of [employer] or Organizations: Yes    Attends Banker Meetings: More than 4 times per year    Marital Status: Widowed  Intimate Partner Violence: Not At Risk (12/08/2023)   Humiliation, Afraid, Rape, and Kick questionnaire    Fear of Current or Ex-Partner: No    Emotionally Abused: No    Physically Abused: No    Sexually Abused: No     PHYSICAL EXAM  Vitals:   02/11/24 0907  BP: 117/74  Pulse: 92  Weight: 142 lb (64.4 kg)  Height: 5' (1.524 m)    Body mass index is 27.73 kg/m.  Generalized: Well developed, in no acute distress  Cardiology: frequent abnormal beats auscultated, today. normal rate, no murmur noted Respiratory: clear to auscultation bilaterally  Neurological examination  Mentation: Alert oriented to time, place, history taking. Follows all commands speech and language fluent Cranial nerve II-XII: Pupils were equal round reactive  to light. Extraocular movements were full, visual field were full  Motor: The motor testing reveals 5 over  5 strength of all 4 extremities. Good symmetric motor tone is noted throughout.  Gait and station: Gait is normal.    DIAGNOSTIC DATA (LABS, IMAGING, TESTING) - I reviewed patient records, labs, notes, testing and imaging myself where available.      No data to display           Lab Results  Component Value Date   WBC 6.4 12/11/2023   HGB 9.8 (L) 12/11/2023   HCT 29.8 (L) 12/11/2023   MCV 81.0 12/11/2023   PLT 204 12/11/2023      Component Value Date/Time   NA 137 12/11/2023 0744   NA 144 11/12/2022 1140   K 3.8 12/11/2023 0744   CL 112 (H) 12/11/2023 0744   CO2 22 12/11/2023 0744   GLUCOSE 88 12/11/2023 0744   BUN 11 12/11/2023 0744   BUN 17 11/12/2022 1140   CREATININE 0.53 12/11/2023 0744   CALCIUM  7.2 (L) 12/11/2023 0744   PROT 6.5 12/07/2023 1254   PROT 6.0 11/12/2022 1140   ALBUMIN  3.9 12/07/2023 1254   ALBUMIN  4.4 11/12/2022 1140   AST 24 12/07/2023 1254   ALT 17 12/07/2023 1254   ALKPHOS 54 12/07/2023 1254   BILITOT 0.5 12/07/2023 1254   BILITOT 0.2 11/12/2022 1140   GFRNONAA >60 12/11/2023 0744   GFRAA >60 02/27/2018 0454   No results found for: CHOL, HDL, LDLCALC, LDLDIRECT, TRIG, CHOLHDL No results found for: YHAJ8R No results found for: VITAMINB12 Lab Results  Component Value Date   TSH 1.17 03/13/2015     ASSESSMENT AND PLAN 73 y.o. year old female  has a past medical history of Arthritis, Family history of adverse reaction to anesthesia, GERD (gastroesophageal reflux disease), H/O wheezing, Hand swelling, Headache, History of hiatal hernia, Leg swelling, Osteoporosis, Peripheral vascular disease (HCC), PONV (postoperative nausea and vomiting), Scoliosis, and Vertigo. here with     ICD-10-CM   1. OSA on CPAP  G47.33 For home use only DME continuous positive airway pressure (CPAP)    2. PVC (premature ventricular  contraction)  I49.3     3. Pituitary abnormality (HCC)  E23.7     4. Recurrent headache  R51.9         Meghan Avila has not used CPAP therapy consistently over the past 2-3 months. Compliance report reveals suboptimal usage but she reports being consistent for the past two weeks. I will reevaluate download in 6-8 weeks. She was encouraged to continue using CPAP nightly and for greater than 4 hours each night. We will update supply orders as indicated. Risks of untreated sleep apnea review and education materials provided. Continue amitriptyline  10mg  daily. Healthy lifestyle habits encouraged. Continue follow up for abnormalities on MRI with PCP and or endocrinology as directed. She will follow up in 1 year, sooner if needed. She verbalizes understanding and agreement with this plan.    Orders Placed This Encounter  Procedures   For home use only DME continuous positive airway pressure (CPAP)    Heated Humidity with all supplies as needed    Length of Need:   Lifetime    Patient has OSA or probable OSA:   Yes    Is the patient currently using CPAP in the home:   Yes    Settings:   Other see comments    CPAP supplies needed:   Mask, headgear, cushions, filters, heated tubing and water  chamber     No orders of the defined types were placed in this encounter.  Greig Forbes, FNP-C 02/11/2024, 9:33 AM [facility] [address] [place], [place] [address] [phone]

## 2024-02-09 NOTE — Patient Instructions (Incomplete)
 Please continue using your CPAP regularly. While your insurance requires that you use CPAP at least 4 hours each night on 70% of the nights, I recommend, that you not skip any nights and use it throughout the night if you can. Getting used to CPAP and staying with the treatment long term does take time and patience and discipline. Untreated obstructive sleep apnea when it is moderate to severe can have an adverse impact on cardiovascular health and raise her risk for heart disease, arrhythmias, hypertension, congestive heart failure, stroke and diabetes. Untreated obstructive sleep apnea causes sleep disruption, nonrestorative sleep, and sleep deprivation. This can have an impact on your day to day functioning and cause daytime sleepiness and impairment of cognitive function, memory loss, mood disturbance, and problems focussing. Using CPAP regularly can improve these symptoms.  We will update supply orders, today. Make sure you check with Dr Annabell Key about pituitary monitoring (labs).   Follow up in 1 year

## 2024-02-10 NOTE — Progress Notes (Unsigned)
 Aaron Aas

## 2024-02-11 ENCOUNTER — Ambulatory Visit: Payer: PPO | Admitting: Family Medicine

## 2024-02-11 ENCOUNTER — Encounter: Payer: Self-pay | Admitting: Family Medicine

## 2024-02-11 VITALS — BP 117/74 | HR 92 | Ht 60.0 in | Wt 142.0 lb

## 2024-02-11 DIAGNOSIS — I493 Ventricular premature depolarization: Secondary | ICD-10-CM | POA: Diagnosis not present

## 2024-02-11 DIAGNOSIS — E237 Disorder of pituitary gland, unspecified: Secondary | ICD-10-CM | POA: Diagnosis not present

## 2024-02-11 DIAGNOSIS — R519 Headache, unspecified: Secondary | ICD-10-CM | POA: Diagnosis not present

## 2024-02-11 DIAGNOSIS — G4733 Obstructive sleep apnea (adult) (pediatric): Secondary | ICD-10-CM | POA: Diagnosis not present

## 2024-02-18 ENCOUNTER — Ambulatory Visit
Admission: RE | Admit: 2024-02-18 | Discharge: 2024-02-18 | Disposition: A | Discharge status: Home / Self Care | Source: Ambulatory Visit | Attending: Gastroenterology

## 2024-02-18 DIAGNOSIS — Z860100 Personal history of colon polyps, unspecified: Secondary | ICD-10-CM

## 2024-02-18 DIAGNOSIS — K5730 Diverticulosis of large intestine without perforation or abscess without bleeding: Secondary | ICD-10-CM | POA: Diagnosis not present

## 2024-02-19 DIAGNOSIS — N8110 Cystocele, unspecified: Secondary | ICD-10-CM | POA: Diagnosis not present

## 2024-02-19 DIAGNOSIS — N3000 Acute cystitis without hematuria: Secondary | ICD-10-CM | POA: Diagnosis not present

## 2024-02-24 DIAGNOSIS — L570 Actinic keratosis: Secondary | ICD-10-CM | POA: Diagnosis not present

## 2024-02-24 DIAGNOSIS — M25511 Pain in right shoulder: Secondary | ICD-10-CM | POA: Diagnosis not present

## 2024-02-24 DIAGNOSIS — D225 Melanocytic nevi of trunk: Secondary | ICD-10-CM | POA: Diagnosis not present

## 2024-02-24 DIAGNOSIS — L720 Epidermal cyst: Secondary | ICD-10-CM | POA: Diagnosis not present

## 2024-02-24 DIAGNOSIS — L821 Other seborrheic keratosis: Secondary | ICD-10-CM | POA: Diagnosis not present

## 2024-02-24 DIAGNOSIS — D1801 Hemangioma of skin and subcutaneous tissue: Secondary | ICD-10-CM | POA: Diagnosis not present

## 2024-03-04 DIAGNOSIS — R102 Pelvic and perineal pain: Secondary | ICD-10-CM | POA: Diagnosis not present

## 2024-03-04 DIAGNOSIS — N3000 Acute cystitis without hematuria: Secondary | ICD-10-CM | POA: Diagnosis not present

## 2024-03-08 DIAGNOSIS — Z6829 Body mass index (BMI) 29.0-29.9, adult: Secondary | ICD-10-CM | POA: Diagnosis not present

## 2024-03-08 DIAGNOSIS — M818 Other osteoporosis without current pathological fracture: Secondary | ICD-10-CM | POA: Diagnosis not present

## 2024-03-08 DIAGNOSIS — D649 Anemia, unspecified: Secondary | ICD-10-CM | POA: Diagnosis not present

## 2024-03-08 DIAGNOSIS — I4729 Other ventricular tachycardia: Secondary | ICD-10-CM | POA: Diagnosis not present

## 2024-03-08 DIAGNOSIS — I2510 Atherosclerotic heart disease of native coronary artery without angina pectoris: Secondary | ICD-10-CM | POA: Diagnosis not present

## 2024-03-08 DIAGNOSIS — I491 Atrial premature depolarization: Secondary | ICD-10-CM | POA: Diagnosis not present

## 2024-03-08 DIAGNOSIS — G43909 Migraine, unspecified, not intractable, without status migrainosus: Secondary | ICD-10-CM | POA: Diagnosis not present

## 2024-03-08 DIAGNOSIS — R109 Unspecified abdominal pain: Secondary | ICD-10-CM | POA: Diagnosis not present

## 2024-03-09 DIAGNOSIS — N3000 Acute cystitis without hematuria: Secondary | ICD-10-CM | POA: Diagnosis not present

## 2024-03-09 DIAGNOSIS — R102 Pelvic and perineal pain: Secondary | ICD-10-CM | POA: Diagnosis not present

## 2024-03-09 DIAGNOSIS — N3941 Urge incontinence: Secondary | ICD-10-CM | POA: Diagnosis not present

## 2024-03-18 DIAGNOSIS — N281 Cyst of kidney, acquired: Secondary | ICD-10-CM | POA: Diagnosis not present

## 2024-03-24 DIAGNOSIS — R102 Pelvic and perineal pain: Secondary | ICD-10-CM | POA: Diagnosis not present

## 2024-03-24 DIAGNOSIS — N3000 Acute cystitis without hematuria: Secondary | ICD-10-CM | POA: Diagnosis not present

## 2024-03-30 DIAGNOSIS — Z09 Encounter for follow-up examination after completed treatment for conditions other than malignant neoplasm: Secondary | ICD-10-CM | POA: Diagnosis not present

## 2024-03-30 DIAGNOSIS — R1031 Right lower quadrant pain: Secondary | ICD-10-CM | POA: Diagnosis not present

## 2024-04-17 ENCOUNTER — Other Ambulatory Visit: Payer: Self-pay | Admitting: Neurology

## 2024-04-19 DIAGNOSIS — D649 Anemia, unspecified: Secondary | ICD-10-CM | POA: Diagnosis not present

## 2024-04-19 NOTE — Telephone Encounter (Signed)
 Spoke to patient aware sent to Amy,NP for approval

## 2024-04-19 NOTE — Telephone Encounter (Signed)
 Pt called stating that she is in town and is wanting to know when this will be called in for her. I informed pt that it was requested but pts are still being seen and I could not promise an exact time that the medication will be sent in for her.

## 2024-04-26 DIAGNOSIS — N393 Stress incontinence (female) (male): Secondary | ICD-10-CM | POA: Diagnosis not present

## 2024-04-26 DIAGNOSIS — N952 Postmenopausal atrophic vaginitis: Secondary | ICD-10-CM | POA: Diagnosis not present

## 2024-04-26 DIAGNOSIS — R1031 Right lower quadrant pain: Secondary | ICD-10-CM | POA: Diagnosis not present

## 2024-04-26 DIAGNOSIS — Z6829 Body mass index (BMI) 29.0-29.9, adult: Secondary | ICD-10-CM | POA: Diagnosis not present

## 2024-04-28 DIAGNOSIS — M1712 Unilateral primary osteoarthritis, left knee: Secondary | ICD-10-CM | POA: Diagnosis not present

## 2024-04-28 DIAGNOSIS — M1711 Unilateral primary osteoarthritis, right knee: Secondary | ICD-10-CM | POA: Diagnosis not present

## 2024-04-28 DIAGNOSIS — M170 Bilateral primary osteoarthritis of knee: Secondary | ICD-10-CM | POA: Diagnosis not present

## 2024-04-29 ENCOUNTER — Other Ambulatory Visit: Payer: Self-pay

## 2024-04-29 ENCOUNTER — Encounter: Payer: Self-pay | Admitting: Physical Therapy

## 2024-04-29 ENCOUNTER — Ambulatory Visit: Attending: Surgery | Admitting: Physical Therapy

## 2024-04-29 DIAGNOSIS — R252 Cramp and spasm: Secondary | ICD-10-CM | POA: Insufficient documentation

## 2024-04-29 DIAGNOSIS — R293 Abnormal posture: Secondary | ICD-10-CM | POA: Diagnosis not present

## 2024-04-29 NOTE — Therapy (Addendum)
 OUTPATIENT PHYSICAL THERAPY FEMALE PELVIC EVALUATION/ DC summary   Patient Name: Meghan Avila MRN: [id number] DOB:Apr 18, 1951, 73 y.o., female Today's Date: 04/29/2024  END OF SESSION:  PT End of Session - 04/29/24 1221     Visit Number 1    Authorization Type [employer]  No Auth Required    Progress Note Due on Visit 0    PT Start Time 0940    PT Stop Time 1018    PT Time Calculation (min) 38 min    Activity Tolerance Patient tolerated treatment well;Patient limited by pain    Behavior During Therapy WFL for tasks assessed/performed          Past Medical History:  Diagnosis Date   Arthritis    Family history of adverse reaction to anesthesia    post nausea and vomitting   GERD (gastroesophageal reflux disease)    hx of   H/O wheezing    occ.   Hand swelling    related to lymph nodes    Headache    migraines   History of hiatal hernia    Leg swelling    left leg -edema knee level to ankle.   Osteoporosis    Peripheral vascular disease (HCC)    left leg    PONV (postoperative nausea and vomiting)    Scoliosis    Vertigo    Past Surgical History:  Procedure Laterality Date   ANAL RECTAL MANOMETRY N/A 08/03/2019   Procedure: ANO RECTAL MANOMETRY;  Surgeon: Debby Hila, MD;  Location: [facility];  Service: Endoscopy;  Laterality: N/A;   BACK SURGERY     scoliosis surgery 1970 and 1972   BREAST REDUCTION SURGERY  1997   CHOLECYSTECTOMY  1992   laparoscopic   COLONOSCOPY WITH PROPOFOL  N/A 04/19/2015   Procedure: COLONOSCOPY WITH PROPOFOL ;  Surgeon: Gladis MARLA Louder, MD;  Location: [facility];  Service: Endoscopy;  Laterality: N/A;   DEEP AXILLARY SENTINEL NODE BIOPSY / EXCISION     x2   ESOPHAGEAL MANOMETRY N/A 05/04/2015   Procedure: ESOPHAGEAL MANOMETRY (EM);  Surgeon: Gladis MARLA Louder, MD;  Location: [facility];  Service: Endoscopy;  Laterality: N/A;   ESOPHAGOGASTRODUODENOSCOPY (EGD) WITH PROPOFOL  N/A 04/19/2015   Procedure:  ESOPHAGOGASTRODUODENOSCOPY (EGD) WITH PROPOFOL ;  Surgeon: Gladis MARLA Louder, MD;  Location: [facility];  Service: Endoscopy;  Laterality: N/A;   HEMORROIDECTOMY  2002, 2005   hiatal hernia surgery      JOINT REPLACEMENT     total right knee Dr. gerome 02-26-18   LAPAROTOMY N/A 12/07/2023   Procedure: EXPLORATORY LAPAROTOMY;  Surgeon: Dasie Leonor CROME, MD;  Location: [facility];  Service: General;  Laterality: N/A;   LASER ABLATION  2008   left leg    left knee surgery - torn meniscus (left)     LIPOMA EXCISION N/A 09/21/2015   Procedure: EXCISION 6CM BACK LIPOMA;  Surgeon: Lynda Leos, MD;  Location: [facility];  Service: General;  Laterality: N/A;   MENISCUS REPAIR     right   REDUCTION MAMMAPLASTY Bilateral 1998   right shoulder rotator cuff surgery      ROBOTIC ASSISTED LAPAROSCOPIC SACROCOLPOPEXY N/A 05/30/2021   Procedure: XI ROBOTIC ASSISTED LAPAROSCOPIC SACROCOLPOPEXY;  Surgeon: Cam Morene ORN, MD;  Location: [facility];  Service: Urology;  Laterality: N/A;   scoliosis  1970, 1972   no retained hardware   TOTAL KNEE ARTHROPLASTY Right 02/26/2018   Procedure: RIGHT TOTAL KNEE ARTHROPLASTY;  Surgeon: gerome Charleston, MD;  Location: [facility];  Service: Orthopedics;  Laterality: Right;   Patient Active Problem List   Diagnosis Date Noted   Colon perforation (HCC) 12/07/2023   Hypokalemia 12/07/2023   Renal cyst 12/07/2023   PAC (premature atrial contraction) 09/09/2023   NSVT (nonsustained ventricular tachycardia) (HCC) 09/09/2023   Family history of heart disease 09/09/2023   OSA (obstructive sleep apnea) 09/09/2023   Cystocele with prolapse 05/30/2021   Pain in joint of left shoulder 04/13/2019   Aftercare 03/22/2018   History of total knee replacement, right 03/22/2018   Pain in right knee 03/16/2018   Primary osteoarthritis of right knee 02/26/2018   S/P knee replacement 02/26/2018   Osteoarthritis 02/17/2018   S/P Nissen fundoplication (without gastrostomy tube) procedure  06/29/2015   Upper airway cough syndrome 03/14/2015   Dyspnea 03/13/2015   Chronic venous insufficiency 07/21/2014   Lymphedema of arm 08/11/2011    PCP: Cleotilde Planas, MD  REFERRING PROVIDER: Dasie Leonor CROME, MD  REFERRING DIAG: R10.31 (ICD-10-CM) - Right lower quadrant pain  THERAPY DIAG:  Cramp and spasm  Abnormal posture  Rationale for Evaluation and Treatment: Rehabilitation  ONSET DATE: 12/2023  SUBJECTIVE:                                                                                                                                                                                           SUBJECTIVE STATEMENT: Patient reports that on March 3 she had a colonoscopy and they perforated her colon, she spent 5 nights in the hospital, Has had right quadrant pain where she had the drainage tube and when she stands for 15-20 mins, the pain gets worse. She has to pull the skin in and puts pressure on. When she stands for an hour to get her hair and makeup done, she has to use a compression abdominal support with velcro.  They tell her it's muscular She feels tired, feels low on iron.  Does not sleep well- has to get up to go bathroom to urinate, does not have bowel control.  Had e coli after her hospital stay, awaiting pelvic PT at [facility]    Fluid intake:   PAIN:  Are you having pain? Yes NPRS scale: 10/10 Pain location: right lower abdominal   Pain type: sharp Pain description: intermittent when she stands   Aggravating factors: standing Relieving factors: sitting or tylenol   PRECAUTIONS: None  RED FLAGS: None   WEIGHT BEARING RESTRICTIONS: No  FALLS:  Has patient fallen in last 6 months? No  OCCUPATION: retired  ACTIVITY LEVEL : not active now, was going to to gym and do 4 classes/ week  PLOF: Independent  PATIENT GOALS: to be able to  stand and go to the gym without pain, be more active again  PERTINENT HISTORY:  Colonoscopy repair Sexual abuse:  No  BOWEL MOVEMENT: reports incontinence URINATION: reports leaking  INTERCOURSE:not active  PREGNANCY: Vaginal deliveries 1 PROLAPSE: none    OBJECTIVE:  Note: Objective measures were completed at Evaluation unless otherwise noted.      COGNITION: Overall cognitive status: Within functional limits for tasks assessed     SENSATION: Light touch: Appears intact  LUMBAR SPECIAL TESTS:  Straight leg raise test: Positive for compensation throughout abdomen  GAIT: Assistive device utilized: None Comments: antalgic  POSTURE: scoliosis   LUMBARAROM/PROM:   A/PROM A/PROM  eval  Flexion 75%  Extension 75%  Right lateral flexion 50%  Left lateral flexion 50%  Right rotation 50%  Left rotation 50%   (Blank rows = not tested)  LOWER EXTREMITY ROM: full   LOWER EXTREMITY MMT:  MMT Right eval Left eval  Hip flexion 4- 4-  Hip extension    Hip abduction    Hip adduction    Hip internal rotation    Hip external rotation    Knee flexion    Knee extension    Ankle dorsiflexion    Ankle plantarflexion    Ankle inversion    Ankle eversion     (Blank rows = not tested) PALPATION:   General: tight and tender abdominal scars  Pelvic Alignment: uneven               Patient confirms identification and approves PT to assess internal pelvic floor and treatment No- patient reports that she will be seen at [facility] for pelvic  PELVIC MMT:   MMT eval  Vaginal   Internal Anal Sphincter   External Anal Sphincter   Puborectalis   Diastasis Recti 2 fingers with doming  (Blank rows = not tested)          TODAY'S TREATMENT:                                                                                                                              DATE: 04/30/2024   EVAL Examination completed, findings reviewed, pt educated on POC, HEP, and female pelvic floor anatomy Pt motivated to participate in PT and agreeable to attempt recommendations.      PATIENT EDUCATION:  Education details: Pt was educated on relevant anatomy, exam findings, HEP, expectations of PT   Person educated: Patient Education method: Explanation, Demonstration, Tactile cues, Verbal cues, and Handouts Education comprehension: verbalized understanding, returned demonstration, verbal cues required, tactile cues required, and needs further education  HOME EXERCISE PROGRAM:  [id number]  ASSESSMENT:  CLINICAL IMPRESSION: Patient is a 73 y.o. F who was seen today for physical therapy evaluation and treatment for abdominal pain. Exam findings are notable for upper chest breathing strategies, large tight and tender abdominal scars, abdominal diastasis rectus with doming. Patient demonstrates trunk scoliosis, tightness and weakness, bilateral hip weakness, decreased strength in  hips and kness, tightness in abdominal scar and respiratory diaphragm, reduced range or motion in respiratory diaphragm and pain in scars and abdominal muscles. It is difficult for patient to transfers due to abdominal pain and weakness . Discussed findings with patient, recommended cupping and scar massage today, patient was educated on scar treatment in PT and HEP was initiated. Patient's quality of life has been affected, patient will benefit from physical therapy to address deficits, reduce abdominal pain and improve quality of life .   OBJECTIVE IMPAIRMENTS: decreased knowledge of condition, decreased mobility, difficulty walking, decreased ROM, decreased strength, dizziness, increased fascial restrictions, impaired tone, improper body mechanics, postural dysfunction, obesity, and pain.   ACTIVITY LIMITATIONS: lifting, bending, standing, transfers, continence, and toileting  PARTICIPATION LIMITATIONS: community activity and exercise  PERSONAL FACTORS: Past/current experiences and Time since onset of injury/illness/exacerbation are also affecting patient's functional outcome.   REHAB POTENTIAL:  Good  CLINICAL DECISION MAKING: Evolving/moderate complexity  EVALUATION COMPLEXITY: Moderate   GOALS: Goals reviewed with patient? Yes  SHORT TERM GOALS: Target date: 05/27/2024    Patient will be I with scar cupping and massage Baseline: Goal status: INITIAL  2. Pt will be independent and compliant with HEP and perform all exercises correctly   Baseline:  Goal status: INITIAL  3.  Patient will be able to stand for at least 30 minutes without increased abdominal pain Baseline:  Goal status: INITIAL  4.   Pt will demonstrate appropriate lateral rib cage excursion with inhale to ensure better abdominal pressure management and pelvic floor/abdominal muscle relaxation.   Baseline:  Goal status: INITIAL   LONG TERM GOALS: Target date: 07/29/2024  Pt will have reduced abdominal  pain to max 1/10 in order to be able to do functional activities such as bending, lifting, twisting and walking as needed to be able to take care of herself and participate in community activities.  Baseline:  Goal status: INITIAL  2.  Patient will be able to stand and walk at least 1 hour without increased abdominal pain Baseline:  Goal status: INITIAL  3.  Patient will demonstrate at least 1/2 curl up without increased abdominal pain and doming in her DRA with good pressure management Baseline:  Goal status: INITIAL  4.  Patient will return to the gym at least 3 times/ weak and participate in at least 45 min classes without increased abdominal pain Baseline:  Goal status: INITIAL    PLAN:  PT FREQUENCY: 1-2x/week  PT DURATION: 12 weeks  PLANNED INTERVENTIONS: 97110-Therapeutic exercises, 97530- Therapeutic activity, 97112- Neuromuscular re-education, 97535- Self Care, [id number]- Manual therapy, [phone]- Electrical stimulation (manual), [phone] (1-2 muscles), [phone] (3+ muscles)- Dry Needling, Patient/Family education, Taping, Joint mobilization, Joint manipulation, Spinal manipulation, Spinal  mobilization, Manual lymph drainage, Scar mobilization, Cryotherapy, Moist heat, and Biofeedback  PLAN FOR NEXT SESSION: continue manual for abdomen and exercises   Kassadie Pancake, PT 04/29/2024, 12:22 PM   PHYSICAL THERAPY DISCHARGE SUMMARY  Visits from Start of Care: 1   Patient agrees to discharge. Patient goals were not met. Patient is being discharged due to the patient's request.  05/02/2024 Cori Florida, PT, DPT  [facility] [address], [address] [place], [place] [address] Phone # [phone] Fax [phone]

## 2024-05-18 DIAGNOSIS — N939 Abnormal uterine and vaginal bleeding, unspecified: Secondary | ICD-10-CM | POA: Diagnosis not present

## 2024-05-18 DIAGNOSIS — Z6829 Body mass index (BMI) 29.0-29.9, adult: Secondary | ICD-10-CM | POA: Diagnosis not present

## 2024-05-19 DIAGNOSIS — M818 Other osteoporosis without current pathological fracture: Secondary | ICD-10-CM | POA: Diagnosis not present

## 2024-05-20 DIAGNOSIS — T83711A Erosion of implanted vaginal mesh and other prosthetic materials to surrounding organ or tissue, initial encounter: Secondary | ICD-10-CM | POA: Diagnosis not present

## 2024-05-24 DIAGNOSIS — N8110 Cystocele, unspecified: Secondary | ICD-10-CM | POA: Diagnosis not present

## 2024-05-26 DIAGNOSIS — R1030 Lower abdominal pain, unspecified: Secondary | ICD-10-CM | POA: Diagnosis not present

## 2024-05-26 DIAGNOSIS — M62838 Other muscle spasm: Secondary | ICD-10-CM | POA: Diagnosis not present

## 2024-05-26 DIAGNOSIS — R151 Fecal smearing: Secondary | ICD-10-CM | POA: Diagnosis not present

## 2024-05-26 DIAGNOSIS — N3941 Urge incontinence: Secondary | ICD-10-CM | POA: Diagnosis not present

## 2024-05-26 DIAGNOSIS — M6281 Muscle weakness (generalized): Secondary | ICD-10-CM | POA: Diagnosis not present

## 2024-06-03 ENCOUNTER — Other Ambulatory Visit: Payer: Self-pay | Admitting: Internal Medicine

## 2024-06-15 DIAGNOSIS — M8589 Other specified disorders of bone density and structure, multiple sites: Secondary | ICD-10-CM | POA: Diagnosis not present

## 2024-06-15 DIAGNOSIS — M810 Age-related osteoporosis without current pathological fracture: Secondary | ICD-10-CM | POA: Diagnosis not present

## 2024-06-20 ENCOUNTER — Ambulatory Visit: Admitting: Physical Therapy

## 2024-06-28 DIAGNOSIS — R102 Pelvic and perineal pain: Secondary | ICD-10-CM | POA: Diagnosis not present

## 2024-06-28 DIAGNOSIS — N3941 Urge incontinence: Secondary | ICD-10-CM | POA: Diagnosis not present

## 2024-06-29 ENCOUNTER — Other Ambulatory Visit: Payer: Self-pay | Admitting: Urology

## 2024-06-29 DIAGNOSIS — R102 Pelvic and perineal pain: Secondary | ICD-10-CM

## 2024-06-29 DIAGNOSIS — N3941 Urge incontinence: Secondary | ICD-10-CM

## 2024-07-01 ENCOUNTER — Encounter: Admitting: Physical Therapy

## 2024-07-13 ENCOUNTER — Ambulatory Visit
Admission: RE | Admit: 2024-07-13 | Discharge: 2024-07-13 | Disposition: A | Source: Ambulatory Visit | Attending: Urology

## 2024-07-13 DIAGNOSIS — K5730 Diverticulosis of large intestine without perforation or abscess without bleeding: Secondary | ICD-10-CM | POA: Diagnosis not present

## 2024-07-13 DIAGNOSIS — R102 Pelvic and perineal pain unspecified side: Secondary | ICD-10-CM

## 2024-07-13 DIAGNOSIS — N3941 Urge incontinence: Secondary | ICD-10-CM

## 2024-07-13 DIAGNOSIS — N281 Cyst of kidney, acquired: Secondary | ICD-10-CM | POA: Diagnosis not present

## 2024-07-13 MED ORDER — GADOPICLENOL 0.5 MMOL/ML IV SOLN
6.0000 mL | Freq: Once | INTRAVENOUS | Status: AC | PRN
Start: 2024-07-13 — End: 2024-07-13
  Administered 2024-07-13: 6 mL via INTRAVENOUS

## 2024-07-18 ENCOUNTER — Encounter (HOSPITAL_COMMUNITY): Payer: Self-pay

## 2024-07-18 ENCOUNTER — Other Ambulatory Visit: Payer: Self-pay

## 2024-07-18 ENCOUNTER — Inpatient Hospital Stay (HOSPITAL_COMMUNITY)
Admission: EM | Admit: 2024-07-18 | Discharge: 2024-07-23 | DRG: 391 | Disposition: A | Discharge status: Home / Self Care | Attending: Internal Medicine | Admitting: Internal Medicine

## 2024-07-18 ENCOUNTER — Emergency Department (HOSPITAL_COMMUNITY)

## 2024-07-18 DIAGNOSIS — N3289 Other specified disorders of bladder: Secondary | ICD-10-CM | POA: Diagnosis not present

## 2024-07-18 DIAGNOSIS — L02211 Cutaneous abscess of abdominal wall: Secondary | ICD-10-CM | POA: Diagnosis not present

## 2024-07-18 DIAGNOSIS — I493 Ventricular premature depolarization: Secondary | ICD-10-CM | POA: Diagnosis not present

## 2024-07-18 DIAGNOSIS — Z6831 Body mass index (BMI) 31.0-31.9, adult: Secondary | ICD-10-CM

## 2024-07-18 DIAGNOSIS — Z83438 Family history of other disorder of lipoprotein metabolism and other lipidemia: Secondary | ICD-10-CM

## 2024-07-18 DIAGNOSIS — K5730 Diverticulosis of large intestine without perforation or abscess without bleeding: Secondary | ICD-10-CM | POA: Diagnosis not present

## 2024-07-18 DIAGNOSIS — I739 Peripheral vascular disease, unspecified: Secondary | ICD-10-CM | POA: Diagnosis not present

## 2024-07-18 DIAGNOSIS — Z87440 Personal history of urinary (tract) infections: Secondary | ICD-10-CM | POA: Diagnosis not present

## 2024-07-18 DIAGNOSIS — Z96651 Presence of right artificial knee joint: Secondary | ICD-10-CM | POA: Diagnosis present

## 2024-07-18 DIAGNOSIS — Z8261 Family history of arthritis: Secondary | ICD-10-CM | POA: Diagnosis not present

## 2024-07-18 DIAGNOSIS — K635 Polyp of colon: Secondary | ICD-10-CM | POA: Diagnosis not present

## 2024-07-18 DIAGNOSIS — N281 Cyst of kidney, acquired: Secondary | ICD-10-CM | POA: Diagnosis not present

## 2024-07-18 DIAGNOSIS — I2510 Atherosclerotic heart disease of native coronary artery without angina pectoris: Secondary | ICD-10-CM | POA: Diagnosis present

## 2024-07-18 DIAGNOSIS — Z9049 Acquired absence of other specified parts of digestive tract: Secondary | ICD-10-CM

## 2024-07-18 DIAGNOSIS — K5720 Diverticulitis of large intestine with perforation and abscess without bleeding: Secondary | ICD-10-CM | POA: Diagnosis not present

## 2024-07-18 DIAGNOSIS — R8281 Pyuria: Secondary | ICD-10-CM | POA: Diagnosis present

## 2024-07-18 DIAGNOSIS — M810 Age-related osteoporosis without current pathological fracture: Secondary | ICD-10-CM | POA: Diagnosis not present

## 2024-07-18 DIAGNOSIS — R1031 Right lower quadrant pain: Secondary | ICD-10-CM | POA: Diagnosis not present

## 2024-07-18 DIAGNOSIS — Z888 Allergy status to other drugs, medicaments and biological substances status: Secondary | ICD-10-CM

## 2024-07-18 DIAGNOSIS — E785 Hyperlipidemia, unspecified: Secondary | ICD-10-CM | POA: Diagnosis present

## 2024-07-18 DIAGNOSIS — Z79899 Other long term (current) drug therapy: Secondary | ICD-10-CM

## 2024-07-18 DIAGNOSIS — K651 Peritoneal abscess: Principal | ICD-10-CM | POA: Diagnosis present

## 2024-07-18 DIAGNOSIS — R188 Other ascites: Secondary | ICD-10-CM | POA: Diagnosis not present

## 2024-07-18 DIAGNOSIS — K219 Gastro-esophageal reflux disease without esophagitis: Secondary | ICD-10-CM | POA: Diagnosis present

## 2024-07-18 DIAGNOSIS — K449 Diaphragmatic hernia without obstruction or gangrene: Secondary | ICD-10-CM | POA: Diagnosis not present

## 2024-07-18 DIAGNOSIS — Z8249 Family history of ischemic heart disease and other diseases of the circulatory system: Secondary | ICD-10-CM

## 2024-07-18 DIAGNOSIS — R9389 Abnormal findings on diagnostic imaging of other specified body structures: Secondary | ICD-10-CM | POA: Diagnosis not present

## 2024-07-18 DIAGNOSIS — R32 Unspecified urinary incontinence: Secondary | ICD-10-CM | POA: Diagnosis not present

## 2024-07-18 DIAGNOSIS — E669 Obesity, unspecified: Secondary | ICD-10-CM | POA: Diagnosis present

## 2024-07-18 DIAGNOSIS — R109 Unspecified abdominal pain: Principal | ICD-10-CM | POA: Diagnosis present

## 2024-07-18 DIAGNOSIS — Z87891 Personal history of nicotine dependence: Secondary | ICD-10-CM

## 2024-07-18 DIAGNOSIS — Z885 Allergy status to narcotic agent status: Secondary | ICD-10-CM | POA: Diagnosis not present

## 2024-07-18 DIAGNOSIS — G43909 Migraine, unspecified, not intractable, without status migrainosus: Secondary | ICD-10-CM | POA: Diagnosis present

## 2024-07-18 DIAGNOSIS — N289 Disorder of kidney and ureter, unspecified: Secondary | ICD-10-CM | POA: Diagnosis not present

## 2024-07-18 DIAGNOSIS — N939 Abnormal uterine and vaginal bleeding, unspecified: Secondary | ICD-10-CM | POA: Diagnosis present

## 2024-07-18 LAB — COMPREHENSIVE METABOLIC PANEL WITH GFR
ALT: 16 U/L (ref 0–44)
AST: 18 U/L (ref 15–41)
Albumin: 3.2 g/dL — ABNORMAL LOW (ref 3.5–5.0)
Alkaline Phosphatase: 76 U/L (ref 38–126)
Anion gap: 9 (ref 5–15)
BUN: 17 mg/dL (ref 8–23)
CO2: 23 mmol/L (ref 22–32)
Calcium: 8.7 mg/dL — ABNORMAL LOW (ref 8.9–10.3)
Chloride: 107 mmol/L (ref 98–111)
Creatinine, Ser: 0.61 mg/dL (ref 0.44–1.00)
GFR, Estimated: >60 mL/min (ref >60)
Glucose, Bld: 107 mg/dL — ABNORMAL HIGH (ref 70–99)
Potassium: 3.8 mmol/L (ref 3.5–5.1)
Sodium: 139 mmol/L (ref 135–145)
Total Bilirubin: 0.5 mg/dL (ref 0.0–1.2)
Total Protein: 6.1 g/dL — ABNORMAL LOW (ref 6.5–8.1)

## 2024-07-18 LAB — CBC
HCT: 36.2 % (ref 36.0–46.0)
Hemoglobin: 11.6 g/dL — ABNORMAL LOW (ref 12.0–15.0)
MCH: 26.5 pg (ref 26.0–34.0)
MCHC: 32 g/dL (ref 30.0–36.0)
MCV: 82.8 fL (ref 80.0–100.0)
Platelets: 247 K/uL (ref 150–400)
RBC: 4.37 MIL/uL (ref 3.87–5.11)
RDW: 14.1 % (ref 11.5–15.5)
WBC: 7.6 K/uL (ref 4.0–10.5)
nRBC: 0 % (ref 0.0–0.2)

## 2024-07-18 LAB — URINALYSIS, ROUTINE W REFLEX MICROSCOPIC
Bilirubin Urine: NEGATIVE
Glucose, UA: NEGATIVE mg/dL
Ketones, ur: NEGATIVE mg/dL
Nitrite: NEGATIVE
Protein, ur: NEGATIVE mg/dL
Specific Gravity, Urine: 1.01 (ref 1.005–1.030)
WBC, UA: >50 WBC/hpf (ref 0–5)
pH: 6 (ref 5.0–8.0)

## 2024-07-18 LAB — LIPASE, BLOOD: Lipase: 38 U/L (ref 11–51)

## 2024-07-18 MED ORDER — SODIUM CHLORIDE 0.9% FLUSH: 3.0000 mL | INTRAVENOUS | Status: DC | PRN

## 2024-07-18 MED ORDER — ONDANSETRON HCL 4 MG/2ML IJ SOLN: 4.0000 mg | Freq: Four times a day (QID) | INTRAMUSCULAR | Status: DC | PRN

## 2024-07-18 MED ORDER — PIPERACILLIN-TAZOBACTAM 3.375 G IVPB 30 MIN
3.3750 g | Freq: Once | INTRAVENOUS | Status: AC
  Administered 2024-07-18: 3.375 g via INTRAVENOUS
  Filled 2024-07-18: qty 50

## 2024-07-18 MED ORDER — IOHEXOL 350 MG/ML SOLN
75.0000 mL | Freq: Once | INTRAVENOUS | Status: AC | PRN
  Administered 2024-07-18: 75 mL via INTRAVENOUS

## 2024-07-18 MED ORDER — ONDANSETRON HCL 4 MG PO TABS
4.0000 mg | ORAL_TABLET | Freq: Four times a day (QID) | ORAL | Status: DC | PRN
  Administered 2024-07-19: 4 mg via ORAL
  Filled 2024-07-18: qty 1

## 2024-07-18 MED ORDER — CALCIUM CARB-CHOLECALCIFEROL 600-20 MG-MCG PO TABS: 1.0000 | ORAL_TABLET | Freq: Every day | ORAL | Status: DC

## 2024-07-18 MED ORDER — SODIUM CHLORIDE 0.9 % IV SOLN: 250.0000 mL | INTRAVENOUS | Status: AC | PRN

## 2024-07-18 MED ORDER — OYSTER SHELL CALCIUM/D3 500-5 MG-MCG PO TABS
1.0000 | ORAL_TABLET | Freq: Every day | ORAL | Status: DC
  Administered 2024-07-19 – 2024-07-23 (×5): 1 via ORAL
  Filled 2024-07-18 (×5): qty 1

## 2024-07-18 MED ORDER — HYDRALAZINE HCL 20 MG/ML IJ SOLN: 5.0000 mg | Freq: Four times a day (QID) | INTRAMUSCULAR | Status: DC | PRN

## 2024-07-18 MED ORDER — ASPIRIN-ACETAMINOPHEN-CAFFEINE 250-250-65 MG PO TABS
1.0000 | ORAL_TABLET | ORAL | Status: DC | PRN
  Administered 2024-07-19: 1 via ORAL
  Filled 2024-07-18 (×2): qty 1

## 2024-07-18 MED ORDER — ACETAMINOPHEN 325 MG PO TABS
650.0000 mg | ORAL_TABLET | Freq: Four times a day (QID) | ORAL | Status: DC | PRN
  Administered 2024-07-19: 650 mg via ORAL
  Filled 2024-07-18: qty 2

## 2024-07-18 MED ORDER — ATORVASTATIN CALCIUM 10 MG PO TABS
10.0000 mg | ORAL_TABLET | Freq: Every morning | ORAL | Status: DC
  Administered 2024-07-19 – 2024-07-23 (×5): 10 mg via ORAL
  Filled 2024-07-18 (×5): qty 1

## 2024-07-18 MED ORDER — SODIUM CHLORIDE 0.9 % IV SOLN: INTRAVENOUS | Status: AC

## 2024-07-18 MED ORDER — ACETAMINOPHEN 650 MG RE SUPP: 650.0000 mg | Freq: Four times a day (QID) | RECTAL | Status: DC | PRN

## 2024-07-18 MED ORDER — PIPERACILLIN-TAZOBACTAM 3.375 G IVPB
3.3750 g | Freq: Three times a day (TID) | INTRAVENOUS | Status: DC
  Administered 2024-07-18 – 2024-07-23 (×13): 3.375 g via INTRAVENOUS
  Filled 2024-07-18 (×14): qty 50

## 2024-07-18 MED ORDER — METOPROLOL TARTRATE 25 MG PO TABS
25.0000 mg | ORAL_TABLET | Freq: Two times a day (BID) | ORAL | Status: DC
  Administered 2024-07-18 – 2024-07-23 (×10): 25 mg via ORAL
  Filled 2024-07-18 (×10): qty 1

## 2024-07-18 MED ORDER — METHOCARBAMOL 750 MG PO TABS: 750.0000 mg | ORAL_TABLET | Freq: Three times a day (TID) | ORAL | Status: DC | PRN

## 2024-07-18 MED ORDER — AMITRIPTYLINE HCL 10 MG PO TABS
10.0000 mg | ORAL_TABLET | Freq: Every day | ORAL | Status: DC
  Administered 2024-07-18 – 2024-07-22 (×5): 10 mg via ORAL
  Filled 2024-07-18 (×6): qty 1

## 2024-07-18 MED ORDER — OXYCODONE HCL 5 MG PO TABS
5.0000 mg | ORAL_TABLET | ORAL | Status: AC | PRN
Start: 2024-07-18 — End: ?

## 2024-07-18 MED ORDER — SODIUM CHLORIDE 0.9% FLUSH
3.0000 mL | Freq: Two times a day (BID) | INTRAVENOUS | Status: DC
  Administered 2024-07-19 – 2024-07-23 (×5): 3 mL via INTRAVENOUS

## 2024-07-18 NOTE — ED Triage Notes (Signed)
 Pt c/o RLQ pain x 7 months and vaginal bleeding. Pt states she had a MRI last week that showed a large collection of infected fluid that needs to be cleaned out and referred pt to ED for IV antibiotics.

## 2024-07-18 NOTE — H&P (Addendum)
 [facility] History and Physical  Meghan Avila FMW:[id number] DOB: [birth year] DOA: 07/18/2024  Referring physician: ED  PCP: Cleotilde Planas, MD   Patient is coming from: Home  Chief Complaint: Right lower abdominal pain  HPI:  Patient is a 73 years old female with past medical history of GERD, peripheral vascular disease, perforated colon in March status post exploratory laparotomy and colorrhaphy on 12/07/2023 presented to the hospital with abdominal pain.  Patient stated that she had been having some pain in the right lower quadrant underneath one of her postoperative drain site that was removed in the past.  Complains of  worse shooting pain when standing and better when lying down down in bed without movement.  Denies any fever, chills, nausea, vomiting, diarrhea or constipation.  Has had good appetite.  Denies any urinary urgency frequency or dysuria but has been having some brownish vaginal discharge with intermittent bleeding which has been going on for months now.  She had gone to her urologist who has been watching her on the symptoms.    In the ED, patient had stable vitals.  Labs were notable for no leukocytosis.  Creatinine of 0.6.  Urinalysis with more than 50 white cells.  CT scan of the abdomen and pelvis showed sigmoid diverticulitis with tract extending inferiorly along the posterior inferior bladder and small abscess posterior to the bladder with 11 mm lesion in the lateral interpolar right kidney.  From the ED, interventional radiology, General Surgery  were notified.  IR was consulted for possible aspiration of the collection and patient was therefore admitted to the hospital for further evaluation and treatment.   Assessment and Plan Principal Problem:   Abdominal pain  Right lower abdominal pain status post exploratory laparotomy and colectomy in the past for perforated colon.  CT scan of the abdomen now with collection in the pelvis.  ED provider had reached  out to general surgery and IR and plan is IR guided sampling/aspiration of the collection. Will put n.p.o. after midnight.  Surgery and IR to follow.  Patient received IV Zosyn  for possible abscess, will continue Zosyn ..  Vaginal bleeding.  Intermittent with brownish discharge at times.  Going on for several months now.  Has had follow-up with urology as outpatient with Dr. Cam, [facility].  Conservative treatment underway at this time.  Patient states that urology had possibility that it might be bleeding from the vaginal cuff area from previous site of surgery.  Pyuria.  Asymptomatic.  Denies any dysuria urgency frequency.  Patient has been started on Zosyn .    History of migraine headache.  Takes Excedrin  and amitriptyline  at home.  Resumed  Hyperlipidemia.  Will continue Lipitor  History of PVCs.  On metoprolol .  DVT Prophylaxis: SCD for now due to mild vaginal bleeding  Review of Systems:  All systems were reviewed and were negative unless otherwise mentioned in the HPI   Past Medical History:  Diagnosis Date   Arthritis    Family history of adverse reaction to anesthesia    post nausea and vomitting   GERD (gastroesophageal reflux disease)    hx of   H/O wheezing    occ.   Hand swelling    related to lymph nodes    Headache    migraines   History of hiatal hernia    Leg swelling    left leg -edema knee level to ankle.   Osteoporosis    Peripheral vascular disease    left leg  PONV (postoperative nausea and vomiting)    Scoliosis    Vertigo    Past Surgical History:  Procedure Laterality Date   ANAL RECTAL MANOMETRY N/A 08/03/2019   Procedure: ANO RECTAL MANOMETRY;  Surgeon: Debby Hila, MD;  Location: [facility];  Service: Endoscopy;  Laterality: N/A;   BACK SURGERY     scoliosis surgery 1970 and 1972   BREAST REDUCTION SURGERY  1997   CHOLECYSTECTOMY  1992   laparoscopic   COLONOSCOPY WITH PROPOFOL  N/A 04/19/2015   Procedure: COLONOSCOPY WITH  PROPOFOL ;  Surgeon: Gladis MARLA Louder, MD;  Location: [facility];  Service: Endoscopy;  Laterality: N/A;   DEEP AXILLARY SENTINEL NODE BIOPSY / EXCISION     x2   ESOPHAGEAL MANOMETRY N/A 05/04/2015   Procedure: ESOPHAGEAL MANOMETRY (EM);  Surgeon: Gladis MARLA Louder, MD;  Location: [facility];  Service: Endoscopy;  Laterality: N/A;   ESOPHAGOGASTRODUODENOSCOPY (EGD) WITH PROPOFOL  N/A 04/19/2015   Procedure: ESOPHAGOGASTRODUODENOSCOPY (EGD) WITH PROPOFOL ;  Surgeon: Gladis MARLA Louder, MD;  Location: [facility];  Service: Endoscopy;  Laterality: N/A;   HEMORROIDECTOMY  2002, 2005   hiatal hernia surgery      JOINT REPLACEMENT     total right knee Dr. gerome 02-26-18   LAPAROTOMY N/A 12/07/2023   Procedure: EXPLORATORY LAPAROTOMY;  Surgeon: Dasie Leonor CROME, MD;  Location: [facility];  Service: General;  Laterality: N/A;   LASER ABLATION  2008   left leg    left knee surgery - torn meniscus (left)     LIPOMA EXCISION N/A 09/21/2015   Procedure: EXCISION 6CM BACK LIPOMA;  Surgeon: Lynda Leos, MD;  Location: [facility];  Service: General;  Laterality: N/A;   MENISCUS REPAIR     right   REDUCTION MAMMAPLASTY Bilateral 1998   right shoulder rotator cuff surgery      ROBOTIC ASSISTED LAPAROSCOPIC SACROCOLPOPEXY N/A 05/30/2021   Procedure: XI ROBOTIC ASSISTED LAPAROSCOPIC SACROCOLPOPEXY;  Surgeon: Cam Morene ORN, MD;  Location: [facility];  Service: Urology;  Laterality: N/A;   scoliosis  1970, 1972   no retained hardware   TOTAL KNEE ARTHROPLASTY Right 02/26/2018   Procedure: RIGHT TOTAL KNEE ARTHROPLASTY;  Surgeon: gerome Charleston, MD;  Location: [facility];  Service: Orthopedics;  Laterality: Right;    Social History:  reports that she quit smoking about 45 years ago. Her smoking use included cigarettes. She started smoking about 50 years ago. She has a 5 pack-year smoking history. She has never used smokeless tobacco. She reports current alcohol use. She reports that she does not use  drugs.  Allergies  Allergen Reactions   Zantac [Ranitidine Hcl] Anaphylaxis and Swelling   Alendronate Diarrhea    Burping   Morphine  And Codeine Nausea And Vomiting    Morphine , Hydrocodone  Tolerates Dilaudid , Fentanyl  per Epic   Prednisone Diarrhea and Nausea And Vomiting   Tramadol Nausea And Vomiting    AMS    Family History  Problem Relation Age of Onset   Varicose Veins Mother    Rheum arthritis Father    Cancer Sister    Heart disease Sister        before age 14   Hypertension Sister    Varicose Veins Sister    Heart attack Sister    Rheum arthritis Sister    Allergies Sister    Hyperlipidemia Brother    Sleep apnea Brother    Breast cancer Neg Hx      Prior to Admission medications   Medication Sig Start Date End Date Taking?  Authorizing Provider  amitriptyline  (ELAVIL ) 10 MG tablet TAKE 1 TABLET BY MOUTH EVERYDAY AT BEDTIME 04/19/24   Lomax, Amy, NP  Ascorbic Acid (VITAMIN C PO) Take 2 tablets by mouth daily.    [provider]  aspirin -acetaminophen -caffeine  (EXCEDRIN  MIGRAINE) 250-250-65 MG tablet Take 1 tablet by mouth as needed for headache or migraine.    [provider]  atorvastatin  (LIPITOR) 10 MG tablet Take 10 mg by mouth in the morning.    [provider]  Calcium  Carb-Cholecalciferol 600-20 MG-MCG TABS Take 1 tablet by mouth daily at 12 noon.    [provider]  denosumab  (PROLIA ) 60 MG/ML SOSY injection Inject 60 mg into the skin every 6 (six) months.    [provider]  diclofenac sodium (VOLTAREN) 1 % GEL Apply 1 application  topically daily as needed (KNEE PAIN).    [provider]  methocarbamol  (ROBAXIN ) 750 MG tablet Take 1 tablet (750 mg total) by mouth every 8 (eight) hours as needed (use for muscle cramps/pain). 12/10/23   Ebbie Cough, MD  metoprolol  tartrate (LOPRESSOR ) 25 MG tablet TAKE 1 TABLET BY MOUTH TWICE A DAY 06/03/24   Mallipeddi, Vishnu P, MD  Multiple Vitamins-Minerals  (MULTIVITAMIN WITH MINERALS) tablet Take 1 tablet by mouth daily.    [provider]  oxyCODONE  (OXY IR/ROXICODONE ) 5 MG immediate release tablet Take 1 tablet (5 mg total) by mouth every 4 (four) hours as needed for moderate pain (pain score 4-6). 12/10/23   Ebbie Cough, MD    Physical Exam:  Vitals:   07/18/24 1000 07/18/24 1002 07/18/24 1615  BP:  (!) 140/67   Pulse:  67   Resp:  16   Temp:  98 F (36.7 C) 97.9 F (36.6 C)  TempSrc:  Oral Oral  SpO2:  99%   Weight: 68.9 kg    Height: 5' (1.524 m)     Wt Readings from Last 3 Encounters:  07/18/24 68.9 kg  02/11/24 64.4 kg  12/11/23 70.4 kg   Body mass index is 29.69 kg/m.  General:  Average built, not in obvious distress, Communicative HENT: Normocephalic, mild pallor noted oral mucosa is moist.  Chest:  Clear breath sounds.  . No crackles or wheezes.  CVS: S1 &S2 heard. No murmur.  Regular rate and rhythm. Abdomen: Soft  nondistended.  Bowel sounds are heard. No abdominal mass palpated, vaginal examination was not done by me.  Old scars noted from previous laparoscopy and drain. Extremities: No cyanosis, clubbing or edema.  Peripheral pulses are palpable. Psych: Alert, awake and oriented, normal mood CNS:  No cranial nerve deficits.  Power equal in all extremities.   Skin: Warm and dry.  No rashes noted.  Labs on Admission:   CBC: Recent Labs  Lab 07/18/24 1005  WBC 7.6  HGB 11.6*  HCT 36.2  MCV 82.8  PLT 247    Basic Metabolic Panel: Recent Labs  Lab 07/18/24 1005  NA 139  K 3.8  CL 107  CO2 23  GLUCOSE 107*  BUN 17  CREATININE 0.61  CALCIUM  8.7*    Liver Function Tests: Recent Labs  Lab 07/18/24 1005  AST 18  ALT 16  ALKPHOS 76  BILITOT 0.5  PROT 6.1*  ALBUMIN  3.2*   Recent Labs  Lab 07/18/24 1005  LIPASE 38   No results for input(s): AMMONIA in the last 168 hours.  Cardiac Enzymes: No results for input(s): CKTOTAL, CKMB, CKMBINDEX, TROPONINI in the last  168 hours.  BNP (last  3 results) No results for input(s): BNP in the last 8760 hours.  ProBNP (last 3 results) No results for input(s): PROBNP in the last 8760 hours.  CBG: No results for input(s): GLUCAP in the last 168 hours.  Lipase     Component Value Date/Time   LIPASE 38 07/18/2024 1005     Urinalysis    Component Value Date/Time   COLORURINE YELLOW 07/18/2024 1005   APPEARANCEUR HAZY (A) 07/18/2024 1005   LABSPEC 1.010 07/18/2024 1005   PHURINE 6.0 07/18/2024 1005   GLUCOSEU NEGATIVE 07/18/2024 1005   HGBUR MODERATE (A) 07/18/2024 1005   BILIRUBINUR NEGATIVE 07/18/2024 1005   KETONESUR NEGATIVE 07/18/2024 1005   PROTEINUR NEGATIVE 07/18/2024 1005   NITRITE NEGATIVE 07/18/2024 1005   LEUKOCYTESUR LARGE (A) 07/18/2024 1005     Drugs of Abuse  No results found for: LABOPIA, COCAINSCRNUR, LABBENZ, AMPHETMU, THCU, LABBARB    Radiological Exams on Admission: CT ABDOMEN PELVIS W CONTRAST Result Date: 07/18/2024 CLINICAL DATA:  Right lower quadrant pain, abnormal pelvic MRI. EXAM: CT ABDOMEN AND PELVIS WITH CONTRAST TECHNIQUE: Multidetector CT imaging of the abdomen and pelvis was performed using the standard protocol following bolus administration of intravenous contrast. RADIATION DOSE REDUCTION: This exam was performed according to the departmental dose-optimization program which includes automated exposure control, adjustment of the mA and/or kV according to patient size and/or use of iterative reconstruction technique. CONTRAST:  75mL OMNIPAQUE  IOHEXOL  350 MG/ML SOLN COMPARISON:  Pelvic MRI 07/13/2024 and CT abdomen pelvis 12/07/2023. FINDINGS: Lower chest: No acute findings. Heart is at the upper limits of normal in size. No pericardial or pleural effusion. Distal esophagus is grossly unremarkable. Hepatobiliary: Liver is unremarkable. Cholecystectomy with associated biliary ductal dilatation. Pancreas: Negative. Spleen: Negative. Adrenals/Urinary Tract:  Adrenal glands are unremarkable. 11 mm lesion in the lateral interpolar right kidney measures 74 Hounsfield units. 6.7 cm cyst off the lower pole right kidney. Subcentimeter low-attenuation lesion in the lower pole left kidney, too small to characterize. Kidneys are otherwise unremarkable. Ureters are decompressed. There may be posterior bladder wall thickening secondary to an adjacent inflammatory process to be discussed below. Stomach/Bowel: Tiny hiatal hernia. Stomach, small bowel, appendix and majority of the colon are unremarkable. Sigmoid diverticulosis with very mild adjacent haziness and stranding, best seen on coronal imaging. There is a tract that extends from the sigmoid colon, inferiorly to a thick-walled fluid collection along the right paramidline posterior bladder, measuring approximately 2.5 x 2.5 cm (coronal image 94). Associated thickening of the posterior bladder wall with possible extension along the ventral pelvic floor (2/60 7-71). Rectal anastomosis. Vascular/Lymphatic: Vascular structures are unremarkable. No pathologically enlarged lymph nodes. Reproductive: No adnexal Mass. Other: No free fluid. Mesenteries and peritoneum are otherwise unremarkable. Musculoskeletal: Degenerative changes in the spine. Levoconvex scoliosis. Unilateral pars defect at L5 with grade 2 anterolisthesis. IMPRESSION: 1. Residua of sigmoid diverticulitis with a tract extending inferiorly along the posteroinferior bladder and a small abscess posterior to the bladder. 2. 11 mm lesion in the lateral interpolar right kidney measures 74 Hounsfield units. Lesion is too small to properly characterize. Further evaluation with pre and post contrast MRI should be considered. Pre and post contrast CT could alternatively be performed, but would likely be of decreased accuracy given lesion size. Electronically Signed   By: Newell Eke M.D.   On: 07/18/2024 14:09    EKG: Personally reviewed by me which shows normal sinus  rhythm   Consultant: IR, general surgery  Code Status: Full code  Microbiology blood cultures  Antibiotics: Zosyn  IV  Family Communication:   Patients' condition and plan of care including tests being ordered have been discussed with the patient  who indicate understanding and agree with the plan.   Status is: Inpatient  Severity of Illness: The appropriate patient status for this patient is INPATIENT. Inpatient status is judged to be reasonable and necessary in order to provide the required intensity of service to ensure the patient's safety. The patient's presenting symptoms, physical exam findings, and initial radiographic and laboratory data in the context of their chronic comorbidities is felt to place them at high risk for further clinical deterioration. Furthermore, it is not anticipated that the patient will be medically stable for discharge from the hospital within 2 midnights of admission.   * I certify that at the point of admission it is my clinical judgment that the patient will require inpatient hospital care spanning beyond 2 midnights from the point of admission due to high intensity of service, high risk for further deterioration and high frequency of surveillance required.*  Signed, Vernal Alstrom, MD [facility] 07/18/2024

## 2024-07-18 NOTE — Consult Note (Signed)
 Meghan Avila [facility] 1951-09-26  [id number].    Requesting MD: Yolande Charleston Chief Complaint/Reason for Consult: pelvic/intra-abdominal fluid collection  HPI:  Ms. Spittler is a 73 yo female with a history of exploratory laparotomy and primary repair of a sigmoid colon perforation by Dr. Dasie following a colonoscopy on 3/3. She had a blake drain in the RLQ that was removed prior to hospital discharge on POD#4. She tells me that since the time of surgery she has had severe RLQ pains that occur with standing and are relieved by sitting and rubbing the area with her hands. She feels this pain acutely worsened 01/28/24 after she decided to do a workout class at the gym. She denies fever, chills, nausea, or vomiting. She reports regular BMs that are occasionally dark, which she attributes to iron supplementation. She does report pink and brown discharge on a pad in her underwear - she is unclear if this is coming from her vagina or her urethra. She reports recent evaluation by Dr. Cam her urologist where she had some bleeding from her vaginal cuff after an exam. She tells me that she had a sacrocolpopexy 3 years ago by Dr. Cam for urinary incontinence and POP.  Since her recent operation by Dr. Dasie 12/2023 she reports worsening urinary incontinence. She is unsure if she is passing air in her urine or from her vagina. She denies dysuria or hematuria but does report a history of recurrent UTI's due to E.Coli. she denies the use of blood thinners.  Other significant abdominal surgical history includes hysterectomy. She is a former smoker.   In reviewing her chart, she did have a CT virtual colonoscopy 03/02/24 that showed: 1. Long segment wall thickening, diverticulosis and pericolonic inflammatory haziness with resolved extraluminal air, seen on 12/07/2023. Findings may be due to scarring related to diverticulitis. The possibility of recurrent acute diverticulitis is not excluded. 2.  Otherwise, no colonic polyp, mass or stricture. 3. Circumflex coronary artery calcification. ROS: Review of Systems  All other systems reviewed and are negative.   Family History  Problem Relation Age of Onset   Varicose Veins Mother    Rheum arthritis Father    Cancer Sister    Heart disease Sister        before age 22   Hypertension Sister    Varicose Veins Sister    Heart attack Sister    Rheum arthritis Sister    Allergies Sister    Hyperlipidemia Brother    Sleep apnea Brother    Breast cancer Neg Hx     Past Medical History:  Diagnosis Date   Arthritis    Family history of adverse reaction to anesthesia    post nausea and vomitting   GERD (gastroesophageal reflux disease)    hx of   H/O wheezing    occ.   Hand swelling    related to lymph nodes    Headache    migraines   History of hiatal hernia    Leg swelling    left leg -edema knee level to ankle.   Osteoporosis    Peripheral vascular disease    left leg    PONV (postoperative nausea and vomiting)    Scoliosis    Vertigo     Past Surgical History:  Procedure Laterality Date   ANAL RECTAL MANOMETRY N/A 08/03/2019   Procedure: ANO RECTAL MANOMETRY;  Surgeon: Debby Hila, MD;  Location: [facility];  Service: Endoscopy;  Laterality: N/A;   BACK SURGERY  scoliosis surgery 1970 and 1972   BREAST REDUCTION SURGERY  1997   CHOLECYSTECTOMY  1992   laparoscopic   COLONOSCOPY WITH PROPOFOL  N/A 04/19/2015   Procedure: COLONOSCOPY WITH PROPOFOL ;  Surgeon: Gladis MARLA Louder, MD;  Location: [facility];  Service: Endoscopy;  Laterality: N/A;   DEEP AXILLARY SENTINEL NODE BIOPSY / EXCISION     x2   ESOPHAGEAL MANOMETRY N/A 05/04/2015   Procedure: ESOPHAGEAL MANOMETRY (EM);  Surgeon: Gladis MARLA Louder, MD;  Location: [facility];  Service: Endoscopy;  Laterality: N/A;   ESOPHAGOGASTRODUODENOSCOPY (EGD) WITH PROPOFOL  N/A 04/19/2015   Procedure: ESOPHAGOGASTRODUODENOSCOPY (EGD) WITH PROPOFOL ;  Surgeon:  Gladis MARLA Louder, MD;  Location: [facility];  Service: Endoscopy;  Laterality: N/A;   HEMORROIDECTOMY  2002, 2005   hiatal hernia surgery      JOINT REPLACEMENT     total right knee Dr. gerome 02-26-18   LAPAROTOMY N/A 12/07/2023   Procedure: EXPLORATORY LAPAROTOMY;  Surgeon: Dasie Leonor CROME, MD;  Location: [facility];  Service: General;  Laterality: N/A;   LASER ABLATION  2008   left leg    left knee surgery - torn meniscus (left)     LIPOMA EXCISION N/A 09/21/2015   Procedure: EXCISION 6CM BACK LIPOMA;  Surgeon: Lynda Leos, MD;  Location: [facility];  Service: General;  Laterality: N/A;   MENISCUS REPAIR     right   REDUCTION MAMMAPLASTY Bilateral 1998   right shoulder rotator cuff surgery      ROBOTIC ASSISTED LAPAROSCOPIC SACROCOLPOPEXY N/A 05/30/2021   Procedure: XI ROBOTIC ASSISTED LAPAROSCOPIC SACROCOLPOPEXY;  Surgeon: Cam Morene ORN, MD;  Location: [facility];  Service: Urology;  Laterality: N/A;   scoliosis  1970, 1972   no retained hardware   TOTAL KNEE ARTHROPLASTY Right 02/26/2018   Procedure: RIGHT TOTAL KNEE ARTHROPLASTY;  Surgeon: gerome Charleston, MD;  Location: [facility];  Service: Orthopedics;  Laterality: Right;    Social History:  reports that she quit smoking about 45 years ago. Her smoking use included cigarettes. She started smoking about 50 years ago. She has a 5 pack-year smoking history. She has never used smokeless tobacco. She reports current alcohol use. She reports that she does not use drugs.  Allergies:  Allergies  Allergen Reactions   Zantac [Ranitidine Hcl] Anaphylaxis and Swelling   Alendronate Diarrhea    Burping   Morphine  And Codeine Nausea And Vomiting    Morphine , Hydrocodone  Tolerates Dilaudid , Fentanyl  per Epic   Prednisone Diarrhea and Nausea And Vomiting   Tramadol Nausea And Vomiting    AMS    (Not in a hospital admission)    Physical Exam: Blood pressure (!) 140/67, pulse 67, temperature 98 F (36.7 C), temperature source Oral, resp.  rate 16, height 5' (1.524 m), weight 68.9 kg, SpO2 99%. General: Pleasant white female  laying on hospital bed, appears stated age, NAD. HEENT: head -normocephalic, atraumatic; Eyes: PERRLA, no conjunctival injection Neck- Trachea is midline CV- RRR, normal S1/S2, no M/R/G, no lower extremity edema Pulm- breathing is non-labored. CTABL, no wheezes, rhales, rhonchi. Abd- soft, NT/ND, midline laparotomy scar and RLQ drain site scar appear well-healing without cellulitis or fluctuance. No hernias or masses. GU- deferred  MSK- UE/LE symmetrical, no cyanosis, clubbing, or edema. Neuro- CN II-XII grossly in tact, no paresthesias. Psych- Alert and Oriented x3 with appropriate affect Skin: warm and dry, no rashes or lesions   Results for orders placed or performed during the hospital encounter of 07/18/24 (from the past 48 hours)  Lipase, blood  Status: None   Collection Time: 07/18/24 10:05 AM  Result Value Ref Range   Lipase 38 11 - 51 U/L    Comment: Performed at [facility], [address] [place], [place] [address]  Comprehensive metabolic panel     Status: Abnormal   Collection Time: 07/18/24 10:05 AM  Result Value Ref Range   Sodium 139 135 - 145 mmol/L   Potassium 3.8 3.5 - 5.1 mmol/L   Chloride 107 98 - 111 mmol/L   CO2 23 22 - 32 mmol/L   Glucose, Bld 107 (H) 70 - 99 mg/dL    Comment: Glucose reference range applies only to samples taken after fasting for at least 8 hours.   BUN 17 8 - 23 mg/dL   Creatinine, Ser 9.38 0.44 - 1.00 mg/dL   Calcium  8.7 (L) 8.9 - 10.3 mg/dL   Total Protein 6.1 (L) 6.5 - 8.1 g/dL   Albumin  3.2 (L) 3.5 - 5.0 g/dL   AST 18 15 - 41 U/L   ALT 16 0 - 44 U/L   Alkaline Phosphatase 76 38 - 126 U/L   Total Bilirubin 0.5 0.0 - 1.2 mg/dL   GFR, Estimated >39 >39 mL/min    Comment: (NOTE) Calculated using the CKD-EPI Creatinine Equation (2021)    Anion gap 9 5 - 15    Comment: Performed at [facility], [address] [place], [place]  [address]  CBC     Status: Abnormal   Collection Time: 07/18/24 10:05 AM  Result Value Ref Range   WBC 7.6 4.0 - 10.5 K/uL   RBC 4.37 3.87 - 5.11 MIL/uL   Hemoglobin 11.6 (L) 12.0 - 15.0 g/dL   HCT 63.7 63.9 - 53.9 %   MCV 82.8 80.0 - 100.0 fL   MCH 26.5 26.0 - 34.0 pg   MCHC 32.0 30.0 - 36.0 g/dL   RDW 85.8 88.4 - 84.4 %   Platelets 247 150 - 400 K/uL   nRBC 0.0 0.0 - 0.2 %    Comment: Performed at [facility], [address], [place], [place] [address]  Urinalysis, Routine w reflex microscopic -Urine, Clean Catch     Status: Abnormal   Collection Time: 07/18/24 10:05 AM  Result Value Ref Range   Color, Urine YELLOW YELLOW   APPearance HAZY (A) CLEAR   Specific Gravity, Urine 1.010 1.005 - 1.030   pH 6.0 5.0 - 8.0   Glucose, UA NEGATIVE NEGATIVE mg/dL   Hgb urine dipstick MODERATE (A) NEGATIVE   Bilirubin Urine NEGATIVE NEGATIVE   Ketones, ur NEGATIVE NEGATIVE mg/dL   Protein, ur NEGATIVE NEGATIVE mg/dL   Nitrite NEGATIVE NEGATIVE   Leukocytes,Ua LARGE (A) NEGATIVE   RBC / HPF 11-20 0 - 5 RBC/hpf   WBC, UA >50 0 - 5 WBC/hpf   Bacteria, UA RARE (A) NONE SEEN   Squamous Epithelial / HPF 0-5 0 - 5 /HPF   WBC Clumps PRESENT    Mucus PRESENT     Comment: Performed at [facility], [address], [place], [place] [address]   CT ABDOMEN PELVIS W CONTRAST Result Date: 07/18/2024 CLINICAL DATA:  Right lower quadrant pain, abnormal pelvic MRI. EXAM: CT ABDOMEN AND PELVIS WITH CONTRAST TECHNIQUE: Multidetector CT imaging of the abdomen and pelvis was performed using the standard protocol following bolus administration of intravenous contrast. RADIATION DOSE REDUCTION: This exam was performed according to the departmental dose-optimization program which includes automated exposure control, adjustment of  the mA and/or kV according to patient size and/or use of iterative reconstruction technique. CONTRAST:  75mL OMNIPAQUE  IOHEXOL  350 MG/ML SOLN COMPARISON:  Pelvic MRI 07/13/2024  and CT abdomen pelvis 12/07/2023. FINDINGS: Lower chest: No acute findings. Heart is at the upper limits of normal in size. No pericardial or pleural effusion. Distal esophagus is grossly unremarkable. Hepatobiliary: Liver is unremarkable. Cholecystectomy with associated biliary ductal dilatation. Pancreas: Negative. Spleen: Negative. Adrenals/Urinary Tract: Adrenal glands are unremarkable. 11 mm lesion in the lateral interpolar right kidney measures 74 Hounsfield units. 6.7 cm cyst off the lower pole right kidney. Subcentimeter low-attenuation lesion in the lower pole left kidney, too small to characterize. Kidneys are otherwise unremarkable. Ureters are decompressed. There may be posterior bladder wall thickening secondary to an adjacent inflammatory process to be discussed below. Stomach/Bowel: Tiny hiatal hernia. Stomach, small bowel, appendix and majority of the colon are unremarkable. Sigmoid diverticulosis with very mild adjacent haziness and stranding, best seen on coronal imaging. There is a tract that extends from the sigmoid colon, inferiorly to a thick-walled fluid collection along the right paramidline posterior bladder, measuring approximately 2.5 x 2.5 cm (coronal image 94). Associated thickening of the posterior bladder wall with possible extension along the ventral pelvic floor (2/60 7-71). Rectal anastomosis. Vascular/Lymphatic: Vascular structures are unremarkable. No pathologically enlarged lymph nodes. Reproductive: No adnexal Mass. Other: No free fluid. Mesenteries and peritoneum are otherwise unremarkable. Musculoskeletal: Degenerative changes in the spine. Levoconvex scoliosis. Unilateral pars defect at L5 with grade 2 anterolisthesis. IMPRESSION: 1. Residua of sigmoid diverticulitis with a tract extending inferiorly along the posteroinferior bladder and a small abscess posterior to the bladder. 2. 11 mm lesion in the lateral interpolar right kidney measures 74 Hounsfield units. Lesion is too  small to properly characterize. Further evaluation with pre and post contrast MRI should be considered. Pre and post contrast CT could alternatively be performed, but would likely be of decreased accuracy given lesion size. Electronically Signed   By: Newell Eke M.D.   On: 07/18/2024 14:09      Assessment/Plan Pelvic fluid collection 73 y/o F with history of laparotomy and primary repair of sigmoid colon perforation 12/07/2023 who presents with persistent RLQ pain and vaginal vs urethral discharge. CT scan shows a small 2.5 cm fluid collection that appears to track from the sigmoid colon. This collection is sitting against the posterior bladder. The patient is hemodynamically stable without any role for acute surgical intervention, Recommend admission for antibiotics and IR consultation for aspiration vs drain placement. The collection is small so they may not be able to place a drain. If they can, I would ask them to perform an injection study to evaluate for fistula from the abscess cavity to the sigmoid colon. Alternatively we could get a repeat CT with rectal contrast to further assess.   FEN - ok for a diet if no IR procedures planned. VTE - SCD's,  ID - Zosyn  Admit - TR[facility] service  PVD GERD Colon polyps followed by [facility]  I reviewed nursing notes, ED provider notes, last 24 h vitals and pain scores, last 48 h intake and output, last 24 h labs and trends, and last 24 h imaging results.  Almarie GORMAN Pringle, PA-C [facility] 07/18/2024, 4:06 PM Please see Amion for pager number during day hours 7:00am-4:30pm or 7:00am -11:30am on weekends

## 2024-07-18 NOTE — ED Provider Notes (Signed)
 [place] EMERGENCY DEPARTMENT AT [facility] Provider Note   CSN: [id number] Arrival date & time: 07/18/24  9045     Patient presents with: Abdominal Pain   Meghan Avila is a 73 y.o. female.  {Add pertinent medical, surgical, social history, OB history to HPI:339} 73 year old female with history of perforated colon who presents emergency department for abdominal pain.  Patient reports that in March she had a colonoscopy that was complicated by perforation.  She underwent ex lap and colorrhaphy on 12/07/2023.  Since then she has been having pain in her right lower quadrant just underneath where one of the postoperative drains is.  Says it is worse when she stands up.  Sharp shooting pain.  Improves when she lies down.  Currently asymptomatic.  No fevers or chills or nausea or vomiting.  No diarrhea or constipation.  Says that since then she has been having some pink vaginal discharge and believes that she had some bleeding from her vaginal cuff based on appointment from urologist.  Also reports some brown discharge from her vagina.  On 07/13/2024 had an MRI of the pelvis that showed a 3 cm lesion in the right side of the pelvis and came to the emergency department for additional evaluation.       Prior to Admission medications   Medication Sig Start Date End Date Taking? Authorizing Provider  amitriptyline  (ELAVIL ) 10 MG tablet TAKE 1 TABLET BY MOUTH EVERYDAY AT BEDTIME 04/19/24   Lomax, Amy, NP  Ascorbic Acid (VITAMIN C PO) Take 2 tablets by mouth daily.    [provider]  aspirin -acetaminophen -caffeine  (EXCEDRIN  MIGRAINE) 250-250-65 MG tablet Take 1 tablet by mouth as needed for headache or migraine.    [provider]  atorvastatin  (LIPITOR) 10 MG tablet Take 10 mg by mouth in the morning.    [provider]  Calcium  Carb-Cholecalciferol 600-20 MG-MCG TABS Take 1 tablet by mouth daily at 12 noon.    [provider]  denosumab   (PROLIA ) 60 MG/ML SOSY injection Inject 60 mg into the skin every 6 (six) months.    [provider]  diclofenac sodium (VOLTAREN) 1 % GEL Apply 1 application  topically daily as needed (KNEE PAIN).    [provider]  methocarbamol  (ROBAXIN ) 750 MG tablet Take 1 tablet (750 mg total) by mouth every 8 (eight) hours as needed (use for muscle cramps/pain). 12/10/23   Ebbie Cough, MD  metoprolol  tartrate (LOPRESSOR ) 25 MG tablet TAKE 1 TABLET BY MOUTH TWICE A DAY 06/03/24   Mallipeddi, Vishnu P, MD  Multiple Vitamins-Minerals (MULTIVITAMIN WITH MINERALS) tablet Take 1 tablet by mouth daily.    [provider]  oxyCODONE  (OXY IR/ROXICODONE ) 5 MG immediate release tablet Take 1 tablet (5 mg total) by mouth every 4 (four) hours as needed for moderate pain (pain score 4-6). 12/10/23   Ebbie Cough, MD    Allergies: Zantac [ranitidine hcl], Alendronate, Morphine  and codeine, Prednisone, and Tramadol    Review of Systems  Updated Vital Signs BP (!) 140/67 (BP Location: Left Arm)   Pulse 67   Temp 98 F (36.7 C) (Oral)   Resp 16   Ht 5' (1.524 m)   Wt 68.9 kg   SpO2 99%   BMI 29.69 kg/m   Physical Exam Constitutional:      General: She is not in acute distress.    Appearance: Normal appearance. She is well-developed.  Abdominal:     General: There is no distension.  Palpations: There is no mass.     Tenderness: There is abdominal tenderness (RLQ). There is no guarding.  Genitourinary:    Comments: Chaperoned by RN Butler.  External genitalia unremarkable. Nor rashes or lesions noted.  Speculum exam with normal appearing whitish vaginal discharge and small amount of blood.  Vaginal wall mucosa is unremarkable.  No obvious drainable abscesses.  Blind cuff appreciated.  Neurological:     Mental Status: She is alert.     (all labs ordered are listed, but only abnormal results are displayed) Labs Reviewed  COMPREHENSIVE METABOLIC PANEL WITH GFR  - Abnormal; Notable for the following components:      Result Value   Glucose, Bld 107 (*)    Calcium  8.7 (*)    Total Protein 6.1 (*)    Albumin  3.2 (*)    All other components within normal limits  CBC - Abnormal; Notable for the following components:   Hemoglobin 11.6 (*)    All other components within normal limits  URINALYSIS, ROUTINE W REFLEX MICROSCOPIC - Abnormal; Notable for the following components:   APPearance HAZY (*)    Hgb urine dipstick MODERATE (*)    Leukocytes,Ua LARGE (*)    Bacteria, UA RARE (*)    All other components within normal limits  LIPASE, BLOOD    EKG: None  Radiology: No results found.  {Document cardiac monitor, telemetry assessment procedure when appropriate:32947} Procedures   Medications Ordered in the ED - No data to display  Clinical Course as of 07/18/24 1547  Mon Jul 18, 2024  1423 Discussed with IR (Dr Philip) and they believe that it is contiguous with her vaginal cuff and bladder. They do not believe that it would be amenable to percutaneous drainage but may be able to perform needle aspiration. Recommends talking to gynecology or urology.  [RP]  1439 Dw Vertell Pringle from general surgery. Felt that aspiration might be helpful and recommends abx. They don't want to do a fistula takedown at this point.  [RP]  1445 Dw Cam Sattenfield from urology.  [RP]    Clinical Course User Index [RP] Yolande Lamar BROCKS, MD   {Click here for ABCD2, HEART and other calculators REFRESH Note before signing:1}                              Medical Decision Making Amount and/or Complexity of Data Reviewed Labs: ordered. Radiology: ordered.  Risk Prescription drug management. Decision regarding hospitalization.   ***  {Document critical care time when appropriate  Document review of labs and clinical decision tools ie CHADS2VASC2, etc  Document your independent review of radiology images and any outside records  Document your discussion with  family members, caretakers and with consultants  Document social determinants of health affecting pt's care  Document your decision making why or why not admission, treatments were needed:32947:::1}   Final diagnoses:  None    ED Discharge Orders     None

## 2024-07-18 NOTE — ED Notes (Signed)
 RN chaperoned pelvic exam with EDP Jakie.

## 2024-07-18 NOTE — Consult Note (Signed)
 Chief Complaint: Patient was seen in consultation today for intra-abdominal abscess  Referring Physician(s): Dr. Vernal Alstrom  Supervising Physician: Philip Cornet  Patient Status: [facility] In-pt  History of Present Illness: Meghan Avila is a 73 y.o. female with a history of exploratory laparotomy and primary repair of a sigmoid colon perforation by Dr. Dasie following a colonoscopy on 3/3. She had a blake drain in the RLQ that has since been removed.  She has had intermittent abdominal pain since this time however presented to [facility] ED today with progressive, worsening RLQ pain, new left sided lower abdominal pain. Recent imaging shows an intra-abdominal fluid collection again demonstrated on CT Abdomen Pelvis today.   IR consulted for aspiration and drainage of intra-abdominal fluid collection.  Case reviewed by Dr. Philip who notes the collection is very small but potentially amenable to aspiration.   Discussed with patient at bedside who is agreeable to proceed.   Past Medical History:  Diagnosis Date   Arthritis    Family history of adverse reaction to anesthesia    post nausea and vomitting   GERD (gastroesophageal reflux disease)    hx of   H/O wheezing    occ.   Hand swelling    related to lymph nodes    Headache    migraines   History of hiatal hernia    Leg swelling    left leg -edema knee level to ankle.   Osteoporosis    Peripheral vascular disease    left leg    PONV (postoperative nausea and vomiting)    Scoliosis    Vertigo     Past Surgical History:  Procedure Laterality Date   ANAL RECTAL MANOMETRY N/A 08/03/2019   Procedure: ANO RECTAL MANOMETRY;  Surgeon: Debby Hila, MD;  Location: [facility];  Service: Endoscopy;  Laterality: N/A;   BACK SURGERY     scoliosis surgery 1970 and 1972   BREAST REDUCTION SURGERY  1997   CHOLECYSTECTOMY  1992   laparoscopic   COLONOSCOPY WITH PROPOFOL  N/A 04/19/2015   Procedure: COLONOSCOPY WITH PROPOFOL ;   Surgeon: Gladis MARLA Louder, MD;  Location: [facility];  Service: Endoscopy;  Laterality: N/A;   DEEP AXILLARY SENTINEL NODE BIOPSY / EXCISION     x2   ESOPHAGEAL MANOMETRY N/A 05/04/2015   Procedure: ESOPHAGEAL MANOMETRY (EM);  Surgeon: Gladis MARLA Louder, MD;  Location: [facility];  Service: Endoscopy;  Laterality: N/A;   ESOPHAGOGASTRODUODENOSCOPY (EGD) WITH PROPOFOL  N/A 04/19/2015   Procedure: ESOPHAGOGASTRODUODENOSCOPY (EGD) WITH PROPOFOL ;  Surgeon: Gladis MARLA Louder, MD;  Location: [facility];  Service: Endoscopy;  Laterality: N/A;   HEMORROIDECTOMY  2002, 2005   hiatal hernia surgery      JOINT REPLACEMENT     total right knee Dr. gerome 02-26-18   LAPAROTOMY N/A 12/07/2023   Procedure: EXPLORATORY LAPAROTOMY;  Surgeon: Dasie Leonor CROME, MD;  Location: [facility];  Service: General;  Laterality: N/A;   LASER ABLATION  2008   left leg    left knee surgery - torn meniscus (left)     LIPOMA EXCISION N/A 09/21/2015   Procedure: EXCISION 6CM BACK LIPOMA;  Surgeon: Lynda Leos, MD;  Location: [facility];  Service: General;  Laterality: N/A;   MENISCUS REPAIR     right   REDUCTION MAMMAPLASTY Bilateral 1998   right shoulder rotator cuff surgery      ROBOTIC ASSISTED LAPAROSCOPIC SACROCOLPOPEXY N/A 05/30/2021   Procedure: XI ROBOTIC ASSISTED LAPAROSCOPIC SACROCOLPOPEXY;  Surgeon: Cam Morene ORN, MD;  Location:  [facility];  Service: Urology;  Laterality: N/A;   scoliosis  1970, 1972   no retained hardware   TOTAL KNEE ARTHROPLASTY Right 02/26/2018   Procedure: RIGHT TOTAL KNEE ARTHROPLASTY;  Surgeon: Gerome Charleston, MD;  Location: [facility];  Service: Orthopedics;  Laterality: Right;    Allergies: Zantac [ranitidine hcl], Alendronate, Morphine  and codeine, Prednisone, and Tramadol  Medications: Prior to Admission medications   Medication Sig Start Date End Date Taking? Authorizing Provider  amitriptyline  (ELAVIL ) 10 MG tablet TAKE 1 TABLET BY MOUTH EVERYDAY AT BEDTIME 04/19/24   Avila, Amy,  NP  Ascorbic Acid (VITAMIN C PO) Take 2 tablets by mouth daily.    [provider]  aspirin -acetaminophen -caffeine  (EXCEDRIN  MIGRAINE) 250-250-65 MG tablet Take 1 tablet by mouth as needed for headache or migraine.    [provider]  atorvastatin  (LIPITOR) 10 MG tablet Take 10 mg by mouth in the morning.    [provider]  Calcium  Carb-Cholecalciferol 600-20 MG-MCG TABS Take 1 tablet by mouth daily at 12 noon.    [provider]  denosumab  (PROLIA ) 60 MG/ML SOSY injection Inject 60 mg into the skin every 6 (six) months.    [provider]  diclofenac sodium (VOLTAREN) 1 % GEL Apply 1 application  topically daily as needed (KNEE PAIN).    [provider]  methocarbamol  (ROBAXIN ) 750 MG tablet Take 1 tablet (750 mg total) by mouth every 8 (eight) hours as needed (use for muscle cramps/pain). 12/10/23   Meghan Cough, MD  metoprolol  tartrate (LOPRESSOR ) 25 MG tablet TAKE 1 TABLET BY MOUTH TWICE A DAY 06/03/24   Mallipeddi, Meghan P, MD  Multiple Vitamins-Minerals (MULTIVITAMIN WITH MINERALS) tablet Take 1 tablet by mouth daily.    [provider]  oxyCODONE  (OXY IR/ROXICODONE ) 5 MG immediate release tablet Take 1 tablet (5 mg total) by mouth every 4 (four) hours as needed for moderate pain (pain score 4-6). 12/10/23   Meghan Cough, MD     Family History  Problem Relation Age of Onset   Varicose Veins Mother    Rheum arthritis Father    Cancer Sister    Heart disease Sister        before age 90   Hypertension Sister    Varicose Veins Sister    Heart attack Sister    Rheum arthritis Sister    Allergies Sister    Hyperlipidemia Brother    Sleep apnea Brother    Breast cancer Neg Hx     Social History   Socioeconomic History   Marital status: Widowed    Spouse name: Not on file   Number of children: Not on file   Years of education: Not on file   Highest education level: Not on file  Occupational History    Occupation: Payroll Admin   Tobacco Use   Smoking status: Former    Current packs/day: 0.00    Average packs/day: 1 pack/day for 5.0 years (5.0 ttl pk-yrs)    Types: Cigarettes    Start date: 10/06/1973    Quit date: 10/06/1978    Years since quitting: 45.8   Smokeless tobacco: Never  Vaping Use   Vaping status: Never Used  Substance and Sexual Activity   Alcohol use: Yes    Comment: occ beer or glass of wine   Drug use: No   Sexual activity: Yes  Other Topics Concern   Not on file  Social History Narrative   Lives at home alone   Right handed  Caffeine : 1-2 cups/day   Retired    Teacher, early years/pre Strain: Not on [employer] Insecurity: No Food Insecurity (07/18/2024)   Hunger Vital Sign    Worried About Running Out of Food in the Last Year: Never true    Ran Out of Food in the Last Year: Never true  Transportation Needs: No Transportation Needs (07/18/2024)   PRAPARE - Administrator, Civil Service (Medical): No    Lack of Transportation (Non-Medical): No  Physical Activity: Not on file  Stress: Not on file  Social Connections: Moderately Integrated (07/18/2024)   Social Connection and Isolation Panel    Frequency of Communication with Friends and Family: More than three times a week    Frequency of Social Gatherings with Friends and Family: More than three times a week    Attends Religious Services: More than 4 times per year    Active Member of [employer] or Organizations: Yes    Attends Banker Meetings: More than 4 times per year    Marital Status: Widowed     Review of Systems: A 12 point ROS discussed and pertinent positives are indicated in the HPI above.  All other systems are negative.  Review of Systems  Constitutional:  Negative for fatigue and fever.  Respiratory:  Negative for Avila and shortness of breath.   Cardiovascular:  Negative for chest pain.  Gastrointestinal:  Positive for abdominal pain. Negative  for nausea and vomiting.  Genitourinary:  Positive for urgency.  Musculoskeletal:  Negative for back pain.  Psychiatric/Behavioral:  Negative for behavioral problems and confusion.     Vital Signs: BP 125/88 (BP Location: Right Arm)   Pulse 81   Temp 98 F (36.7 C) (Oral)   Resp 16   Ht 5' (1.524 m)   Wt 152 lb (68.9 kg)   SpO2 96%   BMI 29.69 kg/m   Physical Exam Vitals and nursing note reviewed.  Constitutional:      General: She is not in acute distress.    Appearance: She is well-developed. She is not ill-appearing.  Cardiovascular:     Rate and Rhythm: Normal rate and regular rhythm.  Pulmonary:     Effort: Pulmonary effort is normal. No respiratory distress.     Breath sounds: Normal breath sounds.  Skin:    General: Skin is warm and dry.  Neurological:     General: No focal deficit present.     Mental Status: She is alert and oriented to person, place, and time.      MD Evaluation Airway: WNL Heart: WNL Abdomen: WNL Chest/ Lungs: WNL ASA  Classification: 3 Mallampati/Airway Score: Two   Imaging: CT ABDOMEN PELVIS W CONTRAST Result Date: 07/18/2024 CLINICAL DATA:  Right lower quadrant pain, abnormal pelvic MRI. EXAM: CT ABDOMEN AND PELVIS WITH CONTRAST TECHNIQUE: Multidetector CT imaging of the abdomen and pelvis was performed using the standard protocol following bolus administration of intravenous contrast. RADIATION DOSE REDUCTION: This exam was performed according to the departmental dose-optimization program which includes automated exposure control, adjustment of the mA and/or kV according to patient size and/or use of iterative reconstruction technique. CONTRAST:  75mL OMNIPAQUE  IOHEXOL  350 MG/ML SOLN COMPARISON:  Pelvic MRI 07/13/2024 and CT abdomen pelvis 12/07/2023. FINDINGS: Lower chest: No acute findings. Heart is at the upper limits of normal in size. No pericardial or pleural effusion. Distal esophagus is grossly unremarkable. Hepatobiliary: Liver  is unremarkable. Cholecystectomy with associated biliary ductal dilatation.  Pancreas: Negative. Spleen: Negative. Adrenals/Urinary Tract: Adrenal glands are unremarkable. 11 mm lesion in the lateral interpolar right kidney measures 74 Hounsfield units. 6.7 cm cyst off the lower pole right kidney. Subcentimeter low-attenuation lesion in the lower pole left kidney, too small to characterize. Kidneys are otherwise unremarkable. Ureters are decompressed. There may be posterior bladder wall thickening secondary to an adjacent inflammatory process to be discussed below. Stomach/Bowel: Tiny hiatal hernia. Stomach, small bowel, appendix and majority of the colon are unremarkable. Sigmoid diverticulosis with very mild adjacent haziness and stranding, best seen on coronal imaging. There is a tract that extends from the sigmoid colon, inferiorly to a thick-walled fluid collection along the right paramidline posterior bladder, measuring approximately 2.5 x 2.5 cm (coronal image 94). Associated thickening of the posterior bladder wall with possible extension along the ventral pelvic floor (2/60 7-71). Rectal anastomosis. Vascular/Lymphatic: Vascular structures are unremarkable. No pathologically enlarged lymph nodes. Reproductive: No adnexal Mass. Other: No free fluid. Mesenteries and peritoneum are otherwise unremarkable. Musculoskeletal: Degenerative changes in the spine. Levoconvex scoliosis. Unilateral pars defect at L5 with grade 2 anterolisthesis. IMPRESSION: 1. Residua of sigmoid diverticulitis with a tract extending inferiorly along the posteroinferior bladder and a small abscess posterior to the bladder. 2. 11 mm lesion in the lateral interpolar right kidney measures 74 Hounsfield units. Lesion is too small to properly characterize. Further evaluation with pre and post contrast MRI should be considered. Pre and post contrast CT could alternatively be performed, but would likely be of decreased accuracy given lesion  size. Electronically Signed   By: Newell Eke M.D.   On: 07/18/2024 14:09   MR PELVIS W WO CONTRAST Result Date: 07/15/2024 CLINICAL DATA:  Pelvic and perineal pain.  Urge incontinence. EXAM: MRI PELVIS WITHOUT AND WITH CONTRAST TECHNIQUE: Multiplanar multisequence MR imaging of the pelvis was performed both before and after administration of intravenous contrast. CONTRAST:  6 cc Vueway COMPARISON:  02/18/2024 virtual: CT.  Abdominopelvic CT 12/07/2023. FINDINGS: Portions of exam are mildly motion degraded. Urinary Tract: Lower pole right renal 5.4 cm cyst. No hydronephrosis or hydroureter. Mass effect upon the posterior right bladder wall from the cul-de-sac process detailed below. Bowel:  Sigmoid diverticulosis.  No bowel obstruction. Vascular/Lymphatic: No pelvic aneurysm or sidewall adenopathy. Reproductive:  Hysterectomy.  No adnexal mass. Other: Positioned superior to the right side of the vaginal cuff is a peripherally T2 hypointense, centrally moderately T2 hyperintense lesion or collection, including at 2.4 x 2.7 x 3.0 cm on images 43/3 and 21/5. This is definitely new since 12/07/2023 CT and equivocally present on the nondedicated CT virtual colonoscopy of 02/18/2024. Demonstrates peripheral hyperenhancement and central hypoenhancement, including on image 66/13 and image 43/15. Mild surrounding edema. On sagittal image 43/15, contiguous posterosuperior right bladder wall thickening. Moderate pelvic floor laxity. Musculoskeletal: Advanced lumbosacral spondylosis with grade 1 L5-S1 anterolisthesis on 24/15. IMPRESSION: 1. Mildly motion degraded exam. 2. Mixed signal, peripherally enhancing and centrally hypoenhancing 3.0 cm collection or lesion within the right-side of the pelvic cul-de-sac, new since 12/07/2023. Especially given surrounding edema, favored to represent chronic diverticulitis induced phlegmon with possible small component central fluid/abscess. Contiguous right posterior bladder wall  thickening is suspicious for cystitis. No specific findings to confirm fistulous communication to bladder. In the appropriate clinical setting, peritoneal metastasis could look similar (no primary malignancy history EMR). These results will be called to the ordering clinician or representative by the Radiologist Assistant, and communication documented in the PACS or [employer]. Electronically Signed   By: Rockey  Marthann M.D.   On: 07/15/2024 11:51    Labs:  CBC: Recent Labs    12/09/23 0725 12/10/23 0724 12/11/23 0744 07/18/24 1005  WBC 13.2* 9.9 6.4 7.6  HGB 9.4* 9.7* 9.8* 11.6*  HCT 28.9* 29.7* 29.8* 36.2  PLT 162 172 204 247    COAGS: No results for input(s): INR, APTT in the last 8760 hours.  BMP: Recent Labs    12/09/23 0725 12/10/23 0724 12/11/23 0744 07/18/24 1005  NA 136 139 137 139  K 2.7* 3.3* 3.8 3.8  CL 107 110 112* 107  CO2 24 23 22 23   GLUCOSE 97 103* 88 107*  BUN 15 13 11 17   CALCIUM  7.4* 7.2* 7.2* 8.7*  CREATININE 0.86 0.72 0.53 0.61  GFRNONAA >60 >60 >60 >60    LIVER FUNCTION TESTS: Recent Labs    12/07/23 1254 07/18/24 1005  BILITOT 0.5 0.5  AST 24 18  ALT 17 16  ALKPHOS 54 76  PROT 6.5 6.1*  ALBUMIN  3.9 3.2*    TUMOR MARKERS: No results for input(s): AFPTM, CEA, CA199, CHROMGRNA in the last 8760 hours.  Assessment and Plan: Intra-abdominal fluid collection Patient presents with abdominal pain.  Recent MR imaging showed a small intra-abdominal fluid collection redemonstrated on CT today.  IR asked to aspirate vs drain.  Case reviewed and approved by Dr. Philip.   NPO Avila MN.  INR ordered.  Patient understand aspiration may be performed from anterior or posterior approach.  Drain to be left in place only if sufficient fluid to support this.  She is agreeable.   Risks and benefits of aspiration and drainage was discussed with the patient and/or patient's family including, but not limited to bleeding, infection, damage to  adjacent structures or low yield requiring additional tests.  All of the questions were answered and there is agreement to proceed.  Consent signed and in chart.  Thank you for this interesting consult.  I greatly enjoyed meeting [facility] and look forward to participating in their care.  A copy of this report was sent to the requesting provider on this date.  Electronically Signed: Jonette Wassel Sue-Ellen Drexel Ivey, PA 07/18/2024, 5:09 PM   I spent a total of 40 Minutes    in face to face in clinical consultation, greater than 50% of which was counseling/coordinating care for intra-abdominal fluid collection.

## 2024-07-18 NOTE — Progress Notes (Signed)
 ED Pharmacy Antibiotic Sign Off An antibiotic consult was received from an ED provider for Zosyn  per pharmacy dosing for intra-abdominal infection. A chart review was completed to assess appropriateness.   The following one time order(s) were placed:  Zosyn  3.375 g IV  Further antibiotic and/or antibiotic pharmacy consults should be ordered by the admitting provider if indicated.   Thank you for allowing pharmacy to be a part of this patient's care.   Maurilio Patten, PharmD PGY1 Pharmacy Resident [facility] 07/18/2024 3:41 PM

## 2024-07-19 ENCOUNTER — Inpatient Hospital Stay (HOSPITAL_COMMUNITY)

## 2024-07-19 DIAGNOSIS — N281 Cyst of kidney, acquired: Secondary | ICD-10-CM | POA: Diagnosis not present

## 2024-07-19 DIAGNOSIS — Z9049 Acquired absence of other specified parts of digestive tract: Secondary | ICD-10-CM | POA: Diagnosis not present

## 2024-07-19 DIAGNOSIS — N3289 Other specified disorders of bladder: Secondary | ICD-10-CM | POA: Diagnosis not present

## 2024-07-19 DIAGNOSIS — R1031 Right lower quadrant pain: Secondary | ICD-10-CM | POA: Diagnosis not present

## 2024-07-19 DIAGNOSIS — K5730 Diverticulosis of large intestine without perforation or abscess without bleeding: Secondary | ICD-10-CM | POA: Diagnosis not present

## 2024-07-19 LAB — MAGNESIUM: Magnesium: 2 mg/dL (ref 1.7–2.4)

## 2024-07-19 LAB — COMPREHENSIVE METABOLIC PANEL WITH GFR
ALT: 13 U/L (ref 0–44)
AST: 16 U/L (ref 15–41)
Albumin: 2.7 g/dL — ABNORMAL LOW (ref 3.5–5.0)
Alkaline Phosphatase: 63 U/L (ref 38–126)
Anion gap: 10 (ref 5–15)
BUN: 12 mg/dL (ref 8–23)
CO2: 25 mmol/L (ref 22–32)
Calcium: 8.5 mg/dL — ABNORMAL LOW (ref 8.9–10.3)
Chloride: 105 mmol/L (ref 98–111)
Creatinine, Ser: 0.7 mg/dL (ref 0.44–1.00)
GFR, Estimated: >60 mL/min (ref >60)
Glucose, Bld: 99 mg/dL (ref 70–99)
Potassium: 3.7 mmol/L (ref 3.5–5.1)
Sodium: 140 mmol/L (ref 135–145)
Total Bilirubin: 0.6 mg/dL (ref 0.0–1.2)
Total Protein: 5.4 g/dL — ABNORMAL LOW (ref 6.5–8.1)

## 2024-07-19 LAB — CBC
HCT: 33.2 % — ABNORMAL LOW (ref 36.0–46.0)
Hemoglobin: 10.8 g/dL — ABNORMAL LOW (ref 12.0–15.0)
MCH: 26.5 pg (ref 26.0–34.0)
MCHC: 32.5 g/dL (ref 30.0–36.0)
MCV: 81.6 fL (ref 80.0–100.0)
Platelets: 220 K/uL (ref 150–400)
RBC: 4.07 MIL/uL (ref 3.87–5.11)
RDW: 14.2 % (ref 11.5–15.5)
WBC: 6.9 K/uL (ref 4.0–10.5)
nRBC: 0 % (ref 0.0–0.2)

## 2024-07-19 LAB — PROTIME-INR
INR: 1 (ref 0.8–1.2)
Prothrombin Time: 14.3 s (ref 11.4–15.2)

## 2024-07-19 MED ORDER — FENTANYL CITRATE (PF) 100 MCG/2ML IJ SOLN
INTRAMUSCULAR | Status: AC | PRN
  Administered 2024-07-19 (×2): 50 ug via INTRAVENOUS

## 2024-07-19 MED ORDER — LIDOCAINE HCL (PF) 1 % IJ SOLN
10.0000 mL | Freq: Once | INTRAMUSCULAR | Status: AC
  Administered 2024-07-19: 10 mL

## 2024-07-19 MED ORDER — IOHEXOL 300 MG/ML  SOLN
50.0000 mL | Freq: Once | INTRAMUSCULAR | Status: AC | PRN
  Administered 2024-07-19: 50 mL

## 2024-07-19 MED ORDER — LIDOCAINE HCL 1 % IJ SOLN
INTRAMUSCULAR | Status: AC
  Filled 2024-07-19: qty 10

## 2024-07-19 MED ORDER — MIDAZOLAM HCL 2 MG/2ML IJ SOLN
INTRAMUSCULAR | Status: AC | PRN
  Administered 2024-07-19: 1 mg via INTRAVENOUS
  Administered 2024-07-19 (×2): .5 mg via INTRAVENOUS

## 2024-07-19 MED ORDER — MIDAZOLAM HCL 2 MG/2ML IJ SOLN
INTRAMUSCULAR | Status: AC
  Filled 2024-07-19: qty 4

## 2024-07-19 MED ORDER — FENTANYL CITRATE (PF) 100 MCG/2ML IJ SOLN
INTRAMUSCULAR | Status: AC
  Filled 2024-07-19: qty 4

## 2024-07-19 NOTE — Plan of Care (Signed)

## 2024-07-19 NOTE — TOC Initial Note (Signed)
 Transition of Care [facility]) - Initial/Assessment Note    Patient Details  Name: [facility] MRN: [id number] Date of Birth: 1951/08/21  [facility]) CM/SW Contact:    Jeoffrey LITTIE Moose, [place] Phone Number: 07/19/2024, 9:29 AM  Clinical Narrative:                 Pt admitted from home due to RLQ pain and vaginal bleeding. No current TOC needs, please consult as needs arise following therapy eval.        Patient Goals and CMS Choice            Expected Discharge Plan and Services                                              Prior Living Arrangements/Services                       Activities of Daily Living   ADL Screening (condition at time of admission) Independently performs ADLs?: Yes (appropriate for developmental age) Is the patient deaf or have difficulty hearing?: No Does the patient have difficulty seeing, even when wearing glasses/contacts?: No Does the patient have difficulty concentrating, remembering, or making decisions?: No  Permission Sought/Granted                  Emotional Assessment              Admission diagnosis:  Abdominal pain [R10.9] Patient Active Problem List   Diagnosis Date Noted   Abdominal pain 07/18/2024   Colon perforation (HCC) 12/07/2023   Hypokalemia 12/07/2023   Renal cyst 12/07/2023   PAC (premature atrial contraction) 09/09/2023   NSVT (nonsustained ventricular tachycardia) (HCC) 09/09/2023   Family history of heart disease 09/09/2023   OSA (obstructive sleep apnea) 09/09/2023   Cystocele with prolapse 05/30/2021   Pain in joint of left shoulder 04/13/2019   Aftercare 03/22/2018   History of total knee replacement, right 03/22/2018   Pain in right knee 03/16/2018   Primary osteoarthritis of right knee 02/26/2018   S/P knee replacement 02/26/2018   Osteoarthritis 02/17/2018   S/P Nissen fundoplication (without gastrostomy tube) procedure 06/29/2015   Upper airway cough  syndrome 03/14/2015   Dyspnea 03/13/2015   Chronic venous insufficiency 07/21/2014   Lymphedema of arm 08/11/2011   PCP:  Cleotilde Planas, MD Pharmacy:   [facility] [phone] - [place] - [address] AT CORNER OF [facility] [address] [place] [address] Phone: [phone] Fax: [phone]  [id number] - [place], [place] - [address] AT CORNER OF [facility] [address] [place] [address] Phone: [phone] Fax: [phone]     Social Drivers of Health (SDOH) Social History: SDOH Screenings   Food Insecurity: No Food Insecurity (07/18/2024)  Housing: Low Risk  (07/18/2024)  Transportation Needs: No Transportation Needs (07/18/2024)  Utilities: Not At Risk (07/18/2024)  Social Connections: Moderately Integrated (07/18/2024)  Tobacco Use: Medium Risk (07/18/2024)   SDOH Interventions:     Readmission Risk Interventions     No data to display

## 2024-07-19 NOTE — Evaluation (Signed)
 Physical Therapy Evaluation and Discharge Patient Details Name: Meghan Avila MRN: [id number] DOB: 1950-10-15 Today's Date: 07/19/2024  History of Present Illness  Pt is 73 yo female who presents on 07/18/24 with abdominal pain. CT of pelvis showed sigmoid diverticulitis and small abscess posterior to the bladder.   PMH: GERD, perforated colon with ex lap on 12/07/23, HLD, migraines, PVC's, PVD  Clinical Impression  Patient evaluated by Physical Therapy with no further acute PT needs identified. All education has been completed and the patient has no further questions. Pt from home alone where she is independent and lives alone. Pt with migraine headache and mild nausea on eval, no abdominal pain. Pt mod I with activity, ambulated 200' without AD or assist. Pt with noted swelling BLE's. PT is signing off. Thank you for this referral.         If plan is discharge home, recommend the following:     Can travel by private vehicle        Equipment Recommendations None recommended by PT  Recommendations for Other Services       Functional Status Assessment Patient has not had a recent decline in their functional status     Precautions / Restrictions Precautions Precautions: None Recall of Precautions/Restrictions: Intact Restrictions Weight Bearing Restrictions Per Provider Order: No      Mobility  Bed Mobility Overal bed mobility: Modified Independent                  Transfers Overall transfer level: Modified independent Equipment used: None                    Ambulation/Gait Ambulation/Gait assistance: Modified independent (Device/Increase time) Gait Distance (Feet): 200 Feet Assistive device: None Gait Pattern/deviations: Step-through pattern Gait velocity: WFL Gait velocity interpretation: >2.62 ft/sec, indicative of community ambulatory   General Gait Details: pt with mildly widened stance, no LOB, seemed to have slightly shortened L step  length  Stairs            Wheelchair Mobility     Tilt Bed    Modified Rankin (Stroke Patients Only)       Balance Overall balance assessment: Mild deficits observed, not formally tested                                           Pertinent Vitals/Pain Pain Assessment Pain Assessment: Faces Faces Pain Scale: Hurts little more Pain Location: migraine Pain Descriptors / Indicators: Headache Pain Intervention(s): Limited activity within patient's tolerance, Monitored during session    Home Living Family/patient expects to be discharged to:: Private residence Living Arrangements: Alone Available Help at Discharge: Available PRN/intermittently;Friend(s) Type of Home: House Home Access: Stairs to enter Entrance Stairs-Rails: Right Entrance Stairs-Number of Steps: 5 Alternate Level Stairs-Number of Steps: flight Home Layout: Two level;Able to live on main level with bedroom/bathroom Home Equipment: Rolling Walker (2 wheels);BSC/3in1      Prior Function Prior Level of Function : Independent/Modified Independent;Driving             Mobility Comments: no AD used, goes to the gym several times per week ADLs Comments: independent     Extremity/Trunk Assessment   Upper Extremity Assessment Upper Extremity Assessment: Overall WFL for tasks assessed    Lower Extremity Assessment Lower Extremity Assessment: Overall WFL for tasks assessed (BLE swelling noted)    Cervical / Trunk  Assessment Cervical / Trunk Assessment: Other exceptions Cervical / Trunk Exceptions: scoliosis  Communication   Communication Communication: No apparent difficulties    Cognition Arousal: Alert Behavior During Therapy: WFL for tasks assessed/performed   PT - Cognitive impairments: No apparent impairments                         Following commands: Intact       Cueing Cueing Techniques: Verbal cues     General Comments General comments (skin  integrity, edema, etc.): VSS. No abdominal pain today but mild nausea noted    Exercises     Assessment/Plan    PT Assessment Patient does not need any further PT services  PT Problem List         PT Treatment Interventions      PT Goals (Current goals can be found in the Care Plan section)  Acute Rehab PT Goals Patient Stated Goal: return home PT Goal Formulation: All assessment and education complete, DC therapy    Frequency       Co-evaluation               AM-PAC PT 6 Clicks Mobility  Outcome Measure Help needed turning from your back to your side while in a flat bed without using bedrails?: None Help needed moving from lying on your back to sitting on the side of a flat bed without using bedrails?: None Help needed moving to and from a bed to a chair (including a wheelchair)?: None Help needed standing up from a chair using your arms (e.g., wheelchair or bedside chair)?: None Help needed to walk in hospital room?: None Help needed climbing 3-5 steps with a railing? : None 6 Click Score: 24    End of Session   Activity Tolerance: Patient tolerated treatment well Patient left: in bed;with call bell/phone within reach Nurse Communication: Mobility status PT Visit Diagnosis: Pain Pain - part of body:  (head)    Time: 8697-8675 PT Time Calculation (min) (ACUTE ONLY): 22 min   Charges:   PT Evaluation $PT Eval Low Complexity: 1 Low   PT General Charges $$ ACUTE PT VISIT: 1 Visit         Richerd Lipoma, PT  Acute Rehab Services Secure chat preferred Office [phone]   Richerd CROME Fidencio Duddy 07/19/2024, 1:55 PM

## 2024-07-19 NOTE — Procedures (Signed)
 Vascular and Interventional Radiology Procedure Note  Patient: Meghan Avila DOB: May 01, 1951 Medical Record Number: [id number] Note Date/Time: 07/19/24 9:09 AM   Performing Physician: Thom Hall, MD Assistant(s): None  Diagnosis: Diverticular abscess  Procedure: ASPIRATION OF PELVIC ABSCESS  Anesthesia: Conscious Sedation Complications: None Estimated Blood Loss: Minimal Specimens: Sent for Gram Stain, Aerobe Culture, and Anerobe Culture  Findings:  Successful CT-guided diagnostic aspiration of a small pelvic abscess. 2-3 mL of lavaged SS fluid was drawn and submitted for microbiological analysis   See detailed procedure note with images in PACS. The patient tolerated the procedure well without incident or complication and was returned to Recovery in stable condition.    Thom Hall, MD Vascular and Interventional Radiology Specialists [facility]   Pager. [phone] Clinic. [phone]

## 2024-07-19 NOTE — Consult Note (Signed)
 Urology Consult Note   Requesting Attending Physician:  Leotis Bogus, MD Service Providing Consult: Urology  Consulting Attending: Dr. Cam   Reason for Consult:  fluid collection near bladder  HPI: Meghan Avila is seen in consultation for reasons noted above at the request of Leotis Bogus, MD. Patient is a 73 y.o. female presenting RLQ pain and abnormal imaging.  Patient's complications initially began decades ago, following a hemorrhoidectomy which resulted in muscular and nerve damage to the anal sphincter.  She has had issues with urgency and intermittent incontinence of stool since.  Her routine colonoscopy was unfortunately complicated by perforation earlier this year.  She underwent ex lap and colorrhaphy on 12/07/2023.  She has had ongoing RLQ pain, particularly after an exercise class the following month.  She has been seeing she was referred to our office for concern of mesh erosion noted during GYN exam.  She met with Dr. Cam on 06/28/2024.  Examination in clinic revealed some hypertrophic tissue over the right apex of the vagina where there may be some mesh erosion. She was advised to continue her Estrace  cream for 1 more month and then discontinue.  On 07/13/2024 she was directed to collect an MRI of the pelvis which revealed a 3 cm lesion abutting the bladder.  As we were following her and Dr. Cam had performed a sacrocolpopexy few years ago, urology was consulted to speak to the CT findings.   On my arrival patient was resting in bed having lunch.  We reviewed her urologic and GI history as well as potential interventions, pending CT results.  She has been having some reddish-brownish discharge for some time, unclear if this is urinary or vaginal source per patient.  ------------------  Assessment:   73 y.o. female with pelvic fluid collection concerning for fistulous communication between sigmoid colon, fluid collection, and vaginal  cuff   Recommendations: #pelvic fluid collection   Discussed with General Surgery.  CT A/P with rectal contrast pending to further characterize small fluid collection posterior to the bladder concerning for abscess.  Possibility of sigmoid abscess +/- communication with abscess +/- communication with vaginal cuff.  Labs and VSS, UA unremarkable, afebrile, no sign of systemic infection. Receiving broad ABX.  Will reevaluate following results of scan.  Case and plan discussed with Dr. Cam  Past Medical History: Past Medical History:  Diagnosis Date   Arthritis    Family history of adverse reaction to anesthesia    post nausea and vomitting   GERD (gastroesophageal reflux disease)    hx of   H/O wheezing    occ.   Hand swelling    related to lymph nodes    Headache    migraines   History of hiatal hernia    Leg swelling    left leg -edema knee level to ankle.   Osteoporosis    Peripheral vascular disease    left leg    PONV (postoperative nausea and vomiting)    Scoliosis    Vertigo     Past Surgical History:  Past Surgical History:  Procedure Laterality Date   ANAL RECTAL MANOMETRY N/A 08/03/2019   Procedure: ANO RECTAL MANOMETRY;  Surgeon: Debby Hila, MD;  Location: [facility];  Service: Endoscopy;  Laterality: N/A;   BACK SURGERY     scoliosis surgery 1970 and 1972   BREAST REDUCTION SURGERY  1997   CHOLECYSTECTOMY  1992   laparoscopic   COLONOSCOPY WITH PROPOFOL  N/A 04/19/2015   Procedure: COLONOSCOPY WITH PROPOFOL ;  Surgeon: Gladis MARLA Louder, MD;  Location: [facility];  Service: Endoscopy;  Laterality: N/A;   DEEP AXILLARY SENTINEL NODE BIOPSY / EXCISION     x2   ESOPHAGEAL MANOMETRY N/A 05/04/2015   Procedure: ESOPHAGEAL MANOMETRY (EM);  Surgeon: Gladis MARLA Louder, MD;  Location: [facility];  Service: Endoscopy;  Laterality: N/A;   ESOPHAGOGASTRODUODENOSCOPY (EGD) WITH PROPOFOL  N/A 04/19/2015   Procedure: ESOPHAGOGASTRODUODENOSCOPY (EGD) WITH  PROPOFOL ;  Surgeon: Gladis MARLA Louder, MD;  Location: [facility];  Service: Endoscopy;  Laterality: N/A;   HEMORROIDECTOMY  2002, 2005   hiatal hernia surgery      JOINT REPLACEMENT     total right knee Dr. gerome 02-26-18   LAPAROTOMY N/A 12/07/2023   Procedure: EXPLORATORY LAPAROTOMY;  Surgeon: Dasie Leonor CROME, MD;  Location: [facility];  Service: General;  Laterality: N/A;   LASER ABLATION  2008   left leg    left knee surgery - torn meniscus (left)     LIPOMA EXCISION N/A 09/21/2015   Procedure: EXCISION 6CM BACK LIPOMA;  Surgeon: Lynda Leos, MD;  Location: [facility];  Service: General;  Laterality: N/A;   MENISCUS REPAIR     right   REDUCTION MAMMAPLASTY Bilateral 1998   right shoulder rotator cuff surgery      ROBOTIC ASSISTED LAPAROSCOPIC SACROCOLPOPEXY N/A 05/30/2021   Procedure: XI ROBOTIC ASSISTED LAPAROSCOPIC SACROCOLPOPEXY;  Surgeon: Cam Morene ORN, MD;  Location: [facility];  Service: Urology;  Laterality: N/A;   scoliosis  1970, 1972   no retained hardware   TOTAL KNEE ARTHROPLASTY Right 02/26/2018   Procedure: RIGHT TOTAL KNEE ARTHROPLASTY;  Surgeon: gerome Charleston, MD;  Location: [facility];  Service: Orthopedics;  Laterality: Right;    Medication: Current Facility-Administered Medications  Medication Dose Route Frequency Provider Last Rate Last Admin   0.9 %  sodium chloride  infusion   Intravenous Continuous Pokhrel, Laxman, MD 40 mL/hr at 07/18/24 1710 New Bag at 07/18/24 1710   0.9 %  sodium chloride  infusion  250 mL Intravenous PRN Pokhrel, Laxman, MD       acetaminophen  (TYLENOL ) tablet 650 mg  650 mg Oral Q6H PRN Pokhrel, Laxman, MD   650 mg at 07/19/24 1026   Or   acetaminophen  (TYLENOL ) suppository 650 mg  650 mg Rectal Q6H PRN Pokhrel, Laxman, MD       amitriptyline  (ELAVIL ) tablet 10 mg  10 mg Oral QHS Pokhrel, Laxman, MD   10 mg at 07/18/24 2140   aspirin -acetaminophen -caffeine  (EXCEDRIN  MIGRAINE) per tablet 1 tablet  1 tablet Oral PRN Pokhrel, Laxman, MD        atorvastatin  (LIPITOR) tablet 10 mg  10 mg Oral q AM Pokhrel, Laxman, MD   10 mg at 07/19/24 1013   calcium -vitamin D  (OSCAL WITH D) 500-5 MG-MCG per tablet 1 tablet  1 tablet Oral Q breakfast Merilee Linsey I, RPH   1 tablet at 07/19/24 1014   hydrALAZINE (APRESOLINE) injection 5 mg  5 mg Intravenous Q6H PRN Pokhrel, Laxman, MD       methocarbamol  (ROBAXIN ) tablet 750 mg  750 mg Oral Q8H PRN Pokhrel, Laxman, MD       metoprolol  tartrate (LOPRESSOR ) tablet 25 mg  25 mg Oral BID Pokhrel, Laxman, MD   25 mg at 07/19/24 1013   ondansetron  (ZOFRAN ) tablet 4 mg  4 mg Oral Q6H PRN Pokhrel, Laxman, MD   4 mg at 07/19/24 1026   Or   ondansetron  (ZOFRAN ) injection 4 mg  4 mg Intravenous Q6H PRN Pokhrel, Laxman, MD  oxyCODONE  (Oxy IR/ROXICODONE ) immediate release tablet 5 mg  5 mg Oral Q4H PRN Pokhrel, Laxman, MD       piperacillin -tazobactam (ZOSYN ) IVPB 3.375 g  3.375 g Intravenous Q8H Pokhrel, Laxman, MD 12.5 mL/hr at 07/19/24 1012 3.375 g at 07/19/24 1012   sodium chloride  flush (NS) 0.9 % injection 3 mL  3 mL Intravenous Q12H Pokhrel, Laxman, MD   3 mL at 07/19/24 1012   sodium chloride  flush (NS) 0.9 % injection 3 mL  3 mL Intravenous PRN Pokhrel, Laxman, MD        Allergies: Allergies  Allergen Reactions   Zantac [Ranitidine Hcl] Anaphylaxis and Swelling   Alendronate Diarrhea    Burping   Morphine  And Codeine Nausea And Vomiting    Morphine , Hydrocodone  Tolerates Dilaudid , Fentanyl  per Epic   Prednisone Diarrhea and Nausea And Vomiting   Tramadol Nausea And Vomiting    AMS    Social History: Social History   Tobacco Use   Smoking status: Former    Current packs/day: 0.00    Average packs/day: 1 pack/day for 5.0 years (5.0 ttl pk-yrs)    Types: Cigarettes    Start date: 10/06/1973    Quit date: 10/06/1978    Years since quitting: 45.8   Smokeless tobacco: Never  Vaping Use   Vaping status: Never Used  Substance Use Topics   Alcohol use: Yes    Comment: occ beer or glass  of wine   Drug use: No    Family History Family History  Problem Relation Age of Onset   Varicose Veins Mother    Rheum arthritis Father    Cancer Sister    Heart disease Sister        before age 11   Hypertension Sister    Varicose Veins Sister    Heart attack Sister    Rheum arthritis Sister    Allergies Sister    Hyperlipidemia Brother    Sleep apnea Brother    Breast cancer Neg Hx     Review of Systems  Genitourinary:  Negative for dysuria, flank pain, frequency, hematuria and urgency.     Objective   Vital signs in last 24 hours: BP (!) 125/90 (BP Location: Right Arm)   Pulse 80   Temp 98.1 F (36.7 C) (Oral)   Resp 17   Ht 5' (1.524 m)   Wt 69.9 kg   SpO2 90%   BMI 30.10 kg/m   Physical Exam General: A&O, resting, appropriate HEENT: /AT Pulmonary: Normal work of breathing Cardiovascular: no cyanosis    Most Recent Labs: Lab Results  Component Value Date   WBC 6.9 07/19/2024   HGB 10.8 (L) 07/19/2024   HCT 33.2 (L) 07/19/2024   PLT 220 07/19/2024    Lab Results  Component Value Date   NA 140 07/19/2024   K 3.7 07/19/2024   CL 105 07/19/2024   CO2 25 07/19/2024   BUN 12 07/19/2024   CREATININE 0.70 07/19/2024   CALCIUM  8.5 (L) 07/19/2024   MG 2.0 07/19/2024    Lab Results  Component Value Date   INR 1.0 07/19/2024   APTT 32 02/17/2018     Urine Culture: @[id number] (laburin,org,[id number],[id number])@   IMAGING: CT GUIDED NEEDLE PLACEMENT Result Date: 07/19/2024 INDICATION: [id number] Intra-abdominal fluid collection [phone]. DIVERTICULAR ABSCESS. EXAM: CT-GUIDED DIVERTICULAR ABSCESS ASPIRATION COMPARISON:  CT AP, 07/18/2024. MEDICATIONS: The patient is currently admitted to the hospital and receiving intravenous antibiotics. The antibiotics were administered within an appropriate time frame prior to the initiation  of the procedure. ANESTHESIA/SEDATION: Moderate (conscious) sedation was employed during this procedure. A total of Versed  2 mg and  Fentanyl  100 mcg was administered intravenously. Moderate Sedation Time: 17 minutes. The patient's level of consciousness and vital signs were monitored continuously by radiology nursing throughout the procedure under my direct supervision. CONTRAST:  None FLUOROSCOPY TIME:  CT dose; 384 mGycm COMPLICATIONS: None immediate. PROCEDURE: RADIATION DOSE REDUCTION: This exam was performed according to the departmental dose-optimization program which includes automated exposure control, adjustment of the mA and/or kV according to patient size and/or use of iterative reconstruction technique. Informed written consent was obtained from the patient after a discussion of the risks, benefits and alternatives to treatment. The patient was placed prone on the CT gantry and a pre procedural CT was performed re-demonstrating the small abscess/fluid collection within the deep pelvis. The procedure was planned. A timeout was performed prior to the initiation of the procedure. The RIGHT gluteus was prepped and draped in the usual sterile fashion. The overlying soft tissues were anesthetized with 1% lidocaine  with epinephrine . Appropriate trajectory was planned with the use of a 22 gauge spinal needle. An 18 gauge trocar needle was advanced into the abscess/fluid collection then aspiration was performed. 2 mL of serosanguineous fluid was aspirated. The needle was removed. A dressing was placed. The patient tolerated the procedure well without immediate post procedural complication. IMPRESSION: Successful CT-guided diagnostic aspiration of small pelvic diverticular abscess with aspiration of 2 mL of serosanguineous fluid. Samples were sent to the laboratory as requested by the ordering clinical team. Thom Hall, MD Vascular and Interventional Radiology Specialists [facility] Electronically Signed   By: Thom Hall M.D.   On: 07/19/2024 13:00   CT ABDOMEN PELVIS W CONTRAST Result Date: 07/18/2024 CLINICAL DATA:  Right  lower quadrant pain, abnormal pelvic MRI. EXAM: CT ABDOMEN AND PELVIS WITH CONTRAST TECHNIQUE: Multidetector CT imaging of the abdomen and pelvis was performed using the standard protocol following bolus administration of intravenous contrast. RADIATION DOSE REDUCTION: This exam was performed according to the departmental dose-optimization program which includes automated exposure control, adjustment of the mA and/or kV according to patient size and/or use of iterative reconstruction technique. CONTRAST:  75mL OMNIPAQUE  IOHEXOL  350 MG/ML SOLN COMPARISON:  Pelvic MRI 07/13/2024 and CT abdomen pelvis 12/07/2023. FINDINGS: Lower chest: No acute findings. Heart is at the upper limits of normal in size. No pericardial or pleural effusion. Distal esophagus is grossly unremarkable. Hepatobiliary: Liver is unremarkable. Cholecystectomy with associated biliary ductal dilatation. Pancreas: Negative. Spleen: Negative. Adrenals/Urinary Tract: Adrenal glands are unremarkable. 11 mm lesion in the lateral interpolar right kidney measures 74 Hounsfield units. 6.7 cm cyst off the lower pole right kidney. Subcentimeter low-attenuation lesion in the lower pole left kidney, too small to characterize. Kidneys are otherwise unremarkable. Ureters are decompressed. There may be posterior bladder wall thickening secondary to an adjacent inflammatory process to be discussed below. Stomach/Bowel: Tiny hiatal hernia. Stomach, small bowel, appendix and majority of the colon are unremarkable. Sigmoid diverticulosis with very mild adjacent haziness and stranding, best seen on coronal imaging. There is a tract that extends from the sigmoid colon, inferiorly to a thick-walled fluid collection along the right paramidline posterior bladder, measuring approximately 2.5 x 2.5 cm (coronal image 94). Associated thickening of the posterior bladder wall with possible extension along the ventral pelvic floor (2/60 7-71). Rectal anastomosis.  Vascular/Lymphatic: Vascular structures are unremarkable. No pathologically enlarged lymph nodes. Reproductive: No adnexal Mass. Other: No free fluid. Mesenteries and peritoneum are otherwise  unremarkable. Musculoskeletal: Degenerative changes in the spine. Levoconvex scoliosis. Unilateral pars defect at L5 with grade 2 anterolisthesis. IMPRESSION: 1. Residua of sigmoid diverticulitis with a tract extending inferiorly along the posteroinferior bladder and a small abscess posterior to the bladder. 2. 11 mm lesion in the lateral interpolar right kidney measures 74 Hounsfield units. Lesion is too small to properly characterize. Further evaluation with pre and post contrast MRI should be considered. Pre and post contrast CT could alternatively be performed, but would likely be of decreased accuracy given lesion size. Electronically Signed   By: Newell Eke M.D.   On: 07/18/2024 14:09    ------  Ole Bourdon, NP Pager: [phone]   Please contact the urology consult pager with any further questions/concerns.

## 2024-07-19 NOTE — Progress Notes (Signed)
 Progress Note     Subjective: Patient denies pain. Having minimal nausea. Denies vomiting. Having bowel movements. Denies flatulence.  IR performed aspiration of pelvic abscess today.  ROS  Per HPI  Objective: Vital signs in last 24 hours: Temp:  [97.8 F (36.6 C)-98.3 F (36.8 C)] 98.1 F (36.7 C) (10/14 1004) Pulse Rate:  [56-110] 80 (10/14 1004) Resp:  [7-24] 17 (10/14 1004) BP: (109-146)/(66-92) 125/90 (10/14 1004) SpO2:  [90 %-100 %] 90 % (10/14 1004) Weight:  [69.9 kg] 69.9 kg (10/14 0500) Last BM Date : 07/18/24  Intake/Output from previous day: 10/13 0701 - 10/14 0700 In: 897.9 [P.O.:420; I.V.:427.9; IV Piggyback:50] Out: -  Intake/Output this shift: No intake/output data recorded.  PE: General: Pleasant female who is laying in bed in NAD. HEENT: Head is normocephalic, atraumatic.  Heart: Pulse normal during encounter. Lungs: Respiratory effort nonlabored Abd: Soft, NT, ND. Midline incision present. RLQ drain site scar also present. No rebound tenderness or guarding.  Skin: Incision of right buttock present and covered with dressing. Dressing C/D/I. Psych: A&Ox3 with an appropriate affect.    Lab Results:  Recent Labs    07/18/24 1005 07/19/24 0432  WBC 7.6 6.9  HGB 11.6* 10.8*  HCT 36.2 33.2*  PLT 247 220   BMET Recent Labs    07/18/24 1005 07/19/24 0432  NA 139 140  K 3.8 3.7  CL 107 105  CO2 23 25  GLUCOSE 107* 99  BUN 17 12  CREATININE 0.61 0.70  CALCIUM  8.7* 8.5*   PT/INR Recent Labs    07/19/24 0432  LABPROT 14.3  INR 1.0   CMP     Component Value Date/Time   NA 140 07/19/2024 0432   NA 144 11/12/2022 1140   K 3.7 07/19/2024 0432   CL 105 07/19/2024 0432   CO2 25 07/19/2024 0432   GLUCOSE 99 07/19/2024 0432   BUN 12 07/19/2024 0432   BUN 17 11/12/2022 1140   CREATININE 0.70 07/19/2024 0432   CALCIUM  8.5 (L) 07/19/2024 0432   PROT 5.4 (L) 07/19/2024 0432   PROT 6.0 11/12/2022 1140   ALBUMIN  2.7 (L) 07/19/2024 0432    ALBUMIN  4.4 11/12/2022 1140   AST 16 07/19/2024 0432   ALT 13 07/19/2024 0432   ALKPHOS 63 07/19/2024 0432   BILITOT 0.6 07/19/2024 0432   BILITOT 0.2 11/12/2022 1140   GFRNONAA >60 07/19/2024 0432   GFRAA >60 02/27/2018 0454   Lipase     Component Value Date/Time   LIPASE 38 07/18/2024 1005       Studies/Results: CT GUIDED NEEDLE PLACEMENT Result Date: 07/19/2024 INDICATION: [id number] Intra-abdominal fluid collection [phone]. DIVERTICULAR ABSCESS. EXAM: CT-GUIDED DIVERTICULAR ABSCESS ASPIRATION COMPARISON:  CT AP, 07/18/2024. MEDICATIONS: The patient is currently admitted to the hospital and receiving intravenous antibiotics. The antibiotics were administered within an appropriate time frame prior to the initiation of the procedure. ANESTHESIA/SEDATION: Moderate (conscious) sedation was employed during this procedure. A total of Versed  2 mg and Fentanyl  100 mcg was administered intravenously. Moderate Sedation Time: 17 minutes. The patient's level of consciousness and vital signs were monitored continuously by radiology nursing throughout the procedure under my direct supervision. CONTRAST:  None FLUOROSCOPY TIME:  CT dose; 384 mGycm COMPLICATIONS: None immediate. PROCEDURE: RADIATION DOSE REDUCTION: This exam was performed according to the departmental dose-optimization program which includes automated exposure control, adjustment of the mA and/or kV according to patient size and/or use of iterative reconstruction technique. Informed written consent was obtained from the patient after  a discussion of the risks, benefits and alternatives to treatment. The patient was placed prone on the CT gantry and a pre procedural CT was performed re-demonstrating the small abscess/fluid collection within the deep pelvis. The procedure was planned. A timeout was performed prior to the initiation of the procedure. The RIGHT gluteus was prepped and draped in the usual sterile fashion. The overlying soft tissues  were anesthetized with 1% lidocaine  with epinephrine . Appropriate trajectory was planned with the use of a 22 gauge spinal needle. An 18 gauge trocar needle was advanced into the abscess/fluid collection then aspiration was performed. 2 mL of serosanguineous fluid was aspirated. The needle was removed. A dressing was placed. The patient tolerated the procedure well without immediate post procedural complication. IMPRESSION: Successful CT-guided diagnostic aspiration of small pelvic diverticular abscess with aspiration of 2 mL of serosanguineous fluid. Samples were sent to the laboratory as requested by the ordering clinical team. Thom Hall, MD [facility] Electronically Signed   By: Thom Hall M.D.   On: 07/19/2024 13:00   CT ABDOMEN PELVIS W CONTRAST Result Date: 07/18/2024 CLINICAL DATA:  Right lower quadrant pain, abnormal pelvic MRI. EXAM: CT ABDOMEN AND PELVIS WITH CONTRAST TECHNIQUE: Multidetector CT imaging of the abdomen and pelvis was performed using the standard protocol following bolus administration of intravenous contrast. RADIATION DOSE REDUCTION: This exam was performed according to the departmental dose-optimization program which includes automated exposure control, adjustment of the mA and/or kV according to patient size and/or use of iterative reconstruction technique. CONTRAST:  75mL OMNIPAQUE  IOHEXOL  350 MG/ML SOLN COMPARISON:  Pelvic MRI 07/13/2024 and CT abdomen pelvis 12/07/2023. FINDINGS: Lower chest: No acute findings. Heart is at the upper limits of normal in size. No pericardial or pleural effusion. Distal esophagus is grossly unremarkable. Hepatobiliary: Liver is unremarkable. Cholecystectomy with associated biliary ductal dilatation. Pancreas: Negative. Spleen: Negative. Adrenals/Urinary Tract: Adrenal glands are unremarkable. 11 mm lesion in the lateral interpolar right kidney measures 74 Hounsfield units. 6.7 cm cyst  off the lower pole right kidney. Subcentimeter low-attenuation lesion in the lower pole left kidney, too small to characterize. Kidneys are otherwise unremarkable. Ureters are decompressed. There may be posterior bladder wall thickening secondary to an adjacent inflammatory process to be discussed below. Stomach/Bowel: Tiny hiatal hernia. Stomach, small bowel, appendix and majority of the colon are unremarkable. Sigmoid diverticulosis with very mild adjacent haziness and stranding, best seen on coronal imaging. There is a tract that extends from the sigmoid colon, inferiorly to a thick-walled fluid collection along the right paramidline posterior bladder, measuring approximately 2.5 x 2.5 cm (coronal image 94). Associated thickening of the posterior bladder wall with possible extension along the ventral pelvic floor (2/60 7-71). Rectal anastomosis. Vascular/Lymphatic: Vascular structures are unremarkable. No pathologically enlarged lymph nodes. Reproductive: No adnexal Mass. Other: No free fluid. Mesenteries and peritoneum are otherwise unremarkable. Musculoskeletal: Degenerative changes in the spine. Levoconvex scoliosis. Unilateral pars defect at L5 with grade 2 anterolisthesis. IMPRESSION: 1. Residua of sigmoid diverticulitis with a tract extending inferiorly along the posteroinferior bladder and a small abscess posterior to the bladder. 2. 11 mm lesion in the lateral interpolar right kidney measures 74 Hounsfield units. Lesion is too small to properly characterize. Further evaluation with pre and post contrast MRI should be considered. Pre and post contrast CT could alternatively be performed, but would likely be of decreased accuracy given lesion size. Electronically Signed   By: Newell Eke M.D.   On: 07/18/2024 14:09  Anti-infectives: Anti-infectives (From admission, onward)    Start     Dose/Rate Route Frequency Ordered Stop   07/19/24 0000  piperacillin -tazobactam (ZOSYN ) IVPB 3.375 g         3.375 g 12.5 mL/hr over 240 Minutes Intravenous Every 8 hours 07/18/24 1617     07/18/24 1545  piperacillin -tazobactam (ZOSYN ) IVPB 3.375 g        3.375 g 100 mL/hr over 30 Minutes Intravenous  Once 07/18/24 1541 07/18/24 1656        Assessment/Plan Pelvic fluid collection Hx of laparotomy and primary repair of sigmoid colon perforation 12/07/2023 - Afebrile. - WBC 6.9; HGB 10.8 from 11.6 - IR completed aspiration of abscess today and retrieved 2-3 mL of SS fluid that was sent for analysis.  - No abdominal pain. No vomiting. Having bowel function. - Okay to initiate soft diet. - Will plan for CT imaging with contrast for further assessment. - Will continue to follow.  FEN: Soft; IVF per primary team VTE: Scds ID: [email or web address]     LOS: 1 day   I reviewed specialist notes, hospitalist notes, last 24 h vitals and pain scores, last 48 h intake and output, last 24 h labs and trends, and last 24 h imaging results.  This care required moderate level of medical decision making.    Marjorie Carlyon Favre, [facility] 07/19/2024, 1:33 PM Please see Amion for pager number during day hours 7:00am-4:30pm

## 2024-07-19 NOTE — Progress Notes (Signed)
 PROGRESS NOTE    Pamalee Marcoe  FMW:[id number] DOB: [birth year] DOA: 07/18/2024 PCP: Cleotilde Planas, MD    Brief Narrative:  This 73 yrs old female with PMH significant for GERD, peripheral vascular disease, perforated colon in March status post exploratory laparotomy and colorrhaphy on 12/07/2023 presented to the hospital with abdominal pain.  Patient also reports brownish vaginal discharge with intermittent bleeding which has been going on for months.  She is following with urologist for some time.  CT A&P showed sigmoid diverticulitis with tract extending inferiorly along the posterior inferior bladder and small abscess posterior to the bladder with 11 mm lesion in the lateral interpolar right kidney.  Patient was admitted for further evaluation . General Surgery and interventional radiology is consulted.  Patient underwent successful aspiration of the collection by IR.  Patient continued on IV antibiotics.  Assessment & Plan:   Principal Problem:   Abdominal pain   Right lower abdominal pain: Intra-abdominal abscess; Patient is status post exploratory laparotomy and colectomy in the past for perforated colon.   CTA/P showed collection in the pelvis.   Patient underwent IR guided sampling /aspiration of the collection 07/19/24 . General surgery and IR continue to follow.  Follow up cultures Continue IV Zosyn  for intra-abdominal infection. Continue adequate pain control with oxycodone .   Vaginal bleeding: She reports Intermittent vaginal bleeding with brownish discharge at times.   It is going on for several months now.  Has had follow-up with urology as outpatient with Dr. Cam, [facility].  Conservative treatment underway at this time.   Patient states that urology had possibility that it might be bleeding from the vaginal cuff area from previous site of surgery.   Pyuria:  Asymptomatic.   Denies any dysuria urgency frequency.  Patient has been started on Zosyn .      History of migraine headache: She takes Excedrin  and amitriptyline  at home.  Resumed   Hyperlipidemia: Continue Lipitor.   History of PVCs: Continue metoprolol .   DVT prophylaxis: SCDs Code Status: Full code Family Communication: No family at bed side Disposition Plan:    Status is: Inpatient Remains inpatient appropriate because: Severity of illness    Consultants:  General surgery IR  Procedures: Status post aspiration of the abscess by IR Antimicrobials:  Anti-infectives (From admission, onward)    Start     Dose/Rate Route Frequency Ordered Stop   07/19/24 0000  piperacillin -tazobactam (ZOSYN ) IVPB 3.375 g        3.375 g 12.5 mL/hr over 240 Minutes Intravenous Every 8 hours 07/18/24 1617     07/18/24 1545  piperacillin -tazobactam (ZOSYN ) IVPB 3.375 g        3.375 g 100 mL/hr over 30 Minutes Intravenous  Once 07/18/24 1541 07/18/24 1656       Subjective: Patient was seen and examined at bedside.  Overnight events noted. Patient reports feeling better,  She is status post drainage of the abscess by IR today,  tolerated well.   She still reports having slight pain.  Objective: Vitals:   07/19/24 0935 07/19/24 0940 07/19/24 0945 07/19/24 1004  BP: 124/79 123/74 114/75 (!) 125/90  Pulse: 90 93 98 80  Resp: 15 (!) 21 19 17   Temp:    98.1 F (36.7 C)  TempSrc:    Oral  SpO2: 93% 95% 100% 90%  Weight:      Height:        Intake/Output Summary (Last 24 hours) at 07/19/2024 1207 Last data filed at 07/19/2024 0407 Gross per  24 hour  Intake 897.86 ml  Output --  Net 897.86 ml   Filed Weights   07/18/24 1000 07/19/24 0500  Weight: 68.9 kg 69.9 kg    Examination:  General exam: Appears calm and comfortable, not in any acute distress. Respiratory system: CTA Bilaterally. Respiratory effort normal.  RR 14 Cardiovascular system: S1 & S2 heard, RRR. No JVD, murmurs, rubs, gallops or clicks.  Gastrointestinal system: Abdomen is non distended, soft and non  tender. Normal bowel sounds heard. Central nervous system: Alert and oriented x 3. No focal neurological deficits. Extremities: No edema, no cyanosis, no clubbing. Skin: No rashes, lesions or ulcers Psychiatry: Judgement and insight appear normal. Mood & affect appropriate.     Data Reviewed: I have personally reviewed following labs and imaging studies  CBC: Recent Labs  Lab 07/18/24 1005 07/19/24 0432  WBC 7.6 6.9  HGB 11.6* 10.8*  HCT 36.2 33.2*  MCV 82.8 81.6  PLT 247 220   Basic Metabolic Panel: Recent Labs  Lab 07/18/24 1005 07/19/24 0432  NA 139 140  K 3.8 3.7  CL 107 105  CO2 23 25  GLUCOSE 107* 99  BUN 17 12  CREATININE 0.61 0.70  CALCIUM  8.7* 8.5*  MG  --  2.0   GFR: Estimated Creatinine Clearance: 54.7 mL/min (by C-G formula based on SCr of 0.7 mg/dL). Liver Function Tests: Recent Labs  Lab 07/18/24 1005 07/19/24 0432  AST 18 16  ALT 16 13  ALKPHOS 76 63  BILITOT 0.5 0.6  PROT 6.1* 5.4*  ALBUMIN  3.2* 2.7*   Recent Labs  Lab 07/18/24 1005  LIPASE 38   No results for input(s): AMMONIA in the last 168 hours. Coagulation Profile: Recent Labs  Lab 07/19/24 0432  INR 1.0   Cardiac Enzymes: No results for input(s): CKTOTAL, CKMB, CKMBINDEX, TROPONINI in the last 168 hours. BNP (last 3 results) No results for input(s): PROBNP in the last 8760 hours. HbA1C: No results for input(s): HGBA1C in the last 72 hours. CBG: No results for input(s): GLUCAP in the last 168 hours. Lipid Profile: No results for input(s): CHOL, HDL, LDLCALC, TRIG, CHOLHDL, LDLDIRECT in the last 72 hours. Thyroid  Function Tests: No results for input(s): TSH, T4TOTAL, FREET4, T3FREE, THYROIDAB in the last 72 hours. Anemia Panel: No results for input(s): VITAMINB12, FOLATE, FERRITIN, TIBC, IRON, RETICCTPCT in the last 72 hours. Sepsis Labs: No results for input(s): PROCALCITON, LATICACIDVEN in the last 168  hours.  Recent Results (from the past 240 hours)  Blood culture (routine x 2)     Status: None (Preliminary result)   Collection Time: 07/18/24  4:05 PM   Specimen: BLOOD  Result Value Ref Range Status   Specimen Description BLOOD SITE NOT SPECIFIED  Final   Special Requests   Final    BOTTLES DRAWN AEROBIC AND ANAEROBIC Blood Culture adequate volume   Culture   Final    NO GROWTH < 24 HOURS Performed at [facility], [address], [place], [place] [address]    Report Status PENDING  Incomplete  Blood culture (routine x 2)     Status: None (Preliminary result)   Collection Time: 07/18/24  4:09 PM   Specimen: BLOOD RIGHT HAND  Result Value Ref Range Status   Specimen Description BLOOD RIGHT HAND  Final   Special Requests   Final    BOTTLES DRAWN AEROBIC AND ANAEROBIC Blood Culture results may not be optimal due to an inadequate volume of blood received in culture bottles  Culture   Final    NO GROWTH < 24 HOURS Performed at [facility], [address] [place], [place] [address]    Report Status PENDING  Incomplete    Radiology Studies: CT ABDOMEN PELVIS W CONTRAST Result Date: 07/18/2024 CLINICAL DATA:  Right lower quadrant pain, abnormal pelvic MRI. EXAM: CT ABDOMEN AND PELVIS WITH CONTRAST TECHNIQUE: Multidetector CT imaging of the abdomen and pelvis was performed using the standard protocol following bolus administration of intravenous contrast. RADIATION DOSE REDUCTION: This exam was performed according to the departmental dose-optimization program which includes automated exposure control, adjustment of the mA and/or kV according to patient size and/or use of iterative reconstruction technique. CONTRAST:  75mL OMNIPAQUE  IOHEXOL  350 MG/ML SOLN COMPARISON:  Pelvic MRI 07/13/2024 and CT abdomen pelvis 12/07/2023. FINDINGS: Lower chest: No acute findings. Heart is at the upper limits of normal in size. No pericardial or pleural effusion. Distal esophagus is grossly  unremarkable. Hepatobiliary: Liver is unremarkable. Cholecystectomy with associated biliary ductal dilatation. Pancreas: Negative. Spleen: Negative. Adrenals/Urinary Tract: Adrenal glands are unremarkable. 11 mm lesion in the lateral interpolar right kidney measures 74 Hounsfield units. 6.7 cm cyst off the lower pole right kidney. Subcentimeter low-attenuation lesion in the lower pole left kidney, too small to characterize. Kidneys are otherwise unremarkable. Ureters are decompressed. There may be posterior bladder wall thickening secondary to an adjacent inflammatory process to be discussed below. Stomach/Bowel: Tiny hiatal hernia. Stomach, small bowel, appendix and majority of the colon are unremarkable. Sigmoid diverticulosis with very mild adjacent haziness and stranding, best seen on coronal imaging. There is a tract that extends from the sigmoid colon, inferiorly to a thick-walled fluid collection along the right paramidline posterior bladder, measuring approximately 2.5 x 2.5 cm (coronal image 94). Associated thickening of the posterior bladder wall with possible extension along the ventral pelvic floor (2/60 7-71). Rectal anastomosis. Vascular/Lymphatic: Vascular structures are unremarkable. No pathologically enlarged lymph nodes. Reproductive: No adnexal Mass. Other: No free fluid. Mesenteries and peritoneum are otherwise unremarkable. Musculoskeletal: Degenerative changes in the spine. Levoconvex scoliosis. Unilateral pars defect at L5 with grade 2 anterolisthesis. IMPRESSION: 1. Residua of sigmoid diverticulitis with a tract extending inferiorly along the posteroinferior bladder and a small abscess posterior to the bladder. 2. 11 mm lesion in the lateral interpolar right kidney measures 74 Hounsfield units. Lesion is too small to properly characterize. Further evaluation with pre and post contrast MRI should be considered. Pre and post contrast CT could alternatively be performed, but would likely be of  decreased accuracy given lesion size. Electronically Signed   By: Newell Eke M.D.   On: 07/18/2024 14:09    Scheduled Meds:  amitriptyline   10 mg Oral QHS   atorvastatin   10 mg Oral q AM   calcium -vitamin D   1 tablet Oral Q breakfast   metoprolol  tartrate  25 mg Oral BID   sodium chloride  flush  3 mL Intravenous Q12H   Continuous Infusions:  sodium chloride  40 mL/hr at 07/18/24 1710   sodium chloride      piperacillin -tazobactam (ZOSYN )  IV 3.375 g (07/19/24 1012)     LOS: 1 day    Time spent: 50 mins    Darcel Dawley, MD [facility]   If 7PM-7AM, please contact night-coverage

## 2024-07-20 DIAGNOSIS — K651 Peritoneal abscess: Secondary | ICD-10-CM

## 2024-07-20 LAB — CBC
HCT: 33.1 % — ABNORMAL LOW (ref 36.0–46.0)
Hemoglobin: 10.7 g/dL — ABNORMAL LOW (ref 12.0–15.0)
MCH: 26.5 pg (ref 26.0–34.0)
MCHC: 32.3 g/dL (ref 30.0–36.0)
MCV: 81.9 fL (ref 80.0–100.0)
Platelets: 237 K/uL (ref 150–400)
RBC: 4.04 MIL/uL (ref 3.87–5.11)
RDW: 14.1 % (ref 11.5–15.5)
WBC: 8.2 K/uL (ref 4.0–10.5)
nRBC: 0 % (ref 0.0–0.2)

## 2024-07-20 LAB — BASIC METABOLIC PANEL WITH GFR
Anion gap: 9 (ref 5–15)
BUN: 13 mg/dL (ref 8–23)
CO2: 27 mmol/L (ref 22–32)
Calcium: 9.4 mg/dL (ref 8.9–10.3)
Chloride: 102 mmol/L (ref 98–111)
Creatinine, Ser: 0.79 mg/dL (ref 0.44–1.00)
GFR, Estimated: >60 mL/min (ref >60)
Glucose, Bld: 99 mg/dL (ref 70–99)
Potassium: 3.8 mmol/L (ref 3.5–5.1)
Sodium: 138 mmol/L (ref 135–145)

## 2024-07-20 LAB — PHOSPHORUS: Phosphorus: 5.4 mg/dL — ABNORMAL HIGH (ref 2.5–4.6)

## 2024-07-20 LAB — MAGNESIUM: Magnesium: 1.9 mg/dL (ref 1.7–2.4)

## 2024-07-20 NOTE — Progress Notes (Signed)
 PROGRESS NOTE    Meghan Avila  FMW:[id number] DOB: 10-Dec-1950 DOA: 07/18/2024 PCP: Cleotilde Planas, MD    Brief Narrative:  This 73 yrs old female with PMH significant for GERD, peripheral vascular disease, perforated colon in March status post exploratory laparotomy and colorrhaphy on 12/07/2023 presented to the hospital with abdominal pain.  Patient also reports brownish vaginal discharge with intermittent bleeding which has been going on for months.  She is following with urologist for some time.  CT A&P showed sigmoid diverticulitis with tract extending inferiorly along the posterior inferior bladder and small abscess posterior to the bladder with 11 mm lesion in the lateral interpolar right kidney.  Patient was admitted for further evaluation . General Surgery and interventional radiology is consulted.  Patient underwent successful aspiration of the collection by IR.  Patient continued on IV antibiotics.  Assessment & Plan:    Right lower abdominal pain: Intra-abdominal abscess; Patient is status post exploratory laparotomy and colectomy in the past for perforated colon.   CTA/P showed collection in the pelvis.   Patient underwent IR guided sampling /aspiration of the collection 07/19/24 .--Culture pending General surgery and IR: Recommend at least a 14 day course of antibiotics at discharge. Will be arranging follow up with colorectal surgeon in outpatient setting for her.   Continue IV Zosyn  for intra-abdominal infection. - pain control with oxycodone . -Per urology: I favor culture directed abx for several weeks and repeat CT scan. Would consider transvaginal approach to draining infection if conservative measures are unsuccessful.    Vaginal bleeding: She reports Intermittent vaginal bleeding with brownish discharge at times.   It is going on for several months now.  Has had follow-up with urology as outpatient with Dr. Cam, [facility] as well as gynecology.   Conservative treatment underway at this time.      Pyuria:  Asymptomatic.   Denies any dysuria urgency frequency.  Patient has been started on Zosyn .     History of migraine headache: She takes Excedrin  and amitriptyline  at home.  Resumed   Hyperlipidemia: Continue Lipitor.   History of PVCs: Continue metoprolol .   DVT prophylaxis: SCDs Code Status: Full code Family Communication: No family at bed side Disposition Plan:       Consultants:  General surgery IR Urology: I favor culture directed abx for several weeks and repeat CT scan. Would consider transvaginal approach to draining infection if conservative measures are unsuccessful.    Subjective: Eating well   Objective: Vitals:   07/19/24 2119 07/20/24 0500 07/20/24 0537 07/20/24 1000  BP: 122/69  118/65 113/78  Pulse: 68  62 63  Resp:    18  Temp:   98.1 F (36.7 C) 97.7 F (36.5 C)  TempSrc:   Oral Oral  SpO2:   96% 95%  Weight:  69.9 kg    Height:        Intake/Output Summary (Last 24 hours) at 07/20/2024 1215 Last data filed at 07/20/2024 0900 Gross per 24 hour  Intake 807.73 ml  Output --  Net 807.73 ml   Filed Weights   07/18/24 1000 07/19/24 0500 07/20/24 0500  Weight: 68.9 kg 69.9 kg 69.9 kg    Examination:   General: Appearance:    Obese female in no acute distress     Lungs:    respirations unlabored  Heart:    Normal heart rate.    MS:   All extremities are intact.   Neurologic:   Awake, alert  Data Reviewed: I have personally reviewed following labs and imaging studies  CBC: Recent Labs  Lab 07/18/24 1005 07/19/24 0432 07/20/24 0508  WBC 7.6 6.9 8.2  HGB 11.6* 10.8* 10.7*  HCT 36.2 33.2* 33.1*  MCV 82.8 81.6 81.9  PLT 247 220 237   Basic Metabolic Panel: Recent Labs  Lab 07/18/24 1005 07/19/24 0432 07/20/24 0508  NA 139 140 138  K 3.8 3.7 3.8  CL 107 105 102  CO2 23 25 27   GLUCOSE 107* 99 99  BUN 17 12 13   CREATININE 0.61 0.70 0.79  CALCIUM  8.7* 8.5*  9.4  MG  --  2.0 1.9  PHOS  --   --  5.4*   GFR: Estimated Creatinine Clearance: 54.7 mL/min (by C-G formula based on SCr of 0.79 mg/dL). Liver Function Tests: Recent Labs  Lab 07/18/24 1005 07/19/24 0432  AST 18 16  ALT 16 13  ALKPHOS 76 63  BILITOT 0.5 0.6  PROT 6.1* 5.4*  ALBUMIN  3.2* 2.7*   Recent Labs  Lab 07/18/24 1005  LIPASE 38   No results for input(s): AMMONIA in the last 168 hours. Coagulation Profile: Recent Labs  Lab 07/19/24 0432  INR 1.0   Cardiac Enzymes: No results for input(s): CKTOTAL, CKMB, CKMBINDEX, TROPONINI in the last 168 hours. BNP (last 3 results) No results for input(s): PROBNP in the last 8760 hours. HbA1C: No results for input(s): HGBA1C in the last 72 hours. CBG: No results for input(s): GLUCAP in the last 168 hours. Lipid Profile: No results for input(s): CHOL, HDL, LDLCALC, TRIG, CHOLHDL, LDLDIRECT in the last 72 hours. Thyroid  Function Tests: No results for input(s): TSH, T4TOTAL, FREET4, T3FREE, THYROIDAB in the last 72 hours. Anemia Panel: No results for input(s): VITAMINB12, FOLATE, FERRITIN, TIBC, IRON, RETICCTPCT in the last 72 hours. Sepsis Labs: No results for input(s): PROCALCITON, LATICACIDVEN in the last 168 hours.  Recent Results (from the past 240 hours)  Blood culture (routine x 2)     Status: None (Preliminary result)   Collection Time: 07/18/24  4:05 PM   Specimen: BLOOD  Result Value Ref Range Status   Specimen Description BLOOD SITE NOT SPECIFIED  Final   Special Requests   Final    BOTTLES DRAWN AEROBIC AND ANAEROBIC Blood Culture adequate volume   Culture   Final    NO GROWTH 2 DAYS Performed at [facility], [address] [place], [place] [address]    Report Status PENDING  Incomplete  Blood culture (routine x 2)     Status: None (Preliminary result)   Collection Time: 07/18/24  4:09 PM   Specimen: BLOOD RIGHT HAND  Result Value Ref Range  Status   Specimen Description BLOOD RIGHT HAND  Final   Special Requests   Final    BOTTLES DRAWN AEROBIC AND ANAEROBIC Blood Culture results may not be optimal due to an inadequate volume of blood received in culture bottles   Culture   Final    NO GROWTH 2 DAYS Performed at [facility], [address], [place], [place] [address]    Report Status PENDING  Incomplete  Aerobic/Anaerobic Culture w Gram Stain (surgical/deep wound)     Status: None (Preliminary result)   Collection Time: 07/19/24  9:09 AM   Specimen: Abscess  Result Value Ref Range Status   Specimen Description ABSCESS  Final   Special Requests NONE  Final   Gram Stain   Final    ABUNDANT WBC PRESENT, PREDOMINANTLY PMN  NO ORGANISMS SEEN    Culture   Final    NO GROWTH < 24 HOURS Performed at [facility], [address] [place], [place] [address]    Report Status PENDING  Incomplete    Radiology Studies: CT ABDOMEN PELVIS W CONTRAST Result Date: 07/19/2024 EXAM: CT ABDOMEN AND PELVIS WITH CONTRAST 07/19/2024 08:48:35 PM TECHNIQUE: CT of the abdomen and pelvis was performed with the administration of 75 mL of intravenous iohexol  (OMNIPAQUE ) 350 mg/mL. Following initial imaging, the patient was brought back for administration of 50 mL of rectal iohexol  (OMNIPAQUE ) 300 mg/mL, and a second set of images was obtained without additional intravenous contrast. Post-void imaging was performed with the use of gravity to help evacuation. A final post-evacuation picture was obtained after the patient used the bathroom. Multiplanar reformatted images are provided for review. Automated exposure control, iterative reconstruction, and/or weight-based adjustment of the mA/kV was utilized to reduce the radiation dose to as low as reasonably achievable. COMPARISON: None available. CLINICAL HISTORY: Evaluate for possible fistula. 75 mL omni 350 (IV); 50 mL omni 300 (rectal); CT ABDOMEN PELVIS W CONTRAST; Evaluate for possible  fistula; Pt was scanned with IV contrast, then brought back down for rectal contrast, due to concern of fistula. Spoke with Dr. Krisman who said second set of pictures are ok without IV contrast; Pt was scanned after the insertion of rectal tubing and rectal contrast. Post void was included with the use of gravity to help the evacuation process (placed bag on the ground and allowed to drain), after post void scan found a decent amount of contrast to still be within the intestines. Pt was unable to relax fully due to concern of having an accident, offered the pt the opportunity to use the bathroom and then return to the room for a final post evacuation picture. Evaluate for possible fistula. FINDINGS: LOWER CHEST: No acute abnormality. LIVER: The liver is unremarkable. GALLBLADDER AND BILE DUCTS: Status post cholecystectomy. No biliary ductal dilatation. SPLEEN: No acute abnormality. PANCREAS: No acute abnormality. ADRENAL GLANDS: No acute abnormality. KIDNEYS, URETERS AND BLADDER: Small bilateral renal cysts. Dominant 6.9 cm simple right lower pole renal cyst, benign. Per consensus, no follow-up is needed for simple Bosniak type 1 and 2 renal cysts, unless the patient has a malignancy history or risk factors. No stones in the kidneys or ureters. No hydronephrosis. No perinephric or periureteral stranding. Bladder is underdistended and poorly evaluated on CT. No intraluminal gas within the bladder to suggest a patent fistula. GI AND BOWEL: Stomach demonstrates no acute abnormality. Sigmoid diverticulosis without acute superimposed inflammatory changes to suggest diverticulitis. Suspected fistulous communication between the sigmoid colon and the posterior aspect of the bladder / vaginal apex (sagittal image 93) with residual 1.5 x 2.1 cm abscess (image 64). This collection is favored to be more closely related to the vaginal apex rather than the bladder, but there is associated posterior bladder wall thickening on  prior MR. Following rectal contrast administration, there is no spillage of contrast into the collection to confirm fistula patency. Normal appendix (image 48). There is no bowel obstruction. PERITONEUM AND RETROPERITONEUM: No ascites. No free air. VASCULATURE: Aorta is normal in caliber. LYMPH NODES: No lymphadenopathy. REPRODUCTIVE ORGANS: Status post hysterectomy. See above for findings associated with the vaginal apex. BONES AND SOFT TISSUES: Grade 2 spondylolisthesis at L5-S1. Degenerative changes of the visualized thoracolumbar spine. No acute osseous abnormality. No focal soft tissue abnormality. IMPRESSION: 1. Suspected fistulous communication between the sigmoid colon and  posterior bladder/vaginal apex with a residual 1.5 x 2.1 cm abscess. This is favored to be more closely associated with the vaginal apex, noting posterior bladder wall thickening on prior MR. 2. No findings to confirm fistula patency. 3. Sigmoid diverticulosis, without evidence of acute diverticulitis. Electronically signed by: Pinkie Pebbles MD 07/19/2024 09:18 PM EDT RP Workstation: [id number]   CT GUIDED NEEDLE PLACEMENT Result Date: 07/19/2024 INDICATION: [id number] Intra-abdominal fluid collection [id number]. DIVERTICULAR ABSCESS. EXAM: CT-GUIDED DIVERTICULAR ABSCESS ASPIRATION COMPARISON:  CT AP, 07/18/2024. MEDICATIONS: The patient is currently admitted to the hospital and receiving intravenous antibiotics. The antibiotics were administered within an appropriate time frame prior to the initiation of the procedure. ANESTHESIA/SEDATION: Moderate (conscious) sedation was employed during this procedure. A total of Versed  2 mg and Fentanyl  100 mcg was administered intravenously. Moderate Sedation Time: 17 minutes. The patient's level of consciousness and vital signs were monitored continuously by radiology nursing throughout the procedure under my direct supervision. CONTRAST:  None FLUOROSCOPY TIME:  CT dose; 384 mGycm COMPLICATIONS: None  immediate. PROCEDURE: RADIATION DOSE REDUCTION: This exam was performed according to the departmental dose-optimization program which includes automated exposure control, adjustment of the mA and/or kV according to patient size and/or use of iterative reconstruction technique. Informed written consent was obtained from the patient after a discussion of the risks, benefits and alternatives to treatment. The patient was placed prone on the CT gantry and a pre procedural CT was performed re-demonstrating the small abscess/fluid collection within the deep pelvis. The procedure was planned. A timeout was performed prior to the initiation of the procedure. The RIGHT gluteus was prepped and draped in the usual sterile fashion. The overlying soft tissues were anesthetized with 1% lidocaine  with epinephrine . Appropriate trajectory was planned with the use of a 22 gauge spinal needle. An 18 gauge trocar needle was advanced into the abscess/fluid collection then aspiration was performed. 2 mL of serosanguineous fluid was aspirated. The needle was removed. A dressing was placed. The patient tolerated the procedure well without immediate post procedural complication. IMPRESSION: Successful CT-guided diagnostic aspiration of small pelvic diverticular abscess with aspiration of 2 mL of serosanguineous fluid. Samples were sent to the laboratory as requested by the ordering clinical team. Thom Hall, MD [facility] Electronically Signed   By: Thom Hall M.D.   On: 07/19/2024 13:00   CT ABDOMEN PELVIS W CONTRAST Result Date: 07/18/2024 CLINICAL DATA:  Right lower quadrant pain, abnormal pelvic MRI. EXAM: CT ABDOMEN AND PELVIS WITH CONTRAST TECHNIQUE: Multidetector CT imaging of the abdomen and pelvis was performed using the standard protocol following bolus administration of intravenous contrast. RADIATION DOSE REDUCTION: This exam was performed according to the  departmental dose-optimization program which includes automated exposure control, adjustment of the mA and/or kV according to patient size and/or use of iterative reconstruction technique. CONTRAST:  75mL OMNIPAQUE  IOHEXOL  350 MG/ML SOLN COMPARISON:  Pelvic MRI 07/13/2024 and CT abdomen pelvis 12/07/2023. FINDINGS: Lower chest: No acute findings. Heart is at the upper limits of normal in size. No pericardial or pleural effusion. Distal esophagus is grossly unremarkable. Hepatobiliary: Liver is unremarkable. Cholecystectomy with associated biliary ductal dilatation. Pancreas: Negative. Spleen: Negative. Adrenals/Urinary Tract: Adrenal glands are unremarkable. 11 mm lesion in the lateral interpolar right kidney measures 74 Hounsfield units. 6.7 cm cyst off the lower pole right kidney. Subcentimeter low-attenuation lesion in the lower pole left kidney, too small to characterize. Kidneys are otherwise unremarkable. Ureters are decompressed. There may be posterior bladder wall thickening secondary  to an adjacent inflammatory process to be discussed below. Stomach/Bowel: Tiny hiatal hernia. Stomach, small bowel, appendix and majority of the colon are unremarkable. Sigmoid diverticulosis with very mild adjacent haziness and stranding, best seen on coronal imaging. There is a tract that extends from the sigmoid colon, inferiorly to a thick-walled fluid collection along the right paramidline posterior bladder, measuring approximately 2.5 x 2.5 cm (coronal image 94). Associated thickening of the posterior bladder wall with possible extension along the ventral pelvic floor (2/60 7-71). Rectal anastomosis. Vascular/Lymphatic: Vascular structures are unremarkable. No pathologically enlarged lymph nodes. Reproductive: No adnexal Mass. Other: No free fluid. Mesenteries and peritoneum are otherwise unremarkable. Musculoskeletal: Degenerative changes in the spine. Levoconvex scoliosis. Unilateral pars defect at L5 with grade 2  anterolisthesis. IMPRESSION: 1. Residua of sigmoid diverticulitis with a tract extending inferiorly along the posteroinferior bladder and a small abscess posterior to the bladder. 2. 11 mm lesion in the lateral interpolar right kidney measures 74 Hounsfield units. Lesion is too small to properly characterize. Further evaluation with pre and post contrast MRI should be considered. Pre and post contrast CT could alternatively be performed, but would likely be of decreased accuracy given lesion size. Electronically Signed   By: Newell Eke M.D.   On: 07/18/2024 14:09    Scheduled Meds:  amitriptyline   10 mg Oral QHS   atorvastatin   10 mg Oral q AM   calcium -vitamin D   1 tablet Oral Q breakfast   metoprolol  tartrate  25 mg Oral BID   sodium chloride  flush  3 mL Intravenous Q12H   Continuous Infusions:  piperacillin -tazobactam (ZOSYN )  IV 3.375 g (07/20/24 0923)     LOS: 2 days    Time spent: 50 mins    Harlene RAYMOND Bowl, DO [facility]   If 7PM-7AM, please contact night-coverage

## 2024-07-20 NOTE — Progress Notes (Signed)
   07/20/24 0912  Mobility  Activity Ambulated with assistance (In hallway)  Level of Assistance Modified independent, requires aide device or extra time  Assistive Device None  Distance Ambulated (ft) 280 ft  Activity Response Tolerated well  Mobility Referral Yes  Mobility visit 1 Mobility  Mobility Specialist Start Time (ACUTE ONLY) 0859  Mobility Specialist Stop Time (ACUTE ONLY) 0909  Mobility Specialist Time Calculation (min) (ACUTE ONLY) 10 min   Received pt in bed having no complaints and agreeable to mobility. Pt was asymptomatic throughout ambulation and returned to room w/o fault. Left in chair w/ call bell in reach and all needs met.

## 2024-07-20 NOTE — Plan of Care (Signed)
  Problem: Clinical Measurements: Goal: Ability to maintain clinical measurements within normal limits will improve Outcome: Progressing   Problem: Activity: Goal: Risk for activity intolerance will decrease Outcome: Progressing   Problem: Elimination: Goal: Will not experience complications related to bowel motility Outcome: Progressing

## 2024-07-20 NOTE — Plan of Care (Signed)
  Problem: Pain Managment: Goal: General experience of comfort will improve and/or be controlled Outcome: Progressing   Problem: Safety: Goal: Ability to remain free from injury will improve Outcome: Progressing

## 2024-07-20 NOTE — Progress Notes (Signed)
 Progress Note     Subjective: Patient denies pain. Reports that she has been tolerating soft diet well. Denies nausea and vomiting. Had small BM this morning with flatulence.  ROS  All negative with the exception of above.  Objective: Vital signs in last 24 hours: Temp:  [97.5 F (36.4 C)-98.1 F (36.7 C)] 98.1 F (36.7 C) (10/15 0537) Pulse Rate:  [62-80] 62 (10/15 0537) Resp:  [16-17] 16 (10/14 1632) BP: (115-125)/(65-90) 118/65 (10/15 0537) SpO2:  [90 %-97 %] 96 % (10/15 0537) Weight:  [69.9 kg] 69.9 kg (10/15 0500) Last BM Date : 07/19/24  Intake/Output from previous day: 10/14 0701 - 10/15 0700 In: 567.7 [P.O.:120; I.V.:396.3; IV Piggyback:51.5] Out: -  Intake/Output this shift: No intake/output data recorded.  PE: General: Pleasant female who is sitting in chair at bedside. HEENT: Head is normocephalic, atraumatic.  Heart: Pulse normal during encounter. Lungs: Respiratory effort nonlabored. Abd: Soft, NT, ND. Midline incision present. RLQ drain site scar also present. No rebound tenderness or guarding.  Psych: A&Ox3 with an appropriate affect.    Lab Results:  Recent Labs    07/19/24 0432 07/20/24 0508  WBC 6.9 8.2  HGB 10.8* 10.7*  HCT 33.2* 33.1*  PLT 220 237   BMET Recent Labs    07/19/24 0432 07/20/24 0508  NA 140 138  K 3.7 3.8  CL 105 102  CO2 25 27  GLUCOSE 99 99  BUN 12 13  CREATININE 0.70 0.79  CALCIUM  8.5* 9.4   PT/INR Recent Labs    07/19/24 0432  LABPROT 14.3  INR 1.0   CMP     Component Value Date/Time   NA 138 07/20/2024 0508   NA 144 11/12/2022 1140   K 3.8 07/20/2024 0508   CL 102 07/20/2024 0508   CO2 27 07/20/2024 0508   GLUCOSE 99 07/20/2024 0508   BUN 13 07/20/2024 0508   BUN 17 11/12/2022 1140   CREATININE 0.79 07/20/2024 0508   CALCIUM  9.4 07/20/2024 0508   PROT 5.4 (L) 07/19/2024 0432   PROT 6.0 11/12/2022 1140   ALBUMIN  2.7 (L) 07/19/2024 0432   ALBUMIN  4.4 11/12/2022 1140   AST 16 07/19/2024 0432    ALT 13 07/19/2024 0432   ALKPHOS 63 07/19/2024 0432   BILITOT 0.6 07/19/2024 0432   BILITOT 0.2 11/12/2022 1140   GFRNONAA >60 07/20/2024 0508   GFRAA >60 02/27/2018 0454   Lipase     Component Value Date/Time   LIPASE 38 07/18/2024 1005       Studies/Results: CT ABDOMEN PELVIS W CONTRAST Result Date: 07/19/2024 EXAM: CT ABDOMEN AND PELVIS WITH CONTRAST 07/19/2024 08:48:35 PM TECHNIQUE: CT of the abdomen and pelvis was performed with the administration of 75 mL of intravenous iohexol  (OMNIPAQUE ) 350 mg/mL. Following initial imaging, the patient was brought back for administration of 50 mL of rectal iohexol  (OMNIPAQUE ) 300 mg/mL, and a second set of images was obtained without additional intravenous contrast. Post-void imaging was performed with the use of gravity to help evacuation. A final post-evacuation picture was obtained after the patient used the bathroom. Multiplanar reformatted images are provided for review. Automated exposure control, iterative reconstruction, and/or weight-based adjustment of the mA/kV was utilized to reduce the radiation dose to as low as reasonably achievable. COMPARISON: None available. CLINICAL HISTORY: Evaluate for possible fistula. 75 mL omni 350 (IV); 50 mL omni 300 (rectal); CT ABDOMEN PELVIS W CONTRAST; Evaluate for possible fistula; Pt was scanned with IV contrast, then brought back down for rectal  contrast, due to concern of fistula. Spoke with Dr. Krisman who said second set of pictures are ok without IV contrast; Pt was scanned after the insertion of rectal tubing and rectal contrast. Post void was included with the use of gravity to help the evacuation process (placed bag on the ground and allowed to drain), after post void scan found a decent amount of contrast to still be within the intestines. Pt was unable to relax fully due to concern of having an accident, offered the pt the opportunity to use the bathroom and then return to the room for a final  post evacuation picture. Evaluate for possible fistula. FINDINGS: LOWER CHEST: No acute abnormality. LIVER: The liver is unremarkable. GALLBLADDER AND BILE DUCTS: Status post cholecystectomy. No biliary ductal dilatation. SPLEEN: No acute abnormality. PANCREAS: No acute abnormality. ADRENAL GLANDS: No acute abnormality. KIDNEYS, URETERS AND BLADDER: Small bilateral renal cysts. Dominant 6.9 cm simple right lower pole renal cyst, benign. Per consensus, no follow-up is needed for simple Bosniak type 1 and 2 renal cysts, unless the patient has a malignancy history or risk factors. No stones in the kidneys or ureters. No hydronephrosis. No perinephric or periureteral stranding. Bladder is underdistended and poorly evaluated on CT. No intraluminal gas within the bladder to suggest a patent fistula. GI AND BOWEL: Stomach demonstrates no acute abnormality. Sigmoid diverticulosis without acute superimposed inflammatory changes to suggest diverticulitis. Suspected fistulous communication between the sigmoid colon and the posterior aspect of the bladder / vaginal apex (sagittal image 93) with residual 1.5 x 2.1 cm abscess (image 64). This collection is favored to be more closely related to the vaginal apex rather than the bladder, but there is associated posterior bladder wall thickening on prior MR. Following rectal contrast administration, there is no spillage of contrast into the collection to confirm fistula patency. Normal appendix (image 48). There is no bowel obstruction. PERITONEUM AND RETROPERITONEUM: No ascites. No free air. VASCULATURE: Aorta is normal in caliber. LYMPH NODES: No lymphadenopathy. REPRODUCTIVE ORGANS: Status post hysterectomy. See above for findings associated with the vaginal apex. BONES AND SOFT TISSUES: Grade 2 spondylolisthesis at L5-S1. Degenerative changes of the visualized thoracolumbar spine. No acute osseous abnormality. No focal soft tissue abnormality. IMPRESSION: 1. Suspected fistulous  communication between the sigmoid colon and posterior bladder/vaginal apex with a residual 1.5 x 2.1 cm abscess. This is favored to be more closely associated with the vaginal apex, noting posterior bladder wall thickening on prior MR. 2. No findings to confirm fistula patency. 3. Sigmoid diverticulosis, without evidence of acute diverticulitis. Electronically signed by: Pinkie Pebbles MD 07/19/2024 09:18 PM EDT RP Workstation: [id number]   CT GUIDED NEEDLE PLACEMENT Result Date: 07/19/2024 INDICATION: [id number] Intra-abdominal fluid collection [id number]. DIVERTICULAR ABSCESS. EXAM: CT-GUIDED DIVERTICULAR ABSCESS ASPIRATION COMPARISON:  CT AP, 07/18/2024. MEDICATIONS: The patient is currently admitted to the hospital and receiving intravenous antibiotics. The antibiotics were administered within an appropriate time frame prior to the initiation of the procedure. ANESTHESIA/SEDATION: Moderate (conscious) sedation was employed during this procedure. A total of Versed  2 mg and Fentanyl  100 mcg was administered intravenously. Moderate Sedation Time: 17 minutes. The patient's level of consciousness and vital signs were monitored continuously by radiology nursing throughout the procedure under my direct supervision. CONTRAST:  None FLUOROSCOPY TIME:  CT dose; 384 mGycm COMPLICATIONS: None immediate. PROCEDURE: RADIATION DOSE REDUCTION: This exam was performed according to the departmental dose-optimization program which includes automated exposure control, adjustment of the mA and/or kV according to patient size and/or use of iterative  reconstruction technique. Informed written consent was obtained from the patient after a discussion of the risks, benefits and alternatives to treatment. The patient was placed prone on the CT gantry and a pre procedural CT was performed re-demonstrating the small abscess/fluid collection within the deep pelvis. The procedure was planned. A timeout was performed prior to the initiation of  the procedure. The RIGHT gluteus was prepped and draped in the usual sterile fashion. The overlying soft tissues were anesthetized with 1% lidocaine  with epinephrine . Appropriate trajectory was planned with the use of a 22 gauge spinal needle. An 18 gauge trocar needle was advanced into the abscess/fluid collection then aspiration was performed. 2 mL of serosanguineous fluid was aspirated. The needle was removed. A dressing was placed. The patient tolerated the procedure well without immediate post procedural complication. IMPRESSION: Successful CT-guided diagnostic aspiration of small pelvic diverticular abscess with aspiration of 2 mL of serosanguineous fluid. Samples were sent to the laboratory as requested by the ordering clinical team. Thom Hall, MD [facility] Electronically Signed   By: Thom Hall M.D.   On: 07/19/2024 13:00   CT ABDOMEN PELVIS W CONTRAST Result Date: 07/18/2024 CLINICAL DATA:  Right lower quadrant pain, abnormal pelvic MRI. EXAM: CT ABDOMEN AND PELVIS WITH CONTRAST TECHNIQUE: Multidetector CT imaging of the abdomen and pelvis was performed using the standard protocol following bolus administration of intravenous contrast. RADIATION DOSE REDUCTION: This exam was performed according to the departmental dose-optimization program which includes automated exposure control, adjustment of the mA and/or kV according to patient size and/or use of iterative reconstruction technique. CONTRAST:  75mL OMNIPAQUE  IOHEXOL  350 MG/ML SOLN COMPARISON:  Pelvic MRI 07/13/2024 and CT abdomen pelvis 12/07/2023. FINDINGS: Lower chest: No acute findings. Heart is at the upper limits of normal in size. No pericardial or pleural effusion. Distal esophagus is grossly unremarkable. Hepatobiliary: Liver is unremarkable. Cholecystectomy with associated biliary ductal dilatation. Pancreas: Negative. Spleen: Negative. Adrenals/Urinary Tract: Adrenal glands  are unremarkable. 11 mm lesion in the lateral interpolar right kidney measures 74 Hounsfield units. 6.7 cm cyst off the lower pole right kidney. Subcentimeter low-attenuation lesion in the lower pole left kidney, too small to characterize. Kidneys are otherwise unremarkable. Ureters are decompressed. There may be posterior bladder wall thickening secondary to an adjacent inflammatory process to be discussed below. Stomach/Bowel: Tiny hiatal hernia. Stomach, small bowel, appendix and majority of the colon are unremarkable. Sigmoid diverticulosis with very mild adjacent haziness and stranding, best seen on coronal imaging. There is a tract that extends from the sigmoid colon, inferiorly to a thick-walled fluid collection along the right paramidline posterior bladder, measuring approximately 2.5 x 2.5 cm (coronal image 94). Associated thickening of the posterior bladder wall with possible extension along the ventral pelvic floor (2/60 7-71). Rectal anastomosis. Vascular/Lymphatic: Vascular structures are unremarkable. No pathologically enlarged lymph nodes. Reproductive: No adnexal Mass. Other: No free fluid. Mesenteries and peritoneum are otherwise unremarkable. Musculoskeletal: Degenerative changes in the spine. Levoconvex scoliosis. Unilateral pars defect at L5 with grade 2 anterolisthesis. IMPRESSION: 1. Residua of sigmoid diverticulitis with a tract extending inferiorly along the posteroinferior bladder and a small abscess posterior to the bladder. 2. 11 mm lesion in the lateral interpolar right kidney measures 74 Hounsfield units. Lesion is too small to properly characterize. Further evaluation with pre and post contrast MRI should be considered. Pre and post contrast CT could alternatively be performed, but would likely be of decreased accuracy given lesion size. Electronically Signed   By: Newell  Blietz M.D.   On: 07/18/2024 14:09    Anti-infectives: Anti-infectives (From admission, onward)    Start      Dose/Rate Route Frequency Ordered Stop   07/19/24 0000  piperacillin -tazobactam (ZOSYN ) IVPB 3.375 g        3.375 g 12.5 mL/hr over 240 Minutes Intravenous Every 8 hours 07/18/24 1617     07/18/24 1545  piperacillin -tazobactam (ZOSYN ) IVPB 3.375 g        3.375 g 100 mL/hr over 30 Minutes Intravenous  Once 07/18/24 1541 07/18/24 1656        Assessment/Plan Pelvic fluid collection Hx of laparotomy and primary repair of sigmoid colon perforation 12/07/2023 - Afebrile. - WBC 8.2; HGB 10.7 - IR completed aspiration of abscess 10/14 and retrieved 2-3 mL of SS fluid that was sent for analysis. Culture and stain still pending. - Urology consulted and following. - No abdominal pain. No vomiting. Having bowel function. - Continue soft diet. - Recommend at least a 14 day course of antibiotics at discharge. Will be arranging follow up with colorectal surgeon in outpatient setting for her.    FEN: Soft; IVF per primary team VTE: Scds ID: [email or web address]     LOS: 2 days   I reviewed specialist notes, hospitalist notes, last 24 h vitals and pain scores, last 48 h intake and output, last 24 h labs and trends, and last 24 h imaging results.  This care required moderate level of medical decision making.    Marjorie Carlyon Favre, [facility] Surgery 07/20/2024, 9:55 AM Please see Amion for pager number during day hours 7:00am-4:30pm

## 2024-07-20 NOTE — Plan of Care (Signed)
   Problem: Education: Goal: Knowledge of General Education information will improve Description Including pain rating scale, medication(s)/side effects and non-pharmacologic comfort measures Outcome: Progressing   Problem: Health Behavior/Discharge Planning: Goal: Ability to manage health-related needs will improve Outcome: Progressing

## 2024-07-21 DIAGNOSIS — K651 Peritoneal abscess: Secondary | ICD-10-CM | POA: Diagnosis not present

## 2024-07-21 LAB — BASIC METABOLIC PANEL WITH GFR
Anion gap: 9 (ref 5–15)
BUN: 15 mg/dL (ref 8–23)
CO2: 28 mmol/L (ref 22–32)
Calcium: 8.7 mg/dL — ABNORMAL LOW (ref 8.9–10.3)
Chloride: 102 mmol/L (ref 98–111)
Creatinine, Ser: 0.88 mg/dL (ref 0.44–1.00)
GFR, Estimated: >60 mL/min
Glucose, Bld: 97 mg/dL (ref 70–99)
Potassium: 3.8 mmol/L (ref 3.5–5.1)
Sodium: 139 mmol/L (ref 135–145)

## 2024-07-21 LAB — CBC
HCT: 33.3 % — ABNORMAL LOW (ref 36.0–46.0)
Hemoglobin: 10.8 g/dL — ABNORMAL LOW (ref 12.0–15.0)
MCH: 26.5 pg (ref 26.0–34.0)
MCHC: 32.4 g/dL (ref 30.0–36.0)
MCV: 81.8 fL (ref 80.0–100.0)
Platelets: 236 K/uL (ref 150–400)
RBC: 4.07 MIL/uL (ref 3.87–5.11)
RDW: 13.9 % (ref 11.5–15.5)
WBC: 8.1 K/uL (ref 4.0–10.5)
nRBC: 0 % (ref 0.0–0.2)

## 2024-07-21 MED ORDER — ORAL CARE MOUTH RINSE: 15.0000 mL | OROMUCOSAL | Status: DC | PRN

## 2024-07-21 MED ORDER — RISAQUAD PO CAPS
2.0000 | ORAL_CAPSULE | Freq: Every day | ORAL | Status: DC
  Administered 2024-07-21 – 2024-07-23 (×3): 2 via ORAL
  Filled 2024-07-21 (×3): qty 2

## 2024-07-21 NOTE — Progress Notes (Signed)
 Progress Note     Subjective: Patient denies pain. Tolerating soft diet without nausea or vomiting. Having bowel function and flatulence.   ROS  All negative with the exception of above.  Objective: Vital signs in last 24 hours: Temp:  [97.7 F (36.5 C)-98.4 F (36.9 C)] 98 F (36.7 C) (10/16 0606) Pulse Rate:  [63-85] 83 (10/16 0911) Resp:  [17-18] 17 (10/16 0911) BP: (113-127)/(75-79) 125/75 (10/16 0911) SpO2:  [95 %-99 %] 98 % (10/16 0911) Weight:  [73 kg] 73 kg (10/16 0500) Last BM Date : 07/21/24  Intake/Output from previous day: 10/15 0701 - 10/16 0700 In: 720 [P.O.:720] Out: -  Intake/Output this shift: Total I/O In: 400 [P.O.:400] Out: -   PE: General: Pleasant female who is in bed in NAD. HEENT: Head is normocephalic, atraumatic.  Heart: Pulse rate normal during encounter. Lungs: Respiratory effort nonlabored. Abd: Soft, NT, ND. No rebound tenderness or guarding.  Psych: A&Ox3 with an appropriate affect.    Lab Results:  Recent Labs    07/20/24 0508 07/21/24 0242  WBC 8.2 8.1  HGB 10.7* 10.8*  HCT 33.1* 33.3*  PLT 237 236   BMET Recent Labs    07/20/24 0508 07/21/24 0242  NA 138 139  K 3.8 3.8  CL 102 102  CO2 27 28  GLUCOSE 99 97  BUN 13 15  CREATININE 0.79 0.88  CALCIUM  9.4 8.7*   PT/INR Recent Labs    07/19/24 0432  LABPROT 14.3  INR 1.0   CMP     Component Value Date/Time   NA 139 07/21/2024 0242   NA 144 11/12/2022 1140   K 3.8 07/21/2024 0242   CL 102 07/21/2024 0242   CO2 28 07/21/2024 0242   GLUCOSE 97 07/21/2024 0242   BUN 15 07/21/2024 0242   BUN 17 11/12/2022 1140   CREATININE 0.88 07/21/2024 0242   CALCIUM  8.7 (L) 07/21/2024 0242   PROT 5.4 (L) 07/19/2024 0432   PROT 6.0 11/12/2022 1140   ALBUMIN  2.7 (L) 07/19/2024 0432   ALBUMIN  4.4 11/12/2022 1140   AST 16 07/19/2024 0432   ALT 13 07/19/2024 0432   ALKPHOS 63 07/19/2024 0432   BILITOT 0.6 07/19/2024 0432   BILITOT 0.2 11/12/2022 1140   GFRNONAA >60  07/21/2024 0242   GFRAA >60 02/27/2018 0454   Lipase     Component Value Date/Time   LIPASE 38 07/18/2024 1005       Studies/Results: CT ABDOMEN PELVIS W CONTRAST Result Date: 07/19/2024 EXAM: CT ABDOMEN AND PELVIS WITH CONTRAST 07/19/2024 08:48:35 PM TECHNIQUE: CT of the abdomen and pelvis was performed with the administration of 75 mL of intravenous iohexol  (OMNIPAQUE ) 350 mg/mL. Following initial imaging, the patient was brought back for administration of 50 mL of rectal iohexol  (OMNIPAQUE ) 300 mg/mL, and a second set of images was obtained without additional intravenous contrast. Post-void imaging was performed with the use of gravity to help evacuation. A final post-evacuation picture was obtained after the patient used the bathroom. Multiplanar reformatted images are provided for review. Automated exposure control, iterative reconstruction, and/or weight-based adjustment of the mA/kV was utilized to reduce the radiation dose to as low as reasonably achievable. COMPARISON: None available. CLINICAL HISTORY: Evaluate for possible fistula. 75 mL omni 350 (IV); 50 mL omni 300 (rectal); CT ABDOMEN PELVIS W CONTRAST; Evaluate for possible fistula; Pt was scanned with IV contrast, then brought back down for rectal contrast, due to concern of fistula. Spoke with Dr. Krisman who said second set  of pictures are ok without IV contrast; Pt was scanned after the insertion of rectal tubing and rectal contrast. Post void was included with the use of gravity to help the evacuation process (placed bag on the ground and allowed to drain), after post void scan found a decent amount of contrast to still be within the intestines. Pt was unable to relax fully due to concern of having an accident, offered the pt the opportunity to use the bathroom and then return to the room for a final post evacuation picture. Evaluate for possible fistula. FINDINGS: LOWER CHEST: No acute abnormality. LIVER: The liver is unremarkable.  GALLBLADDER AND BILE DUCTS: Status post cholecystectomy. No biliary ductal dilatation. SPLEEN: No acute abnormality. PANCREAS: No acute abnormality. ADRENAL GLANDS: No acute abnormality. KIDNEYS, URETERS AND BLADDER: Small bilateral renal cysts. Dominant 6.9 cm simple right lower pole renal cyst, benign. Per consensus, no follow-up is needed for simple Bosniak type 1 and 2 renal cysts, unless the patient has a malignancy history or risk factors. No stones in the kidneys or ureters. No hydronephrosis. No perinephric or periureteral stranding. Bladder is underdistended and poorly evaluated on CT. No intraluminal gas within the bladder to suggest a patent fistula. GI AND BOWEL: Stomach demonstrates no acute abnormality. Sigmoid diverticulosis without acute superimposed inflammatory changes to suggest diverticulitis. Suspected fistulous communication between the sigmoid colon and the posterior aspect of the bladder / vaginal apex (sagittal image 93) with residual 1.5 x 2.1 cm abscess (image 64). This collection is favored to be more closely related to the vaginal apex rather than the bladder, but there is associated posterior bladder wall thickening on prior MR. Following rectal contrast administration, there is no spillage of contrast into the collection to confirm fistula patency. Normal appendix (image 48). There is no bowel obstruction. PERITONEUM AND RETROPERITONEUM: No ascites. No free air. VASCULATURE: Aorta is normal in caliber. LYMPH NODES: No lymphadenopathy. REPRODUCTIVE ORGANS: Status post hysterectomy. See above for findings associated with the vaginal apex. BONES AND SOFT TISSUES: Grade 2 spondylolisthesis at L5-S1. Degenerative changes of the visualized thoracolumbar spine. No acute osseous abnormality. No focal soft tissue abnormality. IMPRESSION: 1. Suspected fistulous communication between the sigmoid colon and posterior bladder/vaginal apex with a residual 1.5 x 2.1 cm abscess. This is favored to be  more closely associated with the vaginal apex, noting posterior bladder wall thickening on prior MR. 2. No findings to confirm fistula patency. 3. Sigmoid diverticulosis, without evidence of acute diverticulitis. Electronically signed by: Pinkie Pebbles MD 07/19/2024 09:18 PM EDT RP Workstation: [id number]   CT GUIDED NEEDLE PLACEMENT Result Date: 07/19/2024 INDICATION: [id number] Intra-abdominal fluid collection [id number]. DIVERTICULAR ABSCESS. EXAM: CT-GUIDED DIVERTICULAR ABSCESS ASPIRATION COMPARISON:  CT AP, 07/18/2024. MEDICATIONS: The patient is currently admitted to the hospital and receiving intravenous antibiotics. The antibiotics were administered within an appropriate time frame prior to the initiation of the procedure. ANESTHESIA/SEDATION: Moderate (conscious) sedation was employed during this procedure. A total of Versed  2 mg and Fentanyl  100 mcg was administered intravenously. Moderate Sedation Time: 17 minutes. The patient's level of consciousness and vital signs were monitored continuously by radiology nursing throughout the procedure under my direct supervision. CONTRAST:  None FLUOROSCOPY TIME:  CT dose; 384 mGycm COMPLICATIONS: None immediate. PROCEDURE: RADIATION DOSE REDUCTION: This exam was performed according to the departmental dose-optimization program which includes automated exposure control, adjustment of the mA and/or kV according to patient size and/or use of iterative reconstruction technique. Informed written consent was obtained from the patient after a discussion of  the risks, benefits and alternatives to treatment. The patient was placed prone on the CT gantry and a pre procedural CT was performed re-demonstrating the small abscess/fluid collection within the deep pelvis. The procedure was planned. A timeout was performed prior to the initiation of the procedure. The RIGHT gluteus was prepped and draped in the usual sterile fashion. The overlying soft tissues were anesthetized with  1% lidocaine  with epinephrine . Appropriate trajectory was planned with the use of a 22 gauge spinal needle. An 18 gauge trocar needle was advanced into the abscess/fluid collection then aspiration was performed. 2 mL of serosanguineous fluid was aspirated. The needle was removed. A dressing was placed. The patient tolerated the procedure well without immediate post procedural complication. IMPRESSION: Successful CT-guided diagnostic aspiration of small pelvic diverticular abscess with aspiration of 2 mL of serosanguineous fluid. Samples were sent to the laboratory as requested by the ordering clinical team. Thom Hall, MD [facility] Radiology Electronically Signed   By: Thom Hall M.D.   On: 07/19/2024 13:00    Anti-infectives: Anti-infectives (From admission, onward)    Start     Dose/Rate Route Frequency Ordered Stop   07/19/24 0000  piperacillin -tazobactam (ZOSYN ) IVPB 3.375 g        3.375 g 12.5 mL/hr over 240 Minutes Intravenous Every 8 hours 07/18/24 1617     07/18/24 1545  piperacillin -tazobactam (ZOSYN ) IVPB 3.375 g        3.375 g 100 mL/hr over 30 Minutes Intravenous  Once 07/18/24 1541 07/18/24 1656        Assessment/Plan Pelvic fluid collection Hx of laparotomy and primary repair of sigmoid colon perforation 12/07/2023 - Afebrile. - WBC 8.1; HGB 10.8 - IR completed aspiration of abscess 10/14 and retrieved 2-3 mL of SS fluid that was sent for analysis. Culture and stain still pending. - Urology felt that she has a fistula to the colon or bladder. There is a question of abscess connection to the vagina. Favored abx and repeat CT in several weeks. - No abdominal pain. No vomiting. Having bowel function. - Continue soft diet and advance as tolerated. - Recommend at least a 14 day course of antibiotics at discharge. Follow up with colorectal surgeon in outpatient setting has been arranged for her.   FEN: Soft; IVF per primary  team VTE: Scds ID: [email or web address]     LOS: 3 days   I reviewed nursing notes, hospitalist notes, last 24 h vitals and pain scores, last 48 h intake and output, last 24 h labs and trends, and last 24 h imaging results.  This care required moderate level of medical decision making.    Marjorie Carlyon Favre, [facility] Surgery 07/21/2024, 9:51 AM Please see Amion for pager number during day hours 7:00am-4:30pm

## 2024-07-21 NOTE — Progress Notes (Signed)
 PROGRESS NOTE    Meghan Avila  FMW:[id number] DOB: 28-Aug-1951 DOA: 07/18/2024 PCP: Cleotilde Planas, MD    Brief Narrative:  This 73 yrs old female with PMH significant for GERD, peripheral vascular disease, perforated colon in March status post exploratory laparotomy and colorrhaphy on 12/07/2023 presented to the hospital with abdominal pain.  Patient also reports brownish vaginal discharge with intermittent bleeding which has been going on for months.  She is following with urologist for some time.  CT A&P showed sigmoid diverticulitis with tract extending inferiorly along the posterior inferior bladder and small abscess posterior to the bladder with 11 mm lesion in the lateral interpolar right kidney.  Patient was admitted for further evaluation . General Surgery and interventional radiology is consulted.  Patient underwent successful aspiration of the collection by IR.  Patient continued on IV antibiotics.   Assessment & Plan:    Right lower abdominal pain: Intra-abdominal abscess; Patient is status post exploratory laparotomy and colectomy in the past for perforated colon.   CTA/P showed collection in the pelvis.   Patient underwent IR guided sampling /aspiration of the collection 07/19/24 .--Culture pending General surgery and IR: Recommend at least a 14 day course of antibiotics at discharge. Will be arranging follow up with colorectal surgeon in outpatient setting for her.   Continue IV Zosyn  for intra-abdominal infection. - pain control with oxycodone . -Per urology: I favor culture directed abx for several weeks and repeat CT scan. Would consider transvaginal approach to draining infection if conservative measures are unsuccessful.    Vaginal bleeding: She reports Intermittent vaginal bleeding with brownish discharge at times.   It is going on for several months now.  Has had follow-up with urology as outpatient with Dr. Cam, [facility] as well as gynecology.   Conservative treatment underway at this time.      Pyuria:  Asymptomatic.   Denies any dysuria urgency frequency.  Patient has been started on Zosyn .     History of migraine headache: She takes Excedrin  and amitriptyline  at home.  Resumed   Hyperlipidemia: Continue Lipitor.   History of PVCs: Continue metoprolol .   DVT prophylaxis: SCDs Code Status: Full code Family Communication: No family at bed side Disposition Plan:       Consultants:  General surgery IR Urology: I favor culture directed abx for several weeks and repeat CT scan. Would consider transvaginal approach to draining infection if conservative measures are unsuccessful.    Subjective: Some loose stools this morning   Objective: Vitals:   07/20/24 2058 07/21/24 0500 07/21/24 0606 07/21/24 0911  BP: 122/75  123/79 125/75  Pulse: 81  68 83  Resp:    17  Temp: 98.4 F (36.9 C)  98 F (36.7 C)   TempSrc: Oral  Oral   SpO2: 98%  95% 98%  Weight:  73 kg    Height:        Intake/Output Summary (Last 24 hours) at 07/21/2024 1238 Last data filed at 07/21/2024 1200 Gross per 24 hour  Intake 1120 ml  Output --  Net 1120 ml   Filed Weights   07/19/24 0500 07/20/24 0500 07/21/24 0500  Weight: 69.9 kg 69.9 kg 73 kg    Examination:   General: Appearance:    Obese female in no acute distress     Lungs:    respirations unlabored  Heart:    Normal heart rate.    MS:   All extremities are intact.   Neurologic:   Awake, alert  Data Reviewed: I have personally reviewed following labs and imaging studies  CBC: Recent Labs  Lab 07/18/24 1005 07/19/24 0432 07/20/24 0508 07/21/24 0242  WBC 7.6 6.9 8.2 8.1  HGB 11.6* 10.8* 10.7* 10.8*  HCT 36.2 33.2* 33.1* 33.3*  MCV 82.8 81.6 81.9 81.8  PLT 247 220 237 236   Basic Metabolic Panel: Recent Labs  Lab 07/18/24 1005 07/19/24 0432 07/20/24 0508 07/21/24 0242  NA 139 140 138 139  K 3.8 3.7 3.8 3.8  CL 107 105 102 102  CO2 23 25 27 28    GLUCOSE 107* 99 99 97  BUN 17 12 13 15   CREATININE 0.61 0.70 0.79 0.88  CALCIUM  8.7* 8.5* 9.4 8.7*  MG  --  2.0 1.9  --   PHOS  --   --  5.4*  --    GFR: Estimated Creatinine Clearance: 50.8 mL/min (by C-G formula based on SCr of 0.88 mg/dL). Liver Function Tests: Recent Labs  Lab 07/18/24 1005 07/19/24 0432  AST 18 16  ALT 16 13  ALKPHOS 76 63  BILITOT 0.5 0.6  PROT 6.1* 5.4*  ALBUMIN  3.2* 2.7*   Recent Labs  Lab 07/18/24 1005  LIPASE 38   No results for input(s): AMMONIA in the last 168 hours. Coagulation Profile: Recent Labs  Lab 07/19/24 0432  INR 1.0   Cardiac Enzymes: No results for input(s): CKTOTAL, CKMB, CKMBINDEX, TROPONINI in the last 168 hours. BNP (last 3 results) No results for input(s): PROBNP in the last 8760 hours. HbA1C: No results for input(s): HGBA1C in the last 72 hours. CBG: No results for input(s): GLUCAP in the last 168 hours. Lipid Profile: No results for input(s): CHOL, HDL, LDLCALC, TRIG, CHOLHDL, LDLDIRECT in the last 72 hours. Thyroid  Function Tests: No results for input(s): TSH, T4TOTAL, FREET4, T3FREE, THYROIDAB in the last 72 hours. Anemia Panel: No results for input(s): VITAMINB12, FOLATE, FERRITIN, TIBC, IRON, RETICCTPCT in the last 72 hours. Sepsis Labs: No results for input(s): PROCALCITON, LATICACIDVEN in the last 168 hours.  Recent Results (from the past 240 hours)  Blood culture (routine x 2)     Status: None (Preliminary result)   Collection Time: 07/18/24  4:05 PM   Specimen: BLOOD  Result Value Ref Range Status   Specimen Description BLOOD SITE NOT SPECIFIED  Final   Special Requests   Final    BOTTLES DRAWN AEROBIC AND ANAEROBIC Blood Culture adequate volume   Culture   Final    NO GROWTH 3 DAYS Performed at [facility], [address] [place], [place] [address]    Report Status PENDING  Incomplete  Blood culture (routine x 2)     Status: None  (Preliminary result)   Collection Time: 07/18/24  4:09 PM   Specimen: BLOOD RIGHT HAND  Result Value Ref Range Status   Specimen Description BLOOD RIGHT HAND  Final   Special Requests   Final    BOTTLES DRAWN AEROBIC AND ANAEROBIC Blood Culture results may not be optimal due to an inadequate volume of blood received in culture bottles   Culture   Final    NO GROWTH 3 DAYS Performed at [facility], [address], [place], [place] [address]    Report Status PENDING  Incomplete  Aerobic/Anaerobic Culture w Gram Stain (surgical/deep wound)     Status: None (Preliminary result)   Collection Time: 07/19/24  9:09 AM   Specimen: Abscess  Result Value Ref Range Status   Specimen Description ABSCESS  Final   Special Requests NONE  Final   Gram Stain   Final    ABUNDANT WBC PRESENT, PREDOMINANTLY PMN NO ORGANISMS SEEN    Culture   Final    CULTURE REINCUBATED FOR BETTER GROWTH Performed at [facility], [address] [place], [place] [address]    Report Status PENDING  Incomplete    Radiology Studies: CT ABDOMEN PELVIS W CONTRAST Result Date: 07/19/2024 EXAM: CT ABDOMEN AND PELVIS WITH CONTRAST 07/19/2024 08:48:35 PM TECHNIQUE: CT of the abdomen and pelvis was performed with the administration of 75 mL of intravenous iohexol  (OMNIPAQUE ) 350 mg/mL. Following initial imaging, the patient was brought back for administration of 50 mL of rectal iohexol  (OMNIPAQUE ) 300 mg/mL, and a second set of images was obtained without additional intravenous contrast. Post-void imaging was performed with the use of gravity to help evacuation. A final post-evacuation picture was obtained after the patient used the bathroom. Multiplanar reformatted images are provided for review. Automated exposure control, iterative reconstruction, and/or weight-based adjustment of the mA/kV was utilized to reduce the radiation dose to as low as reasonably achievable. COMPARISON: None available. CLINICAL HISTORY:  Evaluate for possible fistula. 75 mL omni 350 (IV); 50 mL omni 300 (rectal); CT ABDOMEN PELVIS W CONTRAST; Evaluate for possible fistula; Pt was scanned with IV contrast, then brought back down for rectal contrast, due to concern of fistula. Spoke with Dr. Krisman who said second set of pictures are ok without IV contrast; Pt was scanned after the insertion of rectal tubing and rectal contrast. Post void was included with the use of gravity to help the evacuation process (placed bag on the ground and allowed to drain), after post void scan found a decent amount of contrast to still be within the intestines. Pt was unable to relax fully due to concern of having an accident, offered the pt the opportunity to use the bathroom and then return to the room for a final post evacuation picture. Evaluate for possible fistula. FINDINGS: LOWER CHEST: No acute abnormality. LIVER: The liver is unremarkable. GALLBLADDER AND BILE DUCTS: Status post cholecystectomy. No biliary ductal dilatation. SPLEEN: No acute abnormality. PANCREAS: No acute abnormality. ADRENAL GLANDS: No acute abnormality. KIDNEYS, URETERS AND BLADDER: Small bilateral renal cysts. Dominant 6.9 cm simple right lower pole renal cyst, benign. Per consensus, no follow-up is needed for simple Bosniak type 1 and 2 renal cysts, unless the patient has a malignancy history or risk factors. No stones in the kidneys or ureters. No hydronephrosis. No perinephric or periureteral stranding. Bladder is underdistended and poorly evaluated on CT. No intraluminal gas within the bladder to suggest a patent fistula. GI AND BOWEL: Stomach demonstrates no acute abnormality. Sigmoid diverticulosis without acute superimposed inflammatory changes to suggest diverticulitis. Suspected fistulous communication between the sigmoid colon and the posterior aspect of the bladder / vaginal apex (sagittal image 93) with residual 1.5 x 2.1 cm abscess (image 64). This collection is favored to be  more closely related to the vaginal apex rather than the bladder, but there is associated posterior bladder wall thickening on prior MR. Following rectal contrast administration, there is no spillage of contrast into the collection to confirm fistula patency. Normal appendix (image 48). There is no bowel obstruction. PERITONEUM AND RETROPERITONEUM: No ascites. No free air. VASCULATURE: Aorta is normal in caliber. LYMPH NODES: No lymphadenopathy. REPRODUCTIVE ORGANS: Status post hysterectomy. See above for findings associated with the vaginal apex. BONES AND SOFT TISSUES: Grade 2 spondylolisthesis at L5-S1. Degenerative changes of  the visualized thoracolumbar spine. No acute osseous abnormality. No focal soft tissue abnormality. IMPRESSION: 1. Suspected fistulous communication between the sigmoid colon and posterior bladder/vaginal apex with a residual 1.5 x 2.1 cm abscess. This is favored to be more closely associated with the vaginal apex, noting posterior bladder wall thickening on prior MR. 2. No findings to confirm fistula patency. 3. Sigmoid diverticulosis, without evidence of acute diverticulitis. Electronically signed by: Pinkie Pebbles MD 07/19/2024 09:18 PM EDT RP Workstation: [id number]    Scheduled Meds:  acidophilus  2 capsule Oral Daily   amitriptyline   10 mg Oral QHS   atorvastatin   10 mg Oral q AM   calcium -vitamin D   1 tablet Oral Q breakfast   metoprolol  tartrate  25 mg Oral BID   sodium chloride  flush  3 mL Intravenous Q12H   Continuous Infusions:  piperacillin -tazobactam (ZOSYN )  IV 3.375 g (07/21/24 0921)     LOS: 3 days    Time spent: 50 mins    Harlene RAYMOND Bowl, DO [facility]   If 7PM-7AM, please contact night-coverage

## 2024-07-21 NOTE — Progress Notes (Signed)
 Heart rate irregular, lopressor  given and Dr. Juvenal notified. Pt states that she does not have a hx of afib but did wear a halter monitor once during the past year.  Follow up was for one year with the cardiologist. Pt states she has had 2 episodes of diarrhea this am, eating her yougart this am.

## 2024-07-21 NOTE — Plan of Care (Signed)
 Pt has had 3 BMs today, probiotics started. Pt up and ambulating in the room and hall ,sitting up all day.

## 2024-07-21 NOTE — Progress Notes (Signed)
 Subjective: Pt up in chair. In good spirits, Covered CT findings and plan.   Objective: Vital signs in last 24 hours: Temp:  [97.9 F (36.6 C)-98.4 F (36.9 C)] 98 F (36.7 C) (10/16 0606) Pulse Rate:  [68-85] 83 (10/16 0911) Resp:  [17-18] 17 (10/16 0911) BP: (122-127)/(75-79) 125/75 (10/16 0911) SpO2:  [95 %-99 %] 98 % (10/16 0911) Weight:  [73 kg] 73 kg (10/16 0500)  Assessment/Plan: #pelvic fluid collection  CT A/P with rectal contrast concerning for communication between sigmoid colon and vaginal cuff.    Per Gen Surg, 2 weeks ABX and follow up with colorectal surgery.  Follow up with Urology as needed. Ok for discharge once medically cleared.   Intake/Output from previous day: 10/15 0701 - 10/16 0700 In: 720 [P.O.:720] Out: -   Intake/Output this shift: Total I/O In: 640 [P.O.:640] Out: -   Physical Exam:  General: Alert and oriented CV: No cyanosis Lungs: equal chest rise Abdomen: Soft, NTND, no rebound or guarding  Lab Results: Recent Labs    07/19/24 0432 07/20/24 0508 07/21/24 0242  HGB 10.8* 10.7* 10.8*  HCT 33.2* 33.1* 33.3*   BMET Recent Labs    07/20/24 0508 07/21/24 0242  NA 138 139  K 3.8 3.8  CL 102 102  CO2 27 28  GLUCOSE 99 97  BUN 13 15  CREATININE 0.79 0.88  CALCIUM  9.4 8.7*  HGB 10.7* 10.8*  WBC 8.2 8.1     Studies/Results: CT ABDOMEN PELVIS W CONTRAST Result Date: 07/19/2024 EXAM: CT ABDOMEN AND PELVIS WITH CONTRAST 07/19/2024 08:48:35 PM TECHNIQUE: CT of the abdomen and pelvis was performed with the administration of 75 mL of intravenous iohexol  (OMNIPAQUE ) 350 mg/mL. Following initial imaging, the patient was brought back for administration of 50 mL of rectal iohexol  (OMNIPAQUE ) 300 mg/mL, and a second set of images was obtained without additional intravenous contrast. Post-void imaging was performed with the use of gravity to help evacuation. A final post-evacuation picture was obtained after the patient used the  bathroom. Multiplanar reformatted images are provided for review. Automated exposure control, iterative reconstruction, and/or weight-based adjustment of the mA/kV was utilized to reduce the radiation dose to as low as reasonably achievable. COMPARISON: None available. CLINICAL HISTORY: Evaluate for possible fistula. 75 mL omni 350 (IV); 50 mL omni 300 (rectal); CT ABDOMEN PELVIS W CONTRAST; Evaluate for possible fistula; Pt was scanned with IV contrast, then brought back down for rectal contrast, due to concern of fistula. Spoke with Dr. Krisman who said second set of pictures are ok without IV contrast; Pt was scanned after the insertion of rectal tubing and rectal contrast. Post void was included with the use of gravity to help the evacuation process (placed bag on the ground and allowed to drain), after post void scan found a decent amount of contrast to still be within the intestines. Pt was unable to relax fully due to concern of having an accident, offered the pt the opportunity to use the bathroom and then return to the room for a final post evacuation picture. Evaluate for possible fistula. FINDINGS: LOWER CHEST: No acute abnormality. LIVER: The liver is unremarkable. GALLBLADDER AND BILE DUCTS: Status post cholecystectomy. No biliary ductal dilatation. SPLEEN: No acute abnormality. PANCREAS: No acute abnormality. ADRENAL GLANDS: No acute abnormality. KIDNEYS, URETERS AND BLADDER: Small bilateral renal cysts. Dominant 6.9 cm simple right lower pole renal cyst, benign. Per consensus, no follow-up is needed for simple Bosniak type 1 and 2 renal cysts, unless  the patient has a malignancy history or risk factors. No stones in the kidneys or ureters. No hydronephrosis. No perinephric or periureteral stranding. Bladder is underdistended and poorly evaluated on CT. No intraluminal gas within the bladder to suggest a patent fistula. GI AND BOWEL: Stomach demonstrates no acute abnormality. Sigmoid diverticulosis  without acute superimposed inflammatory changes to suggest diverticulitis. Suspected fistulous communication between the sigmoid colon and the posterior aspect of the bladder / vaginal apex (sagittal image 93) with residual 1.5 x 2.1 cm abscess (image 64). This collection is favored to be more closely related to the vaginal apex rather than the bladder, but there is associated posterior bladder wall thickening on prior MR. Following rectal contrast administration, there is no spillage of contrast into the collection to confirm fistula patency. Normal appendix (image 48). There is no bowel obstruction. PERITONEUM AND RETROPERITONEUM: No ascites. No free air. VASCULATURE: Aorta is normal in caliber. LYMPH NODES: No lymphadenopathy. REPRODUCTIVE ORGANS: Status post hysterectomy. See above for findings associated with the vaginal apex. BONES AND SOFT TISSUES: Grade 2 spondylolisthesis at L5-S1. Degenerative changes of the visualized thoracolumbar spine. No acute osseous abnormality. No focal soft tissue abnormality. IMPRESSION: 1. Suspected fistulous communication between the sigmoid colon and posterior bladder/vaginal apex with a residual 1.5 x 2.1 cm abscess. This is favored to be more closely associated with the vaginal apex, noting posterior bladder wall thickening on prior MR. 2. No findings to confirm fistula patency. 3. Sigmoid diverticulosis, without evidence of acute diverticulitis. Electronically signed by: Pinkie Pebbles MD 07/19/2024 09:18 PM EDT RP Workstation: [id number]      LOS: 3 days   Ole Bourdon, NP [facility] Pager: [phone]  07/21/2024, 1:43 PM

## 2024-07-22 DIAGNOSIS — K651 Peritoneal abscess: Secondary | ICD-10-CM | POA: Diagnosis not present

## 2024-07-22 NOTE — Plan of Care (Signed)
  Problem: Clinical Measurements: Goal: Ability to maintain clinical measurements within normal limits will improve Outcome: Progressing   Problem: Nutrition: Goal: Adequate nutrition will be maintained Outcome: Progressing   Problem: Activity: Goal: Risk for activity intolerance will decrease Outcome: Progressing   Problem: Elimination: Goal: Will not experience complications related to bowel motility Outcome: Progressing

## 2024-07-22 NOTE — Progress Notes (Signed)
 PROGRESS NOTE    Meghan Avila  FMW:[id number] DOB: [birth year] DOA: 07/18/2024 PCP: Cleotilde Planas, MD    Brief Narrative:  This 73 yrs old female with PMH significant for GERD, peripheral vascular disease, perforated colon in March status post exploratory laparotomy and colorrhaphy on 12/07/2023 presented to the hospital with abdominal pain.  Patient also reports brownish vaginal discharge with intermittent bleeding which has been going on for months.  She is following with urologist for some time.  CT A&P showed sigmoid diverticulitis with tract extending inferiorly along the posterior inferior bladder and small abscess posterior to the bladder with 11 mm lesion in the lateral interpolar right kidney.  Patient was admitted for further evaluation . General Surgery and interventional radiology is consulted.  Patient underwent successful aspiration of the collection by IR.  Patient continued on IV antibiotics.   Assessment & Plan:    Right lower abdominal pain: Intra-abdominal abscess; Patient is status post exploratory laparotomy and colectomy in the past for perforated colon.   CTA/P showed collection in the pelvis.   Patient underwent IR guided sampling /aspiration of the collection 07/19/24 .--Culture pending General surgery and IR: Recommend at least a 14 day course of antibiotics at discharge. Will be arranging follow up with colorectal surgeon in outpatient setting for her.   Continue IV Zosyn  for intra-abdominal infection. - pain control with oxycodone . -Per urology: I favor culture directed abx for several weeks and repeat CT scan. Would consider transvaginal approach to draining infection if conservative measures are unsuccessful.   - Awaiting final culture results suspect patient can go on Augmentin   Vaginal bleeding: She reports Intermittent vaginal bleeding with brownish discharge at times.   It is going on for several months now.  Has had follow-up with urology as  outpatient with Dr. Cam, [facility] as well as gynecology.  Conservative treatment underway at this time.      Pyuria:  Asymptomatic.   Denies any dysuria urgency frequency.  Patient has been started on Zosyn .     History of migraine headache: She takes Excedrin  and amitriptyline  at home.  Resumed   Hyperlipidemia: Continue Lipitor.   History of PVCs: Continue metoprolol .   DVT prophylaxis: SCDs Code Status: Full code Family Communication: No family at bed side Disposition Plan:       Consultants:  General surgery IR Urology: I favor culture directed abx for several weeks and repeat CT scan. Would consider transvaginal approach to draining infection if conservative measures are unsuccessful.    Subjective: Loose stools resolved, eating well   Objective: Vitals:   07/21/24 1651 07/21/24 2152 07/22/24 0604 07/22/24 1033  BP: (!) 122/95 116/74 123/66 135/71  Pulse: 75 84  66  Resp: 17 20 18 18   Temp:  98 F (36.7 C) 97.8 F (36.6 C) (!) 97.4 F (36.3 C)  TempSrc:  Oral Oral Oral  SpO2: 94% 95% 94% 100%  Weight:   73.6 kg   Height:        Intake/Output Summary (Last 24 hours) at 07/22/2024 1223 Last data filed at 07/22/2024 0900 Gross per 24 hour  Intake 940 ml  Output --  Net 940 ml   Filed Weights   07/20/24 0500 07/21/24 0500 07/22/24 0604  Weight: 69.9 kg 73 kg 73.6 kg    Examination:   General: Appearance:    Obese female in no acute distress     Lungs:    respirations unlabored  Heart:    Normal heart rate.  MS:   All extremities are intact.   Neurologic:   Awake, alert       Data Reviewed: I have personally reviewed following labs and imaging studies  CBC: Recent Labs  Lab 07/18/24 1005 07/19/24 0432 07/20/24 0508 07/21/24 0242  WBC 7.6 6.9 8.2 8.1  HGB 11.6* 10.8* 10.7* 10.8*  HCT 36.2 33.2* 33.1* 33.3*  MCV 82.8 81.6 81.9 81.8  PLT 247 220 237 236   Basic Metabolic Panel: Recent Labs  Lab 07/18/24 1005  07/19/24 0432 07/20/24 0508 07/21/24 0242  NA 139 140 138 139  K 3.8 3.7 3.8 3.8  CL 107 105 102 102  CO2 23 25 27 28   GLUCOSE 107* 99 99 97  BUN 17 12 13 15   CREATININE 0.61 0.70 0.79 0.88  CALCIUM  8.7* 8.5* 9.4 8.7*  MG  --  2.0 1.9  --   PHOS  --   --  5.4*  --    GFR: Estimated Creatinine Clearance: 51 mL/min (by C-G formula based on SCr of 0.88 mg/dL). Liver Function Tests: Recent Labs  Lab 07/18/24 1005 07/19/24 0432  AST 18 16  ALT 16 13  ALKPHOS 76 63  BILITOT 0.5 0.6  PROT 6.1* 5.4*  ALBUMIN  3.2* 2.7*   Recent Labs  Lab 07/18/24 1005  LIPASE 38   No results for input(s): AMMONIA in the last 168 hours. Coagulation Profile: Recent Labs  Lab 07/19/24 0432  INR 1.0   Cardiac Enzymes: No results for input(s): CKTOTAL, CKMB, CKMBINDEX, TROPONINI in the last 168 hours. BNP (last 3 results) No results for input(s): PROBNP in the last 8760 hours. HbA1C: No results for input(s): HGBA1C in the last 72 hours. CBG: No results for input(s): GLUCAP in the last 168 hours. Lipid Profile: No results for input(s): CHOL, HDL, LDLCALC, TRIG, CHOLHDL, LDLDIRECT in the last 72 hours. Thyroid  Function Tests: No results for input(s): TSH, T4TOTAL, FREET4, T3FREE, THYROIDAB in the last 72 hours. Anemia Panel: No results for input(s): VITAMINB12, FOLATE, FERRITIN, TIBC, IRON, RETICCTPCT in the last 72 hours. Sepsis Labs: No results for input(s): PROCALCITON, LATICACIDVEN in the last 168 hours.  Recent Results (from the past 240 hours)  Blood culture (routine x 2)     Status: None (Preliminary result)   Collection Time: 07/18/24  4:05 PM   Specimen: BLOOD  Result Value Ref Range Status   Specimen Description BLOOD SITE NOT SPECIFIED  Final   Special Requests   Final    BOTTLES DRAWN AEROBIC AND ANAEROBIC Blood Culture adequate volume   Culture   Final    NO GROWTH 4 DAYS Performed at [facility], [address], [place], [place] [address]    Report Status PENDING  Incomplete  Blood culture (routine x 2)     Status: None (Preliminary result)   Collection Time: 07/18/24  4:09 PM   Specimen: BLOOD RIGHT HAND  Result Value Ref Range Status   Specimen Description BLOOD RIGHT HAND  Final   Special Requests   Final    BOTTLES DRAWN AEROBIC AND ANAEROBIC Blood Culture results may not be optimal due to an inadequate volume of blood received in culture bottles   Culture   Final    NO GROWTH 4 DAYS Performed at [facility], [address] [place], [place] [address]    Report Status PENDING  Incomplete  Aerobic/Anaerobic Culture w Gram Stain (surgical/deep wound)     Status: None (Preliminary result)   Collection Time:  07/19/24  9:09 AM   Specimen: Abscess  Result Value Ref Range Status   Specimen Description ABSCESS  Final   Special Requests NONE  Final   Gram Stain   Final    ABUNDANT WBC PRESENT, PREDOMINANTLY PMN NO ORGANISMS SEEN    Culture   Final    RARE STREPTOCOCCUS CONSTELLATUS FEW BACTEROIDES THETAIOTAOMICRON FEW PHOCAEICOLA VULGATUS BETA LACTAMASE POSITIVE RARE ENTEROCLOSTER CLOSTRIDIOFORMIS Standardized susceptibility testing for this organism is not available. SUSCEPTIBILITIES TO FOLLOW Performed at [facility], [address], [place], [place] [address]    Report Status PENDING  Incomplete    Radiology Studies: No results found.   Scheduled Meds:  acidophilus  2 capsule Oral Daily   amitriptyline   10 mg Oral QHS   atorvastatin   10 mg Oral q AM   calcium -vitamin D   1 tablet Oral Q breakfast   metoprolol  tartrate  25 mg Oral BID   sodium chloride  flush  3 mL Intravenous Q12H   Continuous Infusions:  piperacillin -tazobactam (ZOSYN )  IV 3.375 g (07/22/24 0910)     LOS: 4 days    Time spent: 50 mins    Harlene RAYMOND Bowl, DO [facility]   If 7PM-7AM, please contact night-coverage

## 2024-07-22 NOTE — Plan of Care (Addendum)
 IV abx continued.  Tolerates soft diet. Denies pain, n/v.  All needs met. Call bell within reach. Educated to call for assistance.  Pt ad lib    Problem: Education: Goal: Knowledge of General Education information will improve Description: Including pain rating scale, medication(s)/side effects and non-pharmacologic comfort measures Outcome: Progressing   Problem: Health Behavior/Discharge Planning: Goal: Ability to manage health-related needs will improve Outcome: Progressing   Problem: Clinical Measurements: Goal: Ability to maintain clinical measurements within normal limits will improve Outcome: Progressing Goal: Will remain free from infection Outcome: Progressing Goal: Diagnostic test results will improve Outcome: Progressing Goal: Respiratory complications will improve Outcome: Progressing Goal: Cardiovascular complication will be avoided Outcome: Progressing

## 2024-07-23 ENCOUNTER — Other Ambulatory Visit (HOSPITAL_COMMUNITY): Payer: Self-pay

## 2024-07-23 DIAGNOSIS — K651 Peritoneal abscess: Secondary | ICD-10-CM | POA: Diagnosis not present

## 2024-07-23 LAB — CULTURE, BLOOD (ROUTINE X 2)
Culture: NO GROWTH
Culture: NO GROWTH
Special Requests: ADEQUATE

## 2024-07-23 LAB — AEROBIC/ANAEROBIC CULTURE W GRAM STAIN (SURGICAL/DEEP WOUND)

## 2024-07-23 MED ORDER — AMOXICILLIN-POT CLAVULANATE 875-125 MG PO TABS
1.0000 | ORAL_TABLET | Freq: Two times a day (BID) | ORAL | 0 refills | Status: AC
  Filled 2024-07-23: qty 32, 16d supply, fill #0

## 2024-07-23 MED ORDER — AMOXICILLIN-POT CLAVULANATE 875-125 MG PO TABS
1.0000 | ORAL_TABLET | Freq: Two times a day (BID) | ORAL | Status: DC
  Administered 2024-07-23: 1 via ORAL
  Filled 2024-07-23: qty 1

## 2024-07-23 MED ORDER — AMOXICILLIN-POT CLAVULANATE 875-125 MG PO TABS: 1.0000 | ORAL_TABLET | Freq: Two times a day (BID) | ORAL | 0 refills | Status: DC

## 2024-07-23 NOTE — Discharge Instructions (Signed)
 Take the 2 weeks of abx.  If you develop worsening pain/fever once off abx-- call the surgery office and they will instruct you about resuming the abx

## 2024-07-23 NOTE — Discharge Summary (Signed)
 Physician Discharge Summary  Meghan Avila FMW:[id number] DOB: 06/08/1951 DOA: 07/18/2024  PCP: Cleotilde Planas, MD  Admit date: 07/18/2024 Discharge date: 07/23/2024  Admitted From:  Discharge disposition: Home   Recommendations for Outpatient Follow-Up:   Patient to finish 2 weeks of antibiotics-once antibiotics complete, she is to notify general surgery office if she has any fevers or worsening abdominal pain and the course of antibiotics may be extended Outpatient follow-up with colorectal surgery arranged by general surgery Urology follow-up   Discharge Diagnosis:   Principal Problem:   Abdominal pain    Discharge Condition: Improved.  Diet recommendation: Low sodium, heart healthy  Wound care: None.  Code status: Full.   History of Present Illness:   Patient is a 73 years old female with past medical history of GERD, peripheral vascular disease, perforated colon in March status post exploratory laparotomy and colorrhaphy on 12/07/2023 presented to the hospital with abdominal pain.  Patient stated that she had been having some pain in the right lower quadrant underneath one of her postoperative drain site that was removed in the past.  Complains of  worse shooting pain when standing and better when lying down down in bed without movement.  Denies any fever, chills, nausea, vomiting, diarrhea or constipation.  Has had good appetite.  Denies any urinary urgency frequency or dysuria but has been having some brownish vaginal discharge with intermittent bleeding which has been going on for months now.  She had gone to her urologist who has been watching her on the symptoms.     In the ED, patient had stable vitals.  Labs were notable for no leukocytosis.  Creatinine of 0.6.  Urinalysis with more than 50 white cells.  CT scan of the abdomen and pelvis showed sigmoid diverticulitis with tract extending inferiorly along the posterior inferior bladder and small abscess  posterior to the bladder with 11 mm lesion in the lateral interpolar right kidney.  From the ED, interventional radiology, General Surgery  were notified.  IR was consulted for possible aspiration of the collection and patient was therefore admitted to the hospital for further evaluation and treatment.   Hospital Course by Problem:   Right lower abdominal pain: Intra-abdominal abscess; Patient is status post exploratory laparotomy and colectomy in the past for perforated colon.   CTA/P showed collection in the pelvis.   Patient underwent IR guided sampling /aspiration of the collection 07/19/24 .--Culture pending General surgery and IR: Recommend at least a 14 day course of antibiotics at discharge. Will be arranging follow up with colorectal surgeon in outpatient setting for her.   Continue IV Zosyn  for intra-abdominal infection. - pain control with oxycodone . -Per urology: I favor culture directed abx for several weeks and repeat CT scan. Would consider transvaginal approach to draining infection if conservative measures are unsuccessful.   - Final culture resulted and patient will be discharged on Augmentin     Vaginal bleeding: She reports Intermittent vaginal bleeding with brownish discharge at times.   It is going on for several months now.  Has had follow-up with urology as outpatient with Dr. Cam, [facility] as well as gynecology.  Conservative treatment underway at this time.       Pyuria:  Asymptomatic.   Denies any dysuria urgency frequency.  Patient has been started on Zosyn .  And will be transition to Augmentin   History of migraine headache: She takes Excedrin  and amitriptyline  at home.  Resumed   Hyperlipidemia: Continue Lipitor.  History of PVCs: Continue metoprolol .    Medical Consultants:   General Surgery Urology   Discharge Exam:   Vitals:   07/23/24 0601 07/23/24 0834  BP: (!) 141/68 (!) 144/70  Pulse: (!) 55 (!) 44  Resp:  14  Temp: 98.4  F (36.9 C) 97.8 F (36.6 C)  SpO2: 96% 98%   Vitals:   07/22/24 1033 07/22/24 2128 07/23/24 0601 07/23/24 0834  BP: 135/71 123/78 (!) 141/68 (!) 144/70  Pulse: 66 74 (!) 55 (!) 44  Resp: 18   14  Temp: (!) 97.4 F (36.3 C) 97.9 F (36.6 C) 98.4 F (36.9 C) 97.8 F (36.6 C)  TempSrc: Oral Oral Oral   SpO2: 100% 97% 96% 98%  Weight:      Height:        General exam: Appears calm and comfortable.     The results of significant diagnostics from this hospitalization (including imaging, microbiology, ancillary and laboratory) are listed below for reference.     Procedures and Diagnostic Studies:   CT ABDOMEN PELVIS W CONTRAST Result Date: 07/19/2024 EXAM: CT ABDOMEN AND PELVIS WITH CONTRAST 07/19/2024 08:48:35 PM TECHNIQUE: CT of the abdomen and pelvis was performed with the administration of 75 mL of intravenous iohexol  (OMNIPAQUE ) 350 mg/mL. Following initial imaging, the patient was brought back for administration of 50 mL of rectal iohexol  (OMNIPAQUE ) 300 mg/mL, and a second set of images was obtained without additional intravenous contrast. Post-void imaging was performed with the use of gravity to help evacuation. A final post-evacuation picture was obtained after the patient used the bathroom. Multiplanar reformatted images are provided for review. Automated exposure control, iterative reconstruction, and/or weight-based adjustment of the mA/kV was utilized to reduce the radiation dose to as low as reasonably achievable. COMPARISON: None available. CLINICAL HISTORY: Evaluate for possible fistula. 75 mL omni 350 (IV); 50 mL omni 300 (rectal); CT ABDOMEN PELVIS W CONTRAST; Evaluate for possible fistula; Pt was scanned with IV contrast, then brought back down for rectal contrast, due to concern of fistula. Spoke with Dr. Krisman who said second set of pictures are ok without IV contrast; Pt was scanned after the insertion of rectal tubing and rectal contrast. Post void was included  with the use of gravity to help the evacuation process (placed bag on the ground and allowed to drain), after post void scan found a decent amount of contrast to still be within the intestines. Pt was unable to relax fully due to concern of having an accident, offered the pt the opportunity to use the bathroom and then return to the room for a final post evacuation picture. Evaluate for possible fistula. FINDINGS: LOWER CHEST: No acute abnormality. LIVER: The liver is unremarkable. GALLBLADDER AND BILE DUCTS: Status post cholecystectomy. No biliary ductal dilatation. SPLEEN: No acute abnormality. PANCREAS: No acute abnormality. ADRENAL GLANDS: No acute abnormality. KIDNEYS, URETERS AND BLADDER: Small bilateral renal cysts. Dominant 6.9 cm simple right lower pole renal cyst, benign. Per consensus, no follow-up is needed for simple Bosniak type 1 and 2 renal cysts, unless the patient has a malignancy history or risk factors. No stones in the kidneys or ureters. No hydronephrosis. No perinephric or periureteral stranding. Bladder is underdistended and poorly evaluated on CT. No intraluminal gas within the bladder to suggest a patent fistula. GI AND BOWEL: Stomach demonstrates no acute abnormality. Sigmoid diverticulosis without acute superimposed inflammatory changes to suggest diverticulitis. Suspected fistulous communication between the sigmoid colon and the posterior aspect of the bladder / vaginal  apex (sagittal image 93) with residual 1.5 x 2.1 cm abscess (image 64). This collection is favored to be more closely related to the vaginal apex rather than the bladder, but there is associated posterior bladder wall thickening on prior MR. Following rectal contrast administration, there is no spillage of contrast into the collection to confirm fistula patency. Normal appendix (image 48). There is no bowel obstruction. PERITONEUM AND RETROPERITONEUM: No ascites. No free air. VASCULATURE: Aorta is normal in caliber. LYMPH  NODES: No lymphadenopathy. REPRODUCTIVE ORGANS: Status post hysterectomy. See above for findings associated with the vaginal apex. BONES AND SOFT TISSUES: Grade 2 spondylolisthesis at L5-S1. Degenerative changes of the visualized thoracolumbar spine. No acute osseous abnormality. No focal soft tissue abnormality. IMPRESSION: 1. Suspected fistulous communication between the sigmoid colon and posterior bladder/vaginal apex with a residual 1.5 x 2.1 cm abscess. This is favored to be more closely associated with the vaginal apex, noting posterior bladder wall thickening on prior MR. 2. No findings to confirm fistula patency. 3. Sigmoid diverticulosis, without evidence of acute diverticulitis. Electronically signed by: Pinkie Pebbles MD 07/19/2024 09:18 PM EDT RP Workstation: [id number]   CT GUIDED NEEDLE PLACEMENT Result Date: 07/19/2024 INDICATION: [id number] Intra-abdominal fluid collection [id number]. DIVERTICULAR ABSCESS. EXAM: CT-GUIDED DIVERTICULAR ABSCESS ASPIRATION COMPARISON:  CT AP, 07/18/2024. MEDICATIONS: The patient is currently admitted to the hospital and receiving intravenous antibiotics. The antibiotics were administered within an appropriate time frame prior to the initiation of the procedure. ANESTHESIA/SEDATION: Moderate (conscious) sedation was employed during this procedure. A total of Versed  2 mg and Fentanyl  100 mcg was administered intravenously. Moderate Sedation Time: 17 minutes. The patient's level of consciousness and vital signs were monitored continuously by radiology nursing throughout the procedure under my direct supervision. CONTRAST:  None FLUOROSCOPY TIME:  CT dose; 384 mGycm COMPLICATIONS: None immediate. PROCEDURE: RADIATION DOSE REDUCTION: This exam was performed according to the departmental dose-optimization program which includes automated exposure control, adjustment of the mA and/or kV according to patient size and/or use of iterative reconstruction technique. Informed written  consent was obtained from the patient after a discussion of the risks, benefits and alternatives to treatment. The patient was placed prone on the CT gantry and a pre procedural CT was performed re-demonstrating the small abscess/fluid collection within the deep pelvis. The procedure was planned. A timeout was performed prior to the initiation of the procedure. The RIGHT gluteus was prepped and draped in the usual sterile fashion. The overlying soft tissues were anesthetized with 1% lidocaine  with epinephrine . Appropriate trajectory was planned with the use of a 22 gauge spinal needle. An 18 gauge trocar needle was advanced into the abscess/fluid collection then aspiration was performed. 2 mL of serosanguineous fluid was aspirated. The needle was removed. A dressing was placed. The patient tolerated the procedure well without immediate post procedural complication. IMPRESSION: Successful CT-guided diagnostic aspiration of small pelvic diverticular abscess with aspiration of 2 mL of serosanguineous fluid. Samples were sent to the laboratory as requested by the ordering clinical team. Thom Hall, MD [facility] Electronically Signed   By: Thom Hall M.D.   On: 07/19/2024 13:00   CT ABDOMEN PELVIS W CONTRAST Result Date: 07/18/2024 CLINICAL DATA:  Right lower quadrant pain, abnormal pelvic MRI. EXAM: CT ABDOMEN AND PELVIS WITH CONTRAST TECHNIQUE: Multidetector CT imaging of the abdomen and pelvis was performed using the standard protocol following bolus administration of intravenous contrast. RADIATION DOSE REDUCTION: This exam was performed according to the departmental dose-optimization program which  includes automated exposure control, adjustment of the mA and/or kV according to patient size and/or use of iterative reconstruction technique. CONTRAST:  75mL OMNIPAQUE  IOHEXOL  350 MG/ML SOLN COMPARISON:  Pelvic MRI 07/13/2024 and CT abdomen pelvis  12/07/2023. FINDINGS: Lower chest: No acute findings. Heart is at the upper limits of normal in size. No pericardial or pleural effusion. Distal esophagus is grossly unremarkable. Hepatobiliary: Liver is unremarkable. Cholecystectomy with associated biliary ductal dilatation. Pancreas: Negative. Spleen: Negative. Adrenals/Urinary Tract: Adrenal glands are unremarkable. 11 mm lesion in the lateral interpolar right kidney measures 74 Hounsfield units. 6.7 cm cyst off the lower pole right kidney. Subcentimeter low-attenuation lesion in the lower pole left kidney, too small to characterize. Kidneys are otherwise unremarkable. Ureters are decompressed. There may be posterior bladder wall thickening secondary to an adjacent inflammatory process to be discussed below. Stomach/Bowel: Tiny hiatal hernia. Stomach, small bowel, appendix and majority of the colon are unremarkable. Sigmoid diverticulosis with very mild adjacent haziness and stranding, best seen on coronal imaging. There is a tract that extends from the sigmoid colon, inferiorly to a thick-walled fluid collection along the right paramidline posterior bladder, measuring approximately 2.5 x 2.5 cm (coronal image 94). Associated thickening of the posterior bladder wall with possible extension along the ventral pelvic floor (2/60 7-71). Rectal anastomosis. Vascular/Lymphatic: Vascular structures are unremarkable. No pathologically enlarged lymph nodes. Reproductive: No adnexal Mass. Other: No free fluid. Mesenteries and peritoneum are otherwise unremarkable. Musculoskeletal: Degenerative changes in the spine. Levoconvex scoliosis. Unilateral pars defect at L5 with grade 2 anterolisthesis. IMPRESSION: 1. Residua of sigmoid diverticulitis with a tract extending inferiorly along the posteroinferior bladder and a small abscess posterior to the bladder. 2. 11 mm lesion in the lateral interpolar right kidney measures 74 Hounsfield units. Lesion is too small to properly  characterize. Further evaluation with pre and post contrast MRI should be considered. Pre and post contrast CT could alternatively be performed, but would likely be of decreased accuracy given lesion size. Electronically Signed   By: Newell Eke M.D.   On: 07/18/2024 14:09     Labs:   Basic Metabolic Panel: Recent Labs  Lab 07/18/24 1005 07/19/24 0432 07/20/24 0508 07/21/24 0242  NA 139 140 138 139  K 3.8 3.7 3.8 3.8  CL 107 105 102 102  CO2 23 25 27 28   GLUCOSE 107* 99 99 97  BUN 17 12 13 15   CREATININE 0.61 0.70 0.79 0.88  CALCIUM  8.7* 8.5* 9.4 8.7*  MG  --  2.0 1.9  --   PHOS  --   --  5.4*  --    GFR Estimated Creatinine Clearance: 51 mL/min (by C-G formula based on SCr of 0.88 mg/dL). Liver Function Tests: Recent Labs  Lab 07/18/24 1005 07/19/24 0432  AST 18 16  ALT 16 13  ALKPHOS 76 63  BILITOT 0.5 0.6  PROT 6.1* 5.4*  ALBUMIN  3.2* 2.7*   Recent Labs  Lab 07/18/24 1005  LIPASE 38   No results for input(s): AMMONIA in the last 168 hours. Coagulation profile Recent Labs  Lab 07/19/24 0432  INR 1.0    CBC: Recent Labs  Lab 07/18/24 1005 07/19/24 0432 07/20/24 0508 07/21/24 0242  WBC 7.6 6.9 8.2 8.1  HGB 11.6* 10.8* 10.7* 10.8*  HCT 36.2 33.2* 33.1* 33.3*  MCV 82.8 81.6 81.9 81.8  PLT 247 220 237 236   Cardiac Enzymes: No results for input(s): CKTOTAL, CKMB, CKMBINDEX, TROPONINI in the last 168 hours. BNP: Invalid input(s): POCBNP CBG: No  results for input(s): GLUCAP in the last 168 hours. D-Dimer No results for input(s): DDIMER in the last 72 hours. Hgb A1c No results for input(s): HGBA1C in the last 72 hours. Lipid Profile No results for input(s): CHOL, HDL, LDLCALC, TRIG, CHOLHDL, LDLDIRECT in the last 72 hours. Thyroid  function studies No results for input(s): TSH, T4TOTAL, T3FREE, THYROIDAB in the last 72 hours.  Invalid input(s): FREET3 Anemia work up No results for input(s):  VITAMINB12, FOLATE, FERRITIN, TIBC, IRON, RETICCTPCT in the last 72 hours. Microbiology Recent Results (from the past 240 hours)  Blood culture (routine x 2)     Status: None   Collection Time: 07/18/24  4:05 PM   Specimen: BLOOD  Result Value Ref Range Status   Specimen Description BLOOD SITE NOT SPECIFIED  Final   Special Requests   Final    BOTTLES DRAWN AEROBIC AND ANAEROBIC Blood Culture adequate volume   Culture   Final    NO GROWTH 5 DAYS Performed at [facility], [address] [place], [place] [address]    Report Status 07/23/2024 FINAL  Final  Blood culture (routine x 2)     Status: None   Collection Time: 07/18/24  4:09 PM   Specimen: BLOOD RIGHT HAND  Result Value Ref Range Status   Specimen Description BLOOD RIGHT HAND  Final   Special Requests   Final    BOTTLES DRAWN AEROBIC AND ANAEROBIC Blood Culture results may not be optimal due to an inadequate volume of blood received in culture bottles   Culture   Final    NO GROWTH 5 DAYS Performed at [facility], [address], [place], [place] [address]    Report Status 07/23/2024 FINAL  Final  Aerobic/Anaerobic Culture w Gram Stain (surgical/deep wound)     Status: None   Collection Time: 07/19/24  9:09 AM   Specimen: Abscess  Result Value Ref Range Status   Specimen Description ABSCESS  Final   Special Requests NONE  Final   Gram Stain   Final    ABUNDANT WBC PRESENT, PREDOMINANTLY PMN NO ORGANISMS SEEN    Culture   Final    RARE STREPTOCOCCUS CONSTELLATUS FEW BACTEROIDES THETAIOTAOMICRON FEW PHOCAEICOLA VULGATUS BETA LACTAMASE POSITIVE RARE ENTEROCLOSTER CLOSTRIDIOFORMIS Standardized susceptibility testing for this organism is not available. Performed at [facility], [address] [place], [place] [address]    Report Status 07/23/2024 FINAL  Final   Organism ID, Bacteria STREPTOCOCCUS CONSTELLATUS  Final      Susceptibility   Streptococcus constellatus - MIC*     PENICILLIN <=0.06 SENSITIVE Sensitive     CEFTRIAXONE 0.25 SENSITIVE Sensitive     ERYTHROMYCIN >=8 RESISTANT Resistant     LEVOFLOXACIN <=0.25 SENSITIVE Sensitive     VANCOMYCIN 0.25 SENSITIVE Sensitive     * RARE STREPTOCOCCUS CONSTELLATUS     Discharge Instructions:   Discharge Instructions     Diet general   Complete by: As directed    Discharge instructions   Complete by: As directed    2 weeks ABX and follow up with colorectal surgery   Increase activity slowly   Complete by: As directed    No wound care   Complete by: As directed       Allergies as of 07/23/2024       Reactions   Zantac [ranitidine Hcl] Anaphylaxis, Swelling   Alendronate Diarrhea   Burping   Morphine  And Codeine Nausea And Vomiting   Morphine , Hydrocodone  Tolerates  Dilaudid , Fentanyl  per Epic   Prednisone Diarrhea, Nausea And Vomiting   Tramadol Nausea And Vomiting   AMS        Medication List     TAKE these medications    amitriptyline  10 MG tablet Commonly known as: ELAVIL  TAKE 1 TABLET BY MOUTH EVERYDAY AT BEDTIME   amoxicillin-clavulanate 875-125 MG tablet Commonly known as: AUGMENTIN Take 1 tablet by mouth every 12 (twelve) hours.   aspirin -acetaminophen -caffeine  250-250-65 MG tablet Commonly known as: EXCEDRIN  MIGRAINE Take 1 tablet by mouth as needed for headache or migraine.   atorvastatin  10 MG tablet Commonly known as: LIPITOR Take 10 mg by mouth in the morning.   Calcium  Carb-Cholecalciferol 600-20 MG-MCG Tabs Take 1 tablet by mouth daily at 12 noon.   diclofenac sodium 1 % Gel Commonly known as: VOLTAREN Apply 1 application  topically daily as needed (For knee and shoulder pain).   metoprolol  tartrate 25 MG tablet Commonly known as: LOPRESSOR  TAKE 1 TABLET BY MOUTH TWICE A DAY   multivitamin with minerals tablet Take 1 tablet by mouth daily.   oxyCODONE  5 MG immediate release tablet Commonly known as: Oxy IR/ROXICODONE  Take 1 tablet (5 mg total) by  mouth every 4 (four) hours as needed for moderate pain (pain score 4-6).   PROBIOTIC PO Take 1 tablet by mouth daily.   Prolia  60 MG/ML Sosy injection Generic drug: denosumab  Inject 60 mg into the skin every 6 (six) months.   VITAMIN C PO Take 2 tablets by mouth daily.        Follow-up Information     Debby Hila, MD. Go on 08/18/2024.   Specialties: General Surgery, Colon and Rectal Surgery Why: 10:30AM, Arrive 30 mins prior to scheduled appointment time, Please bring your ID and insurance cards. Contact information: [address] [place] [address] [phone]                  Time coordinating discharge: 45 min  Signed:  Harlene RAYMOND Bowl DO  [facility] 07/23/2024, 10:03 AM

## 2024-07-25 ENCOUNTER — Other Ambulatory Visit: Payer: Self-pay | Admitting: Family Medicine

## 2024-07-25 DIAGNOSIS — Z1231 Encounter for screening mammogram for malignant neoplasm of breast: Secondary | ICD-10-CM

## 2024-07-27 DIAGNOSIS — H40013 Open angle with borderline findings, low risk, bilateral: Secondary | ICD-10-CM | POA: Diagnosis not present

## 2024-07-27 DIAGNOSIS — H02834 Dermatochalasis of left upper eyelid: Secondary | ICD-10-CM | POA: Diagnosis not present

## 2024-07-27 DIAGNOSIS — H2513 Age-related nuclear cataract, bilateral: Secondary | ICD-10-CM | POA: Diagnosis not present

## 2024-07-27 DIAGNOSIS — H43812 Vitreous degeneration, left eye: Secondary | ICD-10-CM | POA: Diagnosis not present

## 2024-08-01 DIAGNOSIS — K5720 Diverticulitis of large intestine with perforation and abscess without bleeding: Secondary | ICD-10-CM | POA: Diagnosis not present

## 2024-08-01 DIAGNOSIS — Z6830 Body mass index (BMI) 30.0-30.9, adult: Secondary | ICD-10-CM | POA: Diagnosis not present

## 2024-08-01 DIAGNOSIS — Z23 Encounter for immunization: Secondary | ICD-10-CM | POA: Diagnosis not present

## 2024-08-08 DIAGNOSIS — M25511 Pain in right shoulder: Secondary | ICD-10-CM | POA: Diagnosis not present

## 2024-08-18 ENCOUNTER — Other Ambulatory Visit: Payer: Self-pay | Admitting: General Surgery

## 2024-08-18 DIAGNOSIS — K5732 Diverticulitis of large intestine without perforation or abscess without bleeding: Secondary | ICD-10-CM | POA: Diagnosis not present

## 2024-08-22 DIAGNOSIS — Z0000 Encounter for general adult medical examination without abnormal findings: Secondary | ICD-10-CM | POA: Diagnosis not present

## 2024-09-08 ENCOUNTER — Ambulatory Visit
Admission: RE | Admit: 2024-09-08 | Discharge: 2024-09-08 | Disposition: A | Source: Ambulatory Visit | Attending: Family Medicine | Admitting: Family Medicine

## 2024-09-08 DIAGNOSIS — Z1231 Encounter for screening mammogram for malignant neoplasm of breast: Secondary | ICD-10-CM

## 2024-09-23 ENCOUNTER — Inpatient Hospital Stay: Admission: RE | Admit: 2024-09-23 | Discharge: 2024-09-23 | Attending: General Surgery | Admitting: General Surgery

## 2024-09-23 ENCOUNTER — Encounter: Payer: Self-pay | Admitting: Radiology

## 2024-09-23 DIAGNOSIS — K5732 Diverticulitis of large intestine without perforation or abscess without bleeding: Secondary | ICD-10-CM

## 2024-09-23 MED ORDER — IOPAMIDOL (ISOVUE-300) INJECTION 61%
100.0000 mL | Freq: Once | INTRAVENOUS | Status: AC | PRN
  Administered 2024-09-23: 100 mL via INTRAVENOUS

## 2024-10-12 ENCOUNTER — Telehealth: Payer: Self-pay | Admitting: Neurology

## 2024-10-12 ENCOUNTER — Encounter: Payer: Self-pay | Admitting: Cardiovascular Disease

## 2024-10-12 MED ORDER — AMITRIPTYLINE HCL 10 MG PO TABS: 10.0000 mg | ORAL_TABLET | Freq: Every day | ORAL | 1 refills | Status: AC

## 2024-10-12 NOTE — Telephone Encounter (Signed)
 Pt called to request medication refill  amitriptyline  (ELAVIL ) 10 MG tablet  Pt medication is to be sent    [id number] - [place], [place] - [address] AT [facility] [address] (Ph: [phone])

## 2024-11-15 ENCOUNTER — Ambulatory Visit: Admitting: Cardiovascular Disease

## 2025-02-23 ENCOUNTER — Ambulatory Visit: Admitting: Neurology

## 2025-02-23 ENCOUNTER — Ambulatory Visit: Admitting: Family Medicine
